# Patient Record
Sex: Male | Born: 1965 | Race: White | Hispanic: No | Smoking: Never smoker
Health system: Southern US, Community
[De-identification: ages and names within clinical notes are randomized; demographics above are authoritative.]

## PROBLEM LIST (undated history)

## (undated) DIAGNOSIS — B1920 Unspecified viral hepatitis C without hepatic coma: Secondary | ICD-10-CM

## (undated) DIAGNOSIS — I10 Essential (primary) hypertension: Secondary | ICD-10-CM

## (undated) DIAGNOSIS — K7460 Unspecified cirrhosis of liver: Secondary | ICD-10-CM

## (undated) DIAGNOSIS — D3A8 Other benign neuroendocrine tumors: Secondary | ICD-10-CM

## (undated) HISTORY — DX: Unspecified viral hepatitis C without hepatic coma: B19.20

## (undated) HISTORY — DX: Unspecified cirrhosis of liver: K74.60

## (undated) HISTORY — PX: OTHER SURGICAL HISTORY: SHX169

## (undated) HISTORY — DX: Essential (primary) hypertension: I10

## (undated) HISTORY — DX: Other benign neuroendocrine tumors: D3A.8

---

## 2013-04-19 ENCOUNTER — Encounter (INDEPENDENT_AMBULATORY_CARE_PROVIDER_SITE_OTHER): Payer: Self-pay

## 2013-04-19 ENCOUNTER — Encounter: Payer: Self-pay | Admitting: *Deleted

## 2013-04-19 ENCOUNTER — Ambulatory Visit (INDEPENDENT_AMBULATORY_CARE_PROVIDER_SITE_OTHER): Payer: 59 | Admitting: Cardiovascular Disease

## 2013-04-19 VITALS — BP 143/100 | HR 97 | Ht 75.0 in | Wt 372.0 lb

## 2013-04-19 DIAGNOSIS — R079 Chest pain, unspecified: Secondary | ICD-10-CM

## 2013-04-19 DIAGNOSIS — Z139 Encounter for screening, unspecified: Secondary | ICD-10-CM

## 2013-04-19 DIAGNOSIS — Z833 Family history of diabetes mellitus: Secondary | ICD-10-CM

## 2013-04-19 DIAGNOSIS — I10 Essential (primary) hypertension: Secondary | ICD-10-CM

## 2013-04-19 MED ORDER — LISINOPRIL 10 MG PO TABS: 10.0000 mg | ORAL_TABLET | Freq: Every day | ORAL | Status: DC

## 2013-04-19 NOTE — Progress Notes (Signed)
Patient ID: Nicolas [name], male   DOB: 09/19/1965, 48 y.o.   MRN: [id number]       CARDIOLOGY CONSULT NOTE  Patient ID: Nicolas [name] MRN: [id number] DOB/AGE: 20-Aug-1965 48 y.o.  Admit date: (Not on file) Primary Physician Narda Bonds, MD  Reason for Consultation: chest pain, HTN  HPI: The patient is a 48 year old male who presents for the evaluation of chest pain. He was evaluated at an urgent care center in [place], [place]. An ECG there showed normal sinus rhythm at a rate of 71 beats per minute with no acute ST segment or T-wave abnormalities. His chest pain was right sided and located specifically in the right inframammary and right axillary region. It occurred after lifting 5 lb dumbbells repetitively. He previously used to weigh over 400 pounds and after changing his diet and exercising, he is now down to 372 pounds. He has never experienced chest pain prior to this. He denies shortness of breath, palpitations, leg swelling and syncope. He occasionally gets lightheaded if his blood pressure is high. He was given a nitroglycerin patch at the urgent care which gave him headaches. He had numerous labs checked with results as follows:  WBC 8.6 Hgb 15.9 Plts 235 Albumin 3.6 K 4.2 BUN 15 Creatinine 0.79 BNP 13 Troponin .01 D dimer 0.31 AST 60 ALT 70  He was given a prescription for lisinopril 10 mg daily but has yet to fill it. He does not have a primary care provider.  Soc: has 3 daughters and 1 son. Denies cigarette smoking but uses snuff. Works as a Building control surveyor.  No Known Allergies  Current Outpatient Prescriptions  Medication Sig Dispense Refill  . aspirin EC 81 MG tablet Take 81 mg by mouth as needed.       No current facility-administered medications for this visit.    Past Medical History  Diagnosis Date  . HTN (hypertension)     No past surgical history on file.  History   Social History  . Marital Status: Divorced    Spouse Name: N/A   Number of Children: N/A  . Years of Education: N/A   Occupational History  . Not on file.   Social History Main Topics  . Smoking status: Never Smoker   . Smokeless tobacco: Current User    Types: Snuff     Comment: started using snuff since 1986  . Alcohol Use: Not on file  . Drug Use: Not on file  . Sexual Activity: Not on file   Other Topics Concern  . Not on file   Social History Narrative  . No narrative on file     No family history on file.   Prior to Admission medications   Medication Sig Start Date End Date Taking? Authorizing Provider  aspirin EC 81 MG tablet Take 81 mg by mouth as needed.   Yes Historical Provider, MD     Review of systems complete and found to be negative unless listed above in HPI     Physical exam Blood pressure 143/100, pulse 97, height 6\' 3"  (1.905 m), weight 372 lb (168.738 kg), SpO2 97.00%. General: NAD, obese Neck: No JVD, no thyromegaly or thyroid nodule.  Lungs: Clear to auscultation bilaterally with normal respiratory effort. CV: Nondisplaced PMI.  Tenderness to palpation (mild) in right axillary region. Heart regular S1/S2, no S3/S4, no murmur.  No peripheral edema.  No carotid bruit.  Normal pedal pulses.  Abdomen: Soft, nontender, no hepatosplenomegaly, no distention.  Skin: Intact without lesions or  rashes.  Neurologic: Alert and oriented x 3.  Psych: Normal affect. Extremities: No clubbing or cyanosis.  HEENT: Normal.   Labs:   No results found for this basename: WBC, HGB, HCT, MCV, PLT   No results found for this basename: NA, K, CL, CO2, BUN, CREATININE, CALCIUM, LABALBU, PROT, BILITOT, ALKPHOS, ALT, AST, GLUCOSE,  in the last 168 hours No results found for this basename: CKTOTAL, CKMB, CKMBINDEX, TROPONINI    No results found for this basename: CHOL   No results found for this basename: HDL   No results found for this basename: LDLCALC   No results found for this basename: TRIG   No results found for this  basename: CHOLHDL   No results found for this basename: LDLDIRECT       EKG: Sinus rhythm, rate 71 bpm, axis within normal limits, intervals within normal limits, no acute ST-T wave changes, rSR' pattern in V1.  Studies: No results found.  ASSESSMENT AND PLAN:  1. Hypertension: I encouraged him to begin taking lisinopril 10 mg daily as prescribed. I will assess his renal function with a basic metabolic panel in one week. I informed him of possible side effects which may include a dry cough. I have asked the patient to check blood pressure readings 2-3 times per week, at different times throughout the day, in order to get a better approximation of mean BP values. These results will be provided to me at the end of that period so that I can determine if antihypertensive medication titration is indicated (has the ability to check BP at work). 2. Chest pain: noncardiac and musculoskeletal in etiology, specifically triggered by repetitive weight lifting. 3. Screening: I will check an HbA1C given his family history (daughter with elevated blood sugar) and a fasting lipid panel.  Dispo: f/u 1 week.  Signed: Kate Sable, M.D., F.A.C.C.  04/19/2013, 1:37 PM

## 2013-04-19 NOTE — Patient Instructions (Signed)
Continue all current medications. Labs for HgbA1c, Lipid panel, BMET - do in 1 week, around 04/26/2013 Office will contact with results via phone or letter.   Your physician has requested that you regularly monitor and record your blood pressure readings at home. Please check at varied times of the day and bring for MD review at next office visit.  Follow up in  4-6 weeks

## 2013-04-28 ENCOUNTER — Telehealth: Payer: Self-pay | Admitting: Cardiovascular Disease

## 2013-04-28 NOTE — Telephone Encounter (Signed)
Left message to return call 

## 2013-05-01 NOTE — Telephone Encounter (Signed)
Patient states that new medication was making him feel lightheaded, dizzy, & dropping heart rate too low.  Stated he stopped the Lisinopril himself on Friday & is feeling better.  Stated he had a lot more readings, but did not have access to those at work.  Numbers below are what he took at home:  1/14 - 138/78  89 1/15 - 120/78  151 1/16 - 132/78  68 1/19 - 119/72  72

## 2013-05-01 NOTE — Telephone Encounter (Signed)
If experiencing side effects from lisinopril, can try amlodipine 5 mg daily. Ask him to continue to monitor BP.

## 2013-05-03 ENCOUNTER — Encounter: Payer: Self-pay | Admitting: *Deleted

## 2013-05-03 MED ORDER — AMLODIPINE BESYLATE 5 MG PO TABS: 5.0000 mg | ORAL_TABLET | Freq: Every day | ORAL | Status: DC

## 2013-05-03 NOTE — Telephone Encounter (Signed)
Notified girlfriend Debbora Lacrosse).  She will relay message to Joann to see if willing to try something different.

## 2013-05-03 NOTE — Telephone Encounter (Signed)
Received return call - patient is okay with trying new medication.  Will send new medication to pharm.

## 2013-05-10 ENCOUNTER — Telehealth: Payer: Self-pay | Admitting: Cardiovascular Disease

## 2013-05-10 NOTE — Telephone Encounter (Signed)
Discussed with girlfriend.  She describes area being just on his left big toe red, swollen.  Explained to her that these are not typical reactions to Amlodipine.  Suggested that he may need to be seen by urgent care as he does not have PMD.  She states his BP has been fine since he stopped the medicine himself yesterday.  Will send to provider for advice.

## 2013-05-10 NOTE — Telephone Encounter (Signed)
Sounds like gout. Would continue amlodipine as he has documented HTN.

## 2013-05-11 NOTE — Telephone Encounter (Addendum)
Notified girlfriend Mickel Baas).  Advised her to have him keep bp log & bring with him to follow up visit on 2/10 with Dr. Bronson Ing if he chooses not to restart the Amlodipine.  She stated he has concerns that he is going to have stents.  Informed her that per MD last OV, no indication that he needed any further testing at this point.  Informed her that she may want to come with him for next office visit as all these things can be discussed in greater detail with MD at that visit.  She verbalized understanding.

## 2013-05-23 ENCOUNTER — Encounter: Payer: Self-pay | Admitting: Cardiovascular Disease

## 2013-05-23 ENCOUNTER — Encounter: Payer: Self-pay | Admitting: *Deleted

## 2013-05-23 ENCOUNTER — Ambulatory Visit (INDEPENDENT_AMBULATORY_CARE_PROVIDER_SITE_OTHER): Payer: 59 | Admitting: Cardiovascular Disease

## 2013-05-23 VITALS — BP 133/92 | HR 82 | Ht 75.0 in | Wt 361.0 lb

## 2013-05-23 DIAGNOSIS — R079 Chest pain, unspecified: Secondary | ICD-10-CM

## 2013-05-23 DIAGNOSIS — Z139 Encounter for screening, unspecified: Secondary | ICD-10-CM

## 2013-05-23 DIAGNOSIS — Z7182 Exercise counseling: Secondary | ICD-10-CM

## 2013-05-23 DIAGNOSIS — Z833 Family history of diabetes mellitus: Secondary | ICD-10-CM

## 2013-05-23 DIAGNOSIS — I10 Essential (primary) hypertension: Secondary | ICD-10-CM

## 2013-05-23 NOTE — Progress Notes (Signed)
Patient ID: Nicolas [name], male   DOB: May 22, 1965, 48 y.o.   MRN: [id number]      SUBJECTIVE: The patient has a history of hypertension and musculoskeletal chest pain. I initially started him on lisinopril but he began to experience lightheadedness and dizziness and stopped this. I then recommended he start amlodipine 5 mg daily. His basic metabolic panel showed BUN 24 creatinine 0.94 on 04/28/2013. His HbA1c was 5.7%. Lipid panel showed triglycerides 95, total cholesterol 177, HDL 53, LDL 105. Since his last visit, he has gone from 372 pounds to 361 pounds. He walks and lifts dumbbells and does a few situps every other day. He has cut out fast foods altogether and is eating baked foods. He also has been eating a lot of [place]. He denies chest pain and shortness of breath. He did not tolerate amlodipine either, as this caused him to be lightheaded and dizzy. He has been monitoring his blood pressures at home and other than one isolated reading of 149/89, all other readings have been normal.     No Known Allergies  No current outpatient prescriptions on file.   No current facility-administered medications for this visit.    Past Medical History  Diagnosis Date  . HTN (hypertension)     No past surgical history on file.  History   Social History  . Marital Status: Divorced    Spouse Name: N/A    Number of Children: N/A  . Years of Education: N/A   Occupational History  . Not on file.   Social History Main Topics  . Smoking status: Never Smoker   . Smokeless tobacco: Current User    Types: Snuff     Comment: started using snuff since 1986  . Alcohol Use: Not on file  . Drug Use: Not on file  . Sexual Activity: Not on file   Other Topics Concern  . Not on file   Social History Narrative  . No narrative on file     Filed Vitals:   05/23/13 1008  BP: 133/92  Pulse: 82  Height: 6\' 3"  (1.905 m)  Weight: 361 lb (163.749 kg)  SpO2: 96%    PHYSICAL  EXAM General: NAD Neck: No JVD, no thyromegaly or thyroid nodule.  Lungs: Clear to auscultation bilaterally with normal respiratory effort. CV: Nondisplaced PMI.  Heart regular S1/S2, no S3/S4, no murmur.  No peripheral edema.  No carotid bruit.  Normal pedal pulses.  Abdomen: Soft, nontender, no hepatosplenomegaly, no distention.  Neurologic: Alert and oriented x 3.  Psych: Normal affect. Extremities: No clubbing or cyanosis.   ECG: reviewed and available in electronic records.      ASSESSMENT AND PLAN: 1. HTN: Given that he has had only one isolated reading of 149/89 with all other readings being normal, I will hold off on any antihypertensive treatments. I gave him extensive counseling with respect to exercise and dietary strategies to achieve a heart healthy lifestyle. I've asked him to call me in 3 months to let me know how he is doing and to inform me of the progress he has made. 2. Musculoskeletal chest pain: no further recurrences. 3. Labs: HbA1C, renal function, and lipids within normal limits. Continue lifestyle risk factor modification.  Dispo: f/u prn   Kate Sable, M.D., F.A.C.C.

## 2013-05-23 NOTE — Patient Instructions (Signed)
Continue all current medications. Follow up as needed  Back to work noted provided today.

## 2019-07-27 ENCOUNTER — Ambulatory Visit: Payer: Managed Care, Other (non HMO) | Admitting: Nurse Practitioner

## 2019-07-27 NOTE — Progress Notes (Signed)
Referring [facility] Primary Care Physician:  Olin Hauser, MD Primary Gastroenterologist:  Dr. Gala Romney  Chief Complaint  Patient presents with  . Abdominal Pain     RUQ, across lower abd  . abdominal swelling    HPI:   Nicolas [name] is a 54 y.o. male presenting today at the request of [facility] ED for abdominal pain.   Reviewed hospital records dated 07/24/2019: Patient presented with swelling that had developed over the last month in his abdomen, scrotum, and bilateral legs.  CT abdomen and pelvis with liver cirrhosis with splenomegaly, large volume of ascites throughout the abdomen.  Possible mild nodular infiltration of the left upper quadrant mesentery and anterior omentum.  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  Mass is indeterminate for metastatic disease or primary mesenteric mass such as stromal tumor.  Though ascites and appearance of the mesentery could be secondary to cirrhosis, consideration is also given towards peritoneal metastatic disease.  Nonobstructing kidney stones.    CBC with no significant abnormalities.  CMP with glucose slightly elevated at 137, albumin low at 2.9, slight elevation of alk phos at 113, slight elevation of AST of 43, slight elevation of total bilirubin at 1.2. BNP within normal limits.  Lipase normal.  He was started on Lasix 40 mg daily.  Advised to follow-up with GI, oncology, and primary medical doctor.  Today:  States he can't do anything. SOB with minimal activity. Swelling in abdomen, scrotum, and legs began to be very noticeable about 1 month ago. Not sure of his baseline weight before this. States he can't eat very much due to the swelling. Will eat one jello and feel full. Drinking ensure. No nausea or vomiting. Mild abdominal pain in RUQ and RLQ since swelling began. No significant change. BMs daily. No blood in the stool or black stools. Was  taking Aleve daily but stopped this after ED visit. Occasional baby aspirin. Taking Lasix 40 mg daily prescribed by ED. Sometimes swelling in LE will improve, other times not.   No tylenol. No chest pain or heart palpitations. Some cough since swelling. Intermittent dizziness, no pre-syncope or syncope.   Cirrhosis: This is a new diagnosis. Patient was unaware of this. Drinks 12 beer in a year. Used to drink heavily in his 77s. No history of drug use. Sister had cirrhosis but she was on drugs. No known exposures to hepatitis. Has tattoos completed at home at 54 y.o.. No incarceration. No autoimmune conditions in his family. No known family history of alpha-1 antitrypsin deficiency, Wilson's disease, celiac disease, inflammatory bowel disease, thyroid disorders.  No jaundice. No confusion.   No prior colonoscopy.   Patient did not receive referral to oncology.    Past Medical History:  Diagnosis Date  . HTN (hypertension)     History reviewed. No pertinent surgical history.  Current Outpatient Medications  Medication Sig Dispense Refill  . furosemide (LASIX) 40 MG tablet Take 40 mg by mouth daily.    Marland Kitchen spironolactone (ALDACTONE) 100 MG tablet Take 1 tablet (100 mg total) by mouth daily. 30 tablet 3   No current facility-administered medications for this visit.    Allergies as of 07/28/2019  . (No Known Allergies)    Family History  Problem Relation Age of Onset  . Cirrhosis Sister        per patient, due to drug use.     Social History   Socioeconomic History  .  Marital status: Divorced    Spouse name: Not on file  . Number of children: Not on file  . Years of education: Not on file  . Highest education level: Not on file  Occupational History  . Not on file  Tobacco Use  . Smoking status: Never Smoker  . Smokeless tobacco: Current User    Types: Snuff  . Tobacco comment: started using snuff since 1986  Substance and Sexual Activity  . Alcohol use: Yes    Comment:  12 beer a year.   . Drug use: Never  . Sexual activity: Not on file  Other Topics Concern  . Not on file  Social History Narrative  . Not on file   Social Determinants of Health   Financial Resource Strain:   . Difficulty of Paying Living Expenses:   Food Insecurity:   . Worried About Charity fundraiser in the Last Year:   . Arboriculturist in the Last Year:   Transportation Needs:   . Film/video editor (Medical):   Marland Kitchen Lack of Transportation (Non-Medical):   Physical Activity:   . Days of Exercise per Week:   . Minutes of Exercise per Session:   Stress:   . Feeling of Stress :   Social Connections:   . Frequency of Communication with Friends and Family:   . Frequency of Social Gatherings with Friends and Family:   . Attends Religious Services:   . Active Member of Clubs or Organizations:   . Attends Archivist Meetings:   Marland Kitchen Marital Status:   Intimate Partner Violence:   . Fear of Current or Ex-Partner:   . Emotionally Abused:   Marland Kitchen Physically Abused:   . Sexually Abused:     Review of Systems: Gen: Denies any fever, chills, cold or flulike symptoms. CV: Denies chest pain or heart palpitations. Resp: See HPI GI: See HPI GU : Denies urinary burning, urinary frequency, urinary hesitancy  Derm: Denies rash Psych: Denies depression, anxiety Heme: See HPI  Physical Exam: BP (!) 156/98   Pulse 94   Temp 97.8 F (36.6 C) (Oral)   Ht _0  (1.956 m)   Wt (!) 471 lb (213.6 kg)   BMI 55.85 kg/m  General:   Alert and oriented. Pleasant and cooperative. Difficulty ambulating due to swelling with associated SOB.  Head:  Normocephalic and atraumatic. Eyes:  Without icterus, sclera clear and conjunctiva pink.  Ears:  Normal auditory acuity. Lungs:  Clear to auscultation bilaterally. No wheezes, rales, or rhonchi. No distress.  Heart:  S1, S2 present without murmurs appreciated.  Abdomen: Large A/P diameter. Abdomen is quite distended and tense. Also with 1+  pitting edema in the lower abdomen. +BS but diminished. Mild tenderness in the RLQ and RUQ.  No guarding or rebound. No masses appreciated.  Rectal:  Deferred  Msk:  Symmetrical without gross deformities. Normal posture. Extremities:  With 3+ bilateral LE pitting edema up to his knees. 1+ pitting edema in his thighs.  Neurologic:  Alert and  oriented x4;  grossly normal neurologically. Skin:  Intact without significant lesions or rashes. Psych: Normal mood and affect.

## 2019-07-28 ENCOUNTER — Other Ambulatory Visit (HOSPITAL_COMMUNITY)
Admission: RE | Admit: 2019-07-28 | Discharge: 2019-07-28 | Disposition: A | Discharge status: Home / Self Care | Payer: Medicaid - Out of State | Source: Ambulatory Visit | Attending: Gastroenterology | Admitting: Gastroenterology

## 2019-07-28 ENCOUNTER — Telehealth: Payer: Self-pay | Admitting: Gastroenterology

## 2019-07-28 ENCOUNTER — Ambulatory Visit (HOSPITAL_COMMUNITY)
Admission: RE | Admit: 2019-07-28 | Discharge: 2019-07-28 | Disposition: A | Discharge status: Home / Self Care | Payer: Medicaid - Out of State | Source: Ambulatory Visit | Attending: Gastroenterology | Admitting: Gastroenterology

## 2019-07-28 ENCOUNTER — Other Ambulatory Visit: Payer: Self-pay

## 2019-07-28 ENCOUNTER — Encounter: Payer: Self-pay | Admitting: Gastroenterology

## 2019-07-28 ENCOUNTER — Encounter (HOSPITAL_COMMUNITY): Payer: Self-pay

## 2019-07-28 ENCOUNTER — Ambulatory Visit (INDEPENDENT_AMBULATORY_CARE_PROVIDER_SITE_OTHER): Payer: Medicaid - Out of State | Admitting: Gastroenterology

## 2019-07-28 VITALS — BP 156/98 | HR 94 | Temp 97.8°F | Ht 77.0 in | Wt >= 6400 oz

## 2019-07-28 DIAGNOSIS — R188 Other ascites: Secondary | ICD-10-CM | POA: Insufficient documentation

## 2019-07-28 DIAGNOSIS — K6389 Other specified diseases of intestine: Secondary | ICD-10-CM

## 2019-07-28 DIAGNOSIS — K7460 Unspecified cirrhosis of liver: Secondary | ICD-10-CM | POA: Diagnosis not present

## 2019-07-28 LAB — BODY FLUID CELL COUNT WITH DIFFERENTIAL
Eos, Fluid: 0 %
Lymphs, Fluid: 66 %
Monocyte-Macrophage-Serous Fluid: 28 % — ABNORMAL LOW (ref 50–90)
Neutrophil Count, Fluid: 6 % (ref 0–25)
Total Nucleated Cell Count, Fluid: 327 cu mm (ref 0–1000)

## 2019-07-28 LAB — IRON AND TIBC
Iron: 99 ug/dL (ref 45–182)
Saturation Ratios: 41 % — ABNORMAL HIGH (ref 17.9–39.5)
TIBC: 242 ug/dL — ABNORMAL LOW (ref 250–450)
UIBC: 143 ug/dL

## 2019-07-28 LAB — HEPATITIS C ANTIBODY: HCV Ab: REACTIVE — AB

## 2019-07-28 LAB — PROTIME-INR
INR: 1.2 (ref 0.8–1.2)
Prothrombin Time: 14.6 seconds (ref 11.4–15.2)

## 2019-07-28 LAB — GRAM STAIN

## 2019-07-28 LAB — HEPATITIS A ANTIBODY, TOTAL: hep A Total Ab: NONREACTIVE

## 2019-07-28 LAB — HEPATITIS B SURFACE ANTIGEN: Hepatitis B Surface Ag: NONREACTIVE

## 2019-07-28 LAB — HEPATITIS B CORE ANTIBODY, TOTAL: Hep B Core Total Ab: NONREACTIVE

## 2019-07-28 LAB — HEPATITIS B SURFACE ANTIBODY,QUALITATIVE: Hep B S Ab: NONREACTIVE

## 2019-07-28 LAB — FERRITIN: Ferritin: 166 ng/mL (ref 24–336)

## 2019-07-28 MED ORDER — SPIRONOLACTONE 100 MG PO TABS: 100.0000 mg | ORAL_TABLET | Freq: Every day | ORAL | 3 refills | Status: DC

## 2019-07-28 NOTE — Procedures (Signed)
PreOperative Dx: Cirrhosis, ascites Postoperative Dx: Cirrhosis, ascites Procedure:   US guided paracentesis Radiologist:  Thornton Papas Anesthesia:  10 ml of1% lidocaine Specimen:  4.2 L of cloudy yellow ascitic fluid EBL:   < 1 ml Complications: None

## 2019-07-28 NOTE — Telephone Encounter (Signed)
Nicolas [name], patient needs BMP on 4/21 or 4/22. Not sure if this was arranged when saw him on 4/16.

## 2019-07-28 NOTE — Progress Notes (Signed)
Paracentesis complete no signs of distress.  

## 2019-07-28 NOTE — Assessment & Plan Note (Addendum)
54 year old male with history of HTN and obesity presenting with significant swelling in his  abdomen, scrotum, and lower extremities which began to be very noticeable about 1 month ago.  Associated shortness of breath and inability to eat much due to pressure on his abdomen.  Was seen at [facility] 07/24/2019.  CT abdomen and pelvis revealed liver cirrhosis with splenomegaly, large volume ascites, possible mild nodular infiltration of the left upper quadrant mesentery and anterior omentum, and 5.3 mesenteric mass.  Labs with CBC without significant abnormalities.  CMP with albumin 2.9 (L), alk phos 113 (H), AST 43 (H),, ALT normal, total bilirubin 1.2 (H).   Cirrhosis is new diagnosis for patient.  Denies jaundice, bruising or bleeding, or confusion. No bright red blood per rectum or melena. Drank alcohol heavily in his 79s, otherwise about 12 beers a year.  No history of drug use.  Home tattoos at age 60.  No personal family history of autoimmune conditions.  Exam with significantly distended tense abdomen with mild TTP in RUQ and RLQ, 1+ pitting edema in the lower abdomen, 3+ bilateral lower extremity pitting edema up to his knees and 1+ pitting edema in his thighs.  Suspect cirrhosis is likely secondary to NAFLD/NASH; however, will complete additional work-up to evaluate for hepatitis, alpha-1 antitrypsin deficiency, Wilson's disease, hemochromatosis, and autoimmune etiologies. Significant ascites likely related to cirrhosis; however, can't rule out malignant ascites with reported mesenteric mass.   Due to significant peripheral edema/anasarca with shortness of breath, recommended patient to proceed to the emergency room for admission as I feel he needs IV diuresis.  Patient explained that he could not be admitted to the hospital at this time but may be able to go to the hospital tomorrow.  Recommended he proceed to the ED tomorrow. In the meantime, we will go ahead and arrange for stat paracentesis  today and start him on spironolactone.  Stat Paracentesis today. Max 4 L. Albumin per protocol. Fluid analysis: Fluid cell count with diff, culture, cytology, gram stain, AFb. Labs: AFP, INR, hepatitis A antibody total, hepatitis B surface antigen, hepatitis B surface antibody, hepatitis B core antibody, hepatitis C antibody, iron panel, ceruloplasmin, alpha-1 antitrypsin phenotype, immunoglobulins, ANA, ASMA, AMA. Continue Lastix 40 mg daily.  Start Spironolactone 100 mg daily. Recommended to start tomorrow as he is having paracentesis today.  Low-sodium diet.  No more than 2 g daily. Add protein shakes daily. Recheck of BMP next week. He will need EGD in the future to evaluate for esophageal varices.  Stat referral to oncology for further evaluation of mesenteric mass. Follow-up in 2 weeks.  Stressed the importance that he needs to proceed to emergency room tomorrow or at minimum, if anything were to worsen over the weekend, he must go to the ED. He voiced his understanding.

## 2019-07-28 NOTE — Assessment & Plan Note (Signed)
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  Also with possible mild nodular infiltration of the left upper quadrant mesentery and anterior omentum, new diagnosis of liver cirrhosis with splenomegaly, and large volume ascites throughout the abdomen.   Patient currently has significant abdominal distention, tenderness palpation in the RLQ and RUQ, 1+ pitting edema in the lower abdomen, 3+ bilateral lower extremity pitting edema up to his knees, 1+ pitting edema in his thighs.  I suspect his swelling is likely secondary to cirrhosis but cannot rule out ascites secondary to malignancy.  Placing a stat referral to oncology. Cirrhosis evaluation/management addressed above. We will follow-up with him in 2 weeks for his cirrhosis.

## 2019-07-28 NOTE — Patient Instructions (Addendum)
Please go over to [facility] to have your labs and paracentesis completed.  A paracentesis is a procedure to draw fluid off of your abdomen.  I feel you need to be in the hospital for IV medications to help with your swelling. I understand you can not go today. I would recommend you go tomorrow if possible.   If you have any worsening of shortness of breath, swelling in your abdomen, or abdominal pain, it is essential that you proceed to the emergency room.  I am sending in spironolactone (Aldactone) 100 mg.  Start this tomorrow and take this every morning with your Lasix 40 mg.   You also need to have your kidney function and electrolytes rechecked next week on Wednesday or Thursday.  We will arrange for that as well.   Be sure you are following a low-sodium diet.  No more than 2 g daily.  This includes all foods and beverages.  Try increasing your protein intake through protein shakes.  Your protein is low at this time which will also contribute to your swelling.  We are also sending a stat referral over to oncology for further evaluation of the mass reported on CT.  We will see back in the office in 2 weeks.  Call with questions or concerns prior.  Aliene Altes, PA-C [facility]       Low-Sodium Eating Plan Sodium, which is an element that makes up salt, helps you maintain a healthy balance of fluids in your body. Too much sodium can increase your blood pressure and cause fluid and waste to be held in your body. Your health care provider or dietitian may recommend following this plan if you have high blood pressure (hypertension), kidney disease, liver disease, or heart failure. Eating less sodium can help lower your blood pressure, reduce swelling, and protect your heart, liver, and kidneys. What are tips for following this plan? General guidelines  Most people on this plan should limit their sodium intake to 1,500-2,000 mg (milligrams) of sodium each  day. Reading food labels   The Nutrition Facts label lists the amount of sodium in one serving of the food. If you eat more than one serving, you must multiply the listed amount of sodium by the number of servings.  Choose foods with less than 140 mg of sodium per serving.  Avoid foods with 300 mg of sodium or more per serving. Shopping  Look for lower-sodium products, often labeled as "low-sodium" or "no salt added."  Always check the sodium content even if foods are labeled as "unsalted" or "no salt added".  Buy fresh foods. ? Avoid canned foods and premade or frozen meals. ? Avoid canned, cured, or processed meats  Buy breads that have less than 80 mg of sodium per slice. Cooking  Eat more home-cooked food and less restaurant, buffet, and fast food.  Avoid adding salt when cooking. Use salt-free seasonings or herbs instead of table salt or sea salt. Check with your health care provider or pharmacist before using salt substitutes.  Cook with plant-based oils, such as canola, sunflower, or olive oil. Meal planning  When eating at a restaurant, ask that your food be prepared with less salt or no salt, if possible.  Avoid foods that contain MSG (monosodium glutamate). MSG is sometimes added to [place] food, bouillon, and some canned foods. What foods are recommended? The items listed may not be a complete list. Talk with your dietitian about what dietary choices are best for you.  Grains Low-sodium cereals, including oats, puffed wheat and rice, and shredded wheat. Low-sodium crackers. Unsalted rice. Unsalted pasta. Low-sodium bread. Whole-grain breads and whole-grain pasta. Vegetables Fresh or frozen vegetables. "No salt added" canned vegetables. "No salt added" tomato sauce and paste. Low-sodium or reduced-sodium tomato and vegetable juice. Fruits Fresh, frozen, or canned fruit. Fruit juice. Meats and other protein foods Fresh or frozen (no salt added) meat, poultry, seafood,  and fish. Low-sodium canned tuna and salmon. Unsalted nuts. Dried peas, beans, and lentils without added salt. Unsalted canned beans. Eggs. Unsalted nut butters. Dairy Milk. Soy milk. Cheese that is naturally low in sodium, such as ricotta cheese, fresh mozzarella, or Swiss cheese Low-sodium or reduced-sodium cheese. Cream cheese. Yogurt. Fats and oils Unsalted butter. Unsalted margarine with no trans fat. Vegetable oils such as canola or olive oils. Seasonings and other foods Fresh and dried herbs and spices. Salt-free seasonings. Low-sodium mustard and ketchup. Sodium-free salad dressing. Sodium-free light mayonnaise. Fresh or refrigerated horseradish. Lemon juice. Vinegar. Homemade, reduced-sodium, or low-sodium soups. Unsalted popcorn and pretzels. Low-salt or salt-free chips. What foods are not recommended? The items listed may not be a complete list. Talk with your dietitian about what dietary choices are best for you. Grains Instant hot cereals. Bread stuffing, pancake, and biscuit mixes. Croutons. Seasoned rice or pasta mixes. Noodle soup cups. Boxed or frozen macaroni and cheese. Regular salted crackers. Self-rising flour. Vegetables Sauerkraut, pickled vegetables, and relishes. Olives. [place] fries. Onion rings. Regular canned vegetables (not low-sodium or reduced-sodium). Regular canned tomato sauce and paste (not low-sodium or reduced-sodium). Regular tomato and vegetable juice (not low-sodium or reduced-sodium). Frozen vegetables in sauces. Meats and other protein foods Meat or fish that is salted, canned, smoked, spiced, or pickled. Bacon, ham, sausage, hotdogs, corned beef, chipped beef, packaged lunch meats, salt pork, jerky, pickled herring, anchovies, regular canned tuna, sardines, salted nuts. Dairy Processed cheese and cheese spreads. Cheese curds. Blue cheese. Feta cheese. String cheese. Regular cottage cheese. Buttermilk. Canned milk. Fats and oils Salted butter. Regular  margarine. Ghee. Bacon fat. Seasonings and other foods Onion salt, garlic salt, seasoned salt, table salt, and sea salt. Canned and packaged gravies. Worcestershire sauce. Tartar sauce. Barbecue sauce. Teriyaki sauce. Soy sauce, including reduced-sodium. Steak sauce. Fish sauce. Oyster sauce. Cocktail sauce. Horseradish that you find on the shelf. Regular ketchup and mustard. Meat flavorings and tenderizers. Bouillon cubes. Hot sauce and Tabasco sauce. Premade or packaged marinades. Premade or packaged taco seasonings. Relishes. Regular salad dressings. Salsa. Potato and tortilla chips. Corn chips and puffs. Salted popcorn and pretzels. Canned or dried soups. Pizza. Frozen entrees and pot pies. Summary  Eating less sodium can help lower your blood pressure, reduce swelling, and protect your heart, liver, and kidneys.  Most people on this plan should limit their sodium intake to 1,500-2,000 mg (milligrams) of sodium each day.  Canned, boxed, and frozen foods are high in sodium. Restaurant foods, fast foods, and pizza are also very high in sodium. You also get sodium by adding salt to food.  Try to cook at home, eat more fresh fruits and vegetables, and eat less fast food, canned, processed, or prepared foods. This information is not intended to replace advice given to you by your health care provider. Make sure you discuss any questions you have with your health care provider. Document Revised: 03/12/2017 Document Reviewed: 03/23/2016 Elsevier Patient Education  2020 [employer].

## 2019-07-29 LAB — IGM: IgM (Immunoglobulin M), Srm: 167 mg/dL (ref 20–172)

## 2019-07-29 LAB — IGG: IgG (Immunoglobin G), Serum: 3158 mg/dL — ABNORMAL HIGH (ref 603–1613)

## 2019-07-29 LAB — CERULOPLASMIN: Ceruloplasmin: 32.1 mg/dL — ABNORMAL HIGH (ref 16.0–31.0)

## 2019-07-29 LAB — ANTI-SMOOTH MUSCLE ANTIBODY, IGG: F-Actin IgG: 11 Units (ref 0–19)

## 2019-07-29 LAB — AFP TUMOR MARKER: AFP, Serum, Tumor Marker: 14.2 ng/mL — ABNORMAL HIGH (ref 0.0–8.3)

## 2019-07-29 LAB — IGA: IgA: 993 mg/dL — ABNORMAL HIGH (ref 90–386)

## 2019-07-29 LAB — ANA: Anti Nuclear Antibody (ANA): NEGATIVE

## 2019-07-30 LAB — MITOCHONDRIAL ANTIBODIES: Mitochondrial M2 Ab, IgG: <20 Units (ref 0.0–20.0)

## 2019-07-31 ENCOUNTER — Other Ambulatory Visit: Payer: Self-pay

## 2019-07-31 ENCOUNTER — Encounter: Payer: Self-pay | Admitting: Internal Medicine

## 2019-07-31 DIAGNOSIS — K7460 Unspecified cirrhosis of liver: Secondary | ICD-10-CM

## 2019-07-31 DIAGNOSIS — Z79899 Other long term (current) drug therapy: Secondary | ICD-10-CM

## 2019-07-31 LAB — ACID FAST SMEAR (AFB, MYCOBACTERIA): Acid Fast Smear: NEGATIVE

## 2019-07-31 LAB — CYTOLOGY - NON PAP

## 2019-08-01 LAB — ALPHA-1 ANTITRYPSIN PHENOTYPE: A-1 Antitrypsin, Ser: 181 mg/dL (ref 101–187)

## 2019-08-01 NOTE — Telephone Encounter (Signed)
Lab orders were faxed to [facility] on 08/01/19. Pt is aware of labs needed.

## 2019-08-02 ENCOUNTER — Inpatient Hospital Stay (HOSPITAL_COMMUNITY): Payer: Medicaid - Out of State

## 2019-08-02 ENCOUNTER — Other Ambulatory Visit: Payer: Self-pay

## 2019-08-02 ENCOUNTER — Inpatient Hospital Stay (HOSPITAL_COMMUNITY): Payer: Medicaid - Out of State | Attending: Hematology | Admitting: Hematology

## 2019-08-02 ENCOUNTER — Encounter (HOSPITAL_COMMUNITY): Payer: Self-pay | Admitting: Hematology

## 2019-08-02 ENCOUNTER — Encounter (HOSPITAL_COMMUNITY): Payer: Self-pay | Admitting: Surgery

## 2019-08-02 VITALS — BP 137/75 | HR 77 | Temp 97.5°F | Resp 28 | Ht 77.0 in | Wt >= 6400 oz

## 2019-08-02 DIAGNOSIS — K7460 Unspecified cirrhosis of liver: Secondary | ICD-10-CM | POA: Insufficient documentation

## 2019-08-02 DIAGNOSIS — R161 Splenomegaly, not elsewhere classified: Secondary | ICD-10-CM

## 2019-08-02 DIAGNOSIS — R1909 Other intra-abdominal and pelvic swelling, mass and lump: Secondary | ICD-10-CM | POA: Insufficient documentation

## 2019-08-02 DIAGNOSIS — R935 Abnormal findings on diagnostic imaging of other abdominal regions, including retroperitoneum: Secondary | ICD-10-CM

## 2019-08-02 DIAGNOSIS — R188 Other ascites: Secondary | ICD-10-CM | POA: Diagnosis not present

## 2019-08-02 DIAGNOSIS — K6389 Other specified diseases of intestine: Secondary | ICD-10-CM

## 2019-08-02 DIAGNOSIS — R748 Abnormal levels of other serum enzymes: Secondary | ICD-10-CM | POA: Diagnosis not present

## 2019-08-02 LAB — CBC WITH DIFFERENTIAL/PLATELET
Abs Immature Granulocytes: 0.02 10*3/uL (ref 0.00–0.07)
Basophils Absolute: 0 10*3/uL (ref 0.0–0.1)
Basophils Relative: 1 %
Eosinophils Absolute: 0.1 10*3/uL (ref 0.0–0.5)
Eosinophils Relative: 2 %
HCT: 45.6 % (ref 39.0–52.0)
Hemoglobin: 14.8 g/dL (ref 13.0–17.0)
Immature Granulocytes: 0 %
Lymphocytes Relative: 27 %
Lymphs Abs: 1.5 10*3/uL (ref 0.7–4.0)
MCH: 34.3 pg — ABNORMAL HIGH (ref 26.0–34.0)
MCHC: 32.5 g/dL (ref 30.0–36.0)
MCV: 105.8 fL — ABNORMAL HIGH (ref 80.0–100.0)
Monocytes Absolute: 0.5 10*3/uL (ref 0.1–1.0)
Monocytes Relative: 9 %
Neutro Abs: 3.4 10*3/uL (ref 1.7–7.7)
Neutrophils Relative %: 61 %
Platelets: 165 10*3/uL (ref 150–400)
RBC: 4.31 MIL/uL (ref 4.22–5.81)
RDW: 13.6 % (ref 11.5–15.5)
WBC: 5.6 10*3/uL (ref 4.0–10.5)
nRBC: 0 % (ref 0.0–0.2)

## 2019-08-02 LAB — LACTATE DEHYDROGENASE: LDH: 188 U/L (ref 98–192)

## 2019-08-02 LAB — CULTURE, BODY FLUID W GRAM STAIN -BOTTLE: Culture: NO GROWTH

## 2019-08-02 NOTE — Progress Notes (Signed)
[facility] NOTE  Patient Care Team: Olin Hauser, MD as PCP - General Rourk, Cristopher Estimable, MD as Consulting Physician (Gastroenterology)  CHIEF COMPLAINTS/PURPOSE OF CONSULTATION:  Mesenteric mass.  HISTORY OF PRESENTING ILLNESS:  Nicolas [name] 54 y.o. male is seen in consultation today at the request of Aliene Altes from GI for further work-up and management of mesenteric mass.  This patient reported weight gain of 200 pounds in the last 5 years.  His functional status also declined to the extent that he had to stop working.  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  He also has exposure to MEK (Methyl ethyl ketone).  He denies any fevers or night sweats.  He noticed swelling in his scrotum and went to the hospital in [place] on 07/24/2019.  CT scan of the abdomen and pelvis on 07/25/2019 showed nodular liver consistent with cirrhosis and splenomegaly measuring 16 cm.  Large volume ascites within the abdomen and pelvis with hazy edematous appearance of the mesentery.  5.3 cm solid appearing mesenteric mass was seen with possible mild nodular infiltration of the left upper quadrant mesentery and anterior omentum.  Multiple small mesenteric root nodes were seen.  He was then referred to GI for further evaluation.  He does not have any history of excessive consumption of alcohol.  Family history significant for sister who died of cirrhosis but was a drug addict.  No clear history of malignancy in the family.  He reportedly stopped working as a Building control surveyor 6 years ago.  He was also a non-smoker.  Denies any history of colonoscopy.  He had ultrasound-guided paracentesis done on 07/28/2019.  He was also started on Lasix and spironolactone was added.  He is only taking Lasix.  He is usually confined to his chair and gets short winded easily if he walks.  MEDICAL HISTORY:  Past Medical History:  Diagnosis Date  . HTN (hypertension)     SURGICAL HISTORY: Past  Surgical History:  Procedure Laterality Date  . debridement  Right    per pt, debridement of rt hand    SOCIAL HISTORY: Social History   Socioeconomic History  . Marital status: Divorced    Spouse name: Not on file  . Number of children: 4  . Years of education: Not on file  . Highest education level: Not on file  Occupational History  . Occupation: unemployed  Tobacco Use  . Smoking status: Never Smoker  . Smokeless tobacco: Current User    Types: Snuff  . Tobacco comment: started using snuff since 1986  Substance and Sexual Activity  . Alcohol use: Yes    Comment: occasional  . Drug use: Never  . Sexual activity: Not Currently  Other Topics Concern  . Not on file  Social History Narrative  . Not on file   Social Determinants of Health   Financial Resource Strain: Low Risk   . Difficulty of Paying Living Expenses: Not very hard  Food Insecurity: No Food Insecurity  . Worried About Charity fundraiser in the Last Year: Never true  . Ran Out of Food in the Last Year: Never true  Transportation Needs: Unmet Transportation Needs  . Lack of Transportation (Medical): Yes  . Lack of Transportation (Non-Medical): Yes  Physical Activity: Inactive  . Days of Exercise per Week: 0 days  . Minutes of Exercise per Session: 0 min  Stress: Stress Concern Present  . Feeling of Stress : To some extent  Social Connections: Moderately Isolated  . Frequency of Communication with Friends and Family: Three times a week  . Frequency of Social Gatherings with Friends and Family: Three times a week  . Attends Religious Services: Never  . Active Member of Clubs or Organizations: No  . Attends Archivist Meetings: Never  . Marital Status: Divorced  Human resources officer Violence: Not At Risk  . Fear of Current or Ex-Partner: No  . Emotionally Abused: No  . Physically Abused: No  . Sexually Abused: No    FAMILY HISTORY: Family History  Problem Relation Age of Onset  .  Cirrhosis Sister        per patient, due to drug use.   . Alzheimer's disease Mother   . Cancer Mother   . Diabetes Mother   . Congestive Heart Failure Father   . Diabetes Father   . Alzheimer's disease Paternal Grandmother   . Osteoporosis Paternal Grandmother   . Diabetes Daughter     ALLERGIES:  has No Known Allergies.  MEDICATIONS:  Current Outpatient Medications  Medication Sig Dispense Refill  . furosemide (LASIX) 40 MG tablet Take 40 mg by mouth daily.    Marland Kitchen spironolactone (ALDACTONE) 100 MG tablet Take 1 tablet (100 mg total) by mouth daily. 30 tablet 3   No current facility-administered medications for this visit.    REVIEW OF SYSTEMS:   Constitutional: Denies fevers, chills or abnormal night sweats Eyes: Denies blurriness of vision, double vision or watery eyes Ears, nose, mouth, throat, and face: Denies mucositis or sore throat Respiratory: Dyspnea on exertion present. Cardiovascular: Denies palpitation, chest discomfort.  Bilateral lower extremity swelling present. Gastrointestinal:  Denies nausea, heartburn or change in bowel habits Skin: Denies abnormal skin rashes Lymphatics: Denies new lymphadenopathy or easy bruising Neurological:Denies numbness, tingling or new weaknesses Behavioral/Psych: Mood is stable, no new changes  All other systems were reviewed with the patient and are negative.  PHYSICAL EXAMINATION: ECOG PERFORMANCE STATUS: 2 - Symptomatic, <50% confined to bed  Vitals:   08/02/19 1247  BP: 137/75  Pulse: 77  Resp: (!) 28  Temp: (!) 97.5 F (36.4 C)  SpO2: 97%   Filed Weights   08/02/19 1247  Weight: (!) 461 lb 6.4 oz (209.3 kg)    GENERAL:alert, no distress and comfortable SKIN: skin color, texture, turgor are normal, no rashes or significant lesions EYES: normal, conjunctiva are pink and non-injected, sclera clear OROPHARYNX:no exudate, no erythema and lips, buccal mucosa, and tongue normal  NECK: supple, thyroid normal size,  non-tender, without nodularity LYMPH:  no palpable lymphadenopathy in the cervical, axillary or inguinal LUNGS: clear to auscultation and percussion with normal breathing effort HEART: regular rate & rhythm and no murmurs.  Anasarca with lower extremity edema present. ABDOMEN: Soft, somewhat tense, obese with erythema.  No clearly palpable masses. Musculoskeletal:no cyanosis of digits and no clubbing  PSYCH: alert & oriented x 3 with fluent speech NEURO: no focal motor/sensory deficits  LABORATORY DATA:  I have reviewed the data as listed No results found for: WBC, HGB, HCT, MCV, PLT   Chemistry   No results found for: NA, K, CL, CO2, BUN, CREATININE, GLU No results found for: CALCIUM, ALKPHOS, AST, ALT, BILITOT     RADIOGRAPHIC STUDIES: I have personally reviewed the radiological images as listed and agreed with the findings in the report. US Paracentesis  Result Date: 07/28/2019 INDICATION: Cirrhosis, ascites EXAM: ULTRASOUND GUIDED DIAGNOSTIC AND THERAPEUTIC PARACENTESIS MEDICATIONS: None COMPLICATIONS: None immediate PROCEDURE: Informed written consent was  obtained from the patient after a discussion of the risks, benefits and alternatives to treatment. A timeout was performed prior to the initiation of the procedure. Initial ultrasound scanning demonstrates a large amount of ascites within the right lower abdominal quadrant. The right lower abdomen was prepped and draped in the usual sterile fashion. 1% lidocaine was used for local anesthesia. Following this, a 5 [place] Yueh catheter was introduced. An ultrasound image was saved for documentation purposes. The paracentesis was performed. The catheter was removed and a dressing was applied. The patient tolerated the procedure well without immediate post procedural complication. FINDINGS: A total of approximately 4.2 L of cloudy yellow ascitic fluid was removed. Samples were sent to the laboratory as requested by the clinical team.  IMPRESSION: Successful ultrasound-guided paracentesis yielding 4.2 liters of peritoneal fluid. Electronically Signed   By: Lavonia Dana M.D.   On: 07/28/2019 11:52    ASSESSMENT & PLAN:  Mesenteric mass 1.  Mesenteric mass: -Patient presented to [facility] on 07/24/2019 with lower extremity swelling and abdominal distention. -Labs showed elevated liver enzymes and total bilirubin. -CT scan of the abdomen and pelvis on 07/25/2019 showed splenomegaly measuring 16 cm, nodular liver consistent with cirrhosis.  Large volume ascites within the abdomen and pelvis.  Hazy edematous appearance of the mesentery.  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  Possible mild nodular infiltration of the left upper quadrant mesentery and anterior omentum.  Multiple small mesenteric root nodes. -He is being evaluated for cirrhosis by GI.  He does not have any history of excessive consumption of alcohol.  He reportedly weighed around 260 pounds about 5 years ago.  Denies any fevers or night sweats. 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  Exposure to MEK present.  Also could have exposure to asbestos. -Ultrasound paracentesis on 07/28/2019 with 4.2 L of cloudy yellow ascitic fluid was removed.  Cytology was negative for malignancy.  AFP was elevated at 14.2. -I have recommended LDH and a CEA level today.  We will also schedule him for PET CT scan. -I will see him back after the PET CT scan and arrange for biopsy if it is positive.  2.  Cirrhosis and ascites: -He is on Lasix 40 mg and spironolactone 100 mg daily.  But he is reportedly taking only Lasix. -I have told him to start taking spironolactone along with Lasix.  Orders Placed This Encounter  Procedures  . NM PET Image Initial (PI) Skull Base To Thigh    Standing Status:   Future    Standing Expiration Date:   08/01/2020    Order Specific Question:   ** REASON FOR EXAM (FREE TEXT)    Answer:   mesenteric mass    Order Specific Question:   If  indicated for the ordered procedure, I authorize the administration of a radiopharmaceutical per Radiology protocol    Answer:   Yes    Order Specific Question:   Preferred imaging location?    Answer:   External    Order Specific Question:   Radiology Contrast Protocol - do NOT remove file path    Answer:   [email or web address]  . Lactate dehydrogenase    Standing Status:   Future    Standing Expiration Date:   08/01/2020  . CBC with Differential    Standing Status:   Future    Standing Expiration Date:   08/01/2020  . Comprehensive metabolic panel    Standing Status:   Future    Standing  Expiration Date:   08/01/2020  . CEA    Standing Status:   Future    Standing Expiration Date:   08/01/2020    All questions were answered. The patient knows to call the clinic with any problems, questions or concerns.     Derek Jack, MD 08/02/2019 1:54 PM

## 2019-08-02 NOTE — Assessment & Plan Note (Addendum)
1.  Mesenteric mass: -Patient presented to [facility] on 07/24/2019 with lower extremity swelling and abdominal distention. -Labs showed elevated liver enzymes and total bilirubin. -CT scan of the abdomen and pelvis on 07/25/2019 showed splenomegaly measuring 16 cm, nodular liver consistent with cirrhosis.  Large volume ascites within the abdomen and pelvis.  Hazy edematous appearance of the mesentery.  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  Possible mild nodular infiltration of the left upper quadrant mesentery and anterior omentum.  Multiple small mesenteric root nodes. -He is being evaluated for cirrhosis by GI.  He does not have any history of excessive consumption of alcohol.  He reportedly weighed around 260 pounds about 5 years ago.  Denies any fevers or night sweats. 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  Exposure to MEK present.  Also could have exposure to asbestos. -Ultrasound paracentesis on 07/28/2019 with 4.2 L of cloudy yellow ascitic fluid was removed.  Cytology was negative for malignancy.  AFP was elevated at 14.2. -I have recommended LDH and a CEA level today.  We will also schedule him for PET CT scan. -I will see him back after the PET CT scan and arrange for biopsy if it is positive.  2.  Cirrhosis and ascites: -He is on Lasix 40 mg and spironolactone 100 mg daily.  But he is reportedly taking only Lasix. -I have told him to start taking spironolactone along with Lasix.

## 2019-08-02 NOTE — Patient Instructions (Addendum)
[place] at [facility] Discharge Instructions  You were seen today by Dr. Delton Coombes. He went over your history, family history and how you've been feeling lately. Start taking both fluid pills. You will have labs drawn today before you leave. He will schedule you for a PET scan, which will be done in [place]. He will see you back after your scan for follow up.   Thank you for choosing [place] at [facility] to provide your oncology and hematology care.  To afford each patient quality time with our provider, please arrive at least 15 minutes before your scheduled appointment time.   If you have a lab appointment with the [facility] please come in thru the  Main Entrance and check in at the main information desk  You need to re-schedule your appointment should you arrive 10 or more minutes late.  We strive to give you quality time with our providers, and arriving late affects you and other patients whose appointments are after yours.  Also, if you no show three or more times for appointments you may be dismissed from the clinic at the providers discretion.     Again, thank you for choosing [facility].  Our hope is that these requests will decrease the amount of time that you wait before being seen by our physicians.       _____________________________________________________________  Should you have questions after your visit to [facility], please contact our office at [phone] between the hours of 8:00 a.m. and 4:30 p.m.  Voicemails left after 4:00 p.m. will not be returned until the following business day.  For prescription refill requests, have your pharmacy contact our office and allow 72 hours.    Cancer Center Support Programs:   > Cancer Support Group  2nd Tuesday of the month 1pm-2pm, Journey Room

## 2019-08-03 LAB — CEA: CEA: 1.2 ng/mL (ref 0.0–4.7)

## 2019-08-06 NOTE — Progress Notes (Signed)
No malignant cells on cytology. No growth on culture x 5 days.

## 2019-08-06 NOTE — Progress Notes (Signed)
Autoimmune workup with elevated IgG and IgA. 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 This does not tell us much other than we need to do some additional testing to rule out Wilson disease (a condition where too much copper builds up in the body). Iron panel with slightly elevated percent saturation. Would recommend we also go ahead and complete additional testing to rule out hemochromatosis to be complete (a condition where too much iron builds up in the body).   He does not have Hep A or B. No immunity to Hep A or B either. Hep C Ab is positive. This means he could have Hep C or may have had Hep C in the past and cleared it on his on. We will need to complete additional testing for this as well.   AFP is elevated at 14.2. This is a tumor marker that we monitor. He has already established care with Dr. Delton Coombes with oncology who is aware of this finding. He is currently scheduled for PET scan on 5/3.   Plan/Recommendations:  1. Hep C RNA Quantitative. Please arrange.  2. Hemochromatosis DNA labs. Please arrange.  3. 24 hour urine copper. Please arrange.  4. He still needs to complete BMP that was to be updated last week. This is to monitor kidney function and electrolytes as we started him on Aldactone at his visit on 4/16.

## 2019-08-09 ENCOUNTER — Encounter (HOSPITAL_COMMUNITY): Payer: Self-pay | Admitting: *Deleted

## 2019-08-09 NOTE — Progress Notes (Signed)
I spoke with patient today and advised him of his upcoming PET scan scheduled at [facility].  It is scheduled for 5/7 at 2pm.  He is to arrive at 1:30.  The day before he should have a low carb diet, no alcohol, no strenuous activity, can have cheese, meat, nuts, eggs.  Morning of exam he can have protein heavy breakfast but must be before 7 am and only sips of water after that point until exam.   Patient verbalized directions and understanding.

## 2019-08-13 NOTE — Progress Notes (Signed)
Referring Provider: Olin Hauser, MD Primary Care Physician:  Olin Hauser, MD Primary GI Physician: Dr. Gala Romney  Chief Complaint  Patient presents with  . Cirrhosis    f/u    HPI:   Nicolas [name] is a 54 y.o. male presenting for cirrhosis follow-up.  Patient was last seen in our office 07/28/2019 at the time of initial consult for the same.  He had been seen in [facility] ED on 07/24/2019 for persistent swelling in his abdomen, scrotum, and legs bilaterally.  CT A/P revealed liver cirrhosis with splenomegaly, large volume ascites, possible nodular infiltration of left upper quadrant mesentery and anterior omentum.  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  He will start on Lasix 40 mg daily.  At the time of his office visit, patient reported cirrhosis was a new diagnosis for him.  He reported significant shortness of breath with minimal activity and persistent swelling that had become noticeable about 1 month ago.  Unknown baseline weight.  Early satiety.  Mild abdominal pain in the RUQ and LUQ since swelling began.  No nausea/vomiting.  No BRBPR, melena, jaundice, or confusion.  Reported heavy drinking in his 39s; since then, 12 beers per year.  No history of drug use.  Tattoos completed at home at age 1.  No incarceration.  No autoimmune conditions.  Sister with cirrhosis with history of drug use.  No other family history of liver disease.  Exam with 3+ bilateral lower extremity pitting edema up to his knees and 1+ pitting edema in his thighs, 1+ pitting edema in the lower abdomen.  I felt patient likely needed inpatient IV diuresis; however, patient stated he would not be able to go to the ED that day but may be able to go over the weekend.  Plans for stat paracentesis with fluid analysis, labs to evaluate other etiologies of cirrhosis, AFP, INR, start spironolactone 100 mg in addition to Lasix, low-sodium diet, protein  shakes daily, recheck BMP in 1 week.  Would need EGD in the future.  Referred to oncology.  Plan to follow-up in 2 weeks.  Para completed 07/28/2019 yielding 4.2 L.  No SBP.  No organisms on Gram stain.  Predominantly mononuclear WBCs.  No malignant cells on cytology.  No growth on culture x5 days.  Labs 07/28/2019: Autoimmune work-up with elevated IgG and IgA.  Ceruloplasmin slightly elevated at 32.1.  Iron panel was slightly elevated percent saturation.  No immunity to hepatitis A or B.  No prior hepatitis B.  Hepatitis C antibody positive.  AFP elevated at 14.2.  *Recommended hep C RNA quantitative, hemochromatosis DNA, 24-hour urine copper, and repeat BMP.  We were unable to get in touch with the patient and labs were not completed.  Letter was mailed.   Office visit with oncology 08/02/2019.  Recommended LDH, CEA, PET scan.  Consider biopsy if PET scan is positive.  PET scan scheduled for 5/7. LDH and CEA were within normal limits.  Today:  Down 30 lbs since last visit (2 weeks ago).  Taking Lasix and spironolactone. Ran out of Lasix yesterday. Still has spironolactone.   Still feels pretty distended in his abdomen but notes this has improved.  States his abdomen was rubbing the steering wheel when driving to his last visit and now he has a 2 to 3 inch gap between his abdomen and steering wheel.  Swelling in the lower legs has improved.  Swelling in  scrotum has resolved. When first having fluid drained off, he felt much better. Not sure if needle made his abdomen sore, but he is having mild discomfort in the lower abdomen.  Also with mild discomfort in the RUQ which has been present for the last 1.5 months. No significant change. No confusion. No yellowing of eyes or skin. Urine is somewhat dark in the mornings.  Gets more clear as the day goes on.  Early satiety is improving and he is eating somewhat better. Can eat small meals TID rather than just Jell-O.   Reports having a chronic tremor for the  last 4-5 years.  Not sure what is causing this.  No nausea or vomiting. No GERD symptoms. No dysaphia. BMs daily. No constipation or diarrhea. No blood in the stool or black stool.   No alcohol currently.   No prior colonoscopy.  No family history of colon cancer.  Prefers to go ahead and have colonoscopy at the time of EGD.  Doesn't want to go to the hospital.   Past Medical History:  Diagnosis Date  . HTN (hypertension)     Past Surgical History:  Procedure Laterality Date  . debridement  Right    per pt, debridement of rt hand    Current Outpatient Medications  Medication Sig Dispense Refill  . spironolactone (ALDACTONE) 100 MG tablet Take 1 tablet (100 mg total) by mouth daily. 30 tablet 3  . furosemide (LASIX) 40 MG tablet Take 1 tablet (40 mg total) by mouth daily. 30 tablet 3   No current facility-administered medications for this visit.    Allergies as of 08/14/2019  . (No Known Allergies)    Family History  Problem Relation Age of Onset  . Cirrhosis Sister        per patient, due to drug use.   . Alzheimer's disease Mother   . Cancer Mother   . Diabetes Mother   . Congestive Heart Failure Father   . Diabetes Father   . Alzheimer's disease Paternal Grandmother   . Osteoporosis Paternal Grandmother   . Diabetes Daughter   . Colon cancer Neg Hx     Social History   Socioeconomic History  . Marital status: Divorced    Spouse name: Not on file  . Number of children: 4  . Years of education: Not on file  . Highest education level: Not on file  Occupational History  . Occupation: unemployed  Tobacco Use  . Smoking status: Never Smoker  . Smokeless tobacco: Current User    Types: Snuff  . Tobacco comment: started using snuff since 1986  Substance and Sexual Activity  . Alcohol use: Not Currently    Comment: occasional  . Drug use: Never  . Sexual activity: Not Currently  Other Topics Concern  . Not on file  Social History Narrative  . Not on  file   Social Determinants of Health   Financial Resource Strain: Low Risk   . Difficulty of Paying Living Expenses: Not very hard  Food Insecurity: No Food Insecurity  . Worried About Charity fundraiser in the Last Year: Never true  . Ran Out of Food in the Last Year: Never true  Transportation Needs: Unmet Transportation Needs  . Lack of Transportation (Medical): Yes  . Lack of Transportation (Non-Medical): Yes  Physical Activity: Inactive  . Days of Exercise per Week: 0 days  . Minutes of Exercise per Session: 0 min  Stress: Stress Concern Present  . Feeling of Stress :  To some extent  Social Connections: Moderately Isolated  . Frequency of Communication with Friends and Family: Three times a week  . Frequency of Social Gatherings with Friends and Family: Three times a week  . Attends Religious Services: Never  . Active Member of Clubs or Organizations: No  . Attends Archivist Meetings: Never  . Marital Status: Divorced    Review of Systems: Gen: Denies fever, chills, lightheadedness, dizziness, presyncope, syncope. CV: Denies chest pain or palpitations Resp: Shortness of breath significantly improved.  Still with some shortness of breath with exertion but none at rest.  Wheezing improved.  Occasional cough when laying down.   GI: See HPI Derm: Denies rash  Psych: Denies depression or anxiety.  He does state his emotions are up and down.  Heme: Denies bruising or bleeding  Physical Exam: BP (!) 152/91   Pulse 88   Temp (!) 97.3 F (36.3 C) (Oral)   Ht 6\' 5"  (1.956 m)   Wt (!) 441 lb 9.6 oz (200.3 kg)   BMI 52.37 kg/m  General:   Alert and oriented.   Pleasant and cooperative. No distress noted.  Appears much more comfortable at today's visit. Head:  Normocephalic and atraumatic. Eyes:  Conjuctiva clear without scleral icterus. Heart:  S1, S2 present without murmurs appreciated. Lungs:  Clear to auscultation bilaterally. No wheezes, rales, or rhonchi. No  distress.  Abdomen:  +BS.  Abdomen is distended.  Somewhat tense but notably softer compared to last visit.  Mild tenderness palpation in the RUQ and RLQ without change since last visit.  1+ pitting edema in the lower abdomen. No rebound or guarding. No appreciable HSM or masses although this would be difficult to detect considering significant distention. Msk:  Symmetrical without gross deformities. Normal posture. Extremities:  With 1+ bilateral pitting edema in the thighs and 3+ bilateral pitting edema below the knees.  No appreciable asterixis. Neurologic:  Alert and  oriented x4 Psych: Normal mood and affect.

## 2019-08-14 ENCOUNTER — Ambulatory Visit (INDEPENDENT_AMBULATORY_CARE_PROVIDER_SITE_OTHER): Payer: Medicaid - Out of State | Admitting: Gastroenterology

## 2019-08-14 ENCOUNTER — Encounter (HOSPITAL_COMMUNITY): Admission: RE | Admit: 2019-08-14 | Payer: Medicaid - Out of State | Source: Ambulatory Visit

## 2019-08-14 ENCOUNTER — Other Ambulatory Visit: Payer: Self-pay | Admitting: *Deleted

## 2019-08-14 ENCOUNTER — Encounter: Payer: Self-pay | Admitting: *Deleted

## 2019-08-14 ENCOUNTER — Telehealth: Payer: Self-pay | Admitting: Gastroenterology

## 2019-08-14 ENCOUNTER — Encounter: Payer: Self-pay | Admitting: Gastroenterology

## 2019-08-14 ENCOUNTER — Other Ambulatory Visit: Payer: Self-pay

## 2019-08-14 VITALS — BP 152/91 | HR 88 | Temp 97.3°F | Ht 77.0 in | Wt >= 6400 oz

## 2019-08-14 DIAGNOSIS — R768 Other specified abnormal immunological findings in serum: Secondary | ICD-10-CM | POA: Diagnosis not present

## 2019-08-14 DIAGNOSIS — Z1211 Encounter for screening for malignant neoplasm of colon: Secondary | ICD-10-CM | POA: Diagnosis not present

## 2019-08-14 DIAGNOSIS — R188 Other ascites: Secondary | ICD-10-CM | POA: Diagnosis not present

## 2019-08-14 DIAGNOSIS — K7460 Unspecified cirrhosis of liver: Secondary | ICD-10-CM | POA: Diagnosis not present

## 2019-08-14 MED ORDER — FUROSEMIDE 40 MG PO TABS: 40.0000 mg | ORAL_TABLET | Freq: Every day | ORAL | 3 refills | Status: DC

## 2019-08-14 NOTE — Assessment & Plan Note (Signed)
Hepatitis C antibody + 07/28/2019.  No hepatitis C RNA.  Risk factors include home tattoos received at age 54.  Denies history of drug use or incarceration.  No annual hepatitis C exposure.  Complete HCVRNA quantitative. Advised to ensure no uncommon to contact with his blood.  If he has a blood still, he is to clean it up with a mixture of bleach and water.  Also advised to avoid sharing personal equipment including toothbrushes and razors with anyone. Further recommendations to follow.

## 2019-08-14 NOTE — Assessment & Plan Note (Addendum)
54 year old male with history of hypertension and obesity as well as newly diagnosed cirrhosis via CT scan completed 07/24/2019.  Also with splenomegaly, large-volume ascites, possible mild nodular infiltration of the left upper quadrant mesentery and anterior omentum, and 5.3 mesenteric mass.  Albumin slightly low at 2.9.  LFTs with alk phos 113 (H), AST 43 (H), ALT normal, total bilirubin 1.2 (H).  At patient's initial visit with [place] 07/28/2019, he reported significant swelling in his abdomen, scrotum, lower extremities x1 month.  Started on Lasix 40 mg and spironolactone 100 mg.  Paracentesis 4/16 yielding 4.2 L.  No SBP.  No malignant cells on cytology.  ANA, ASMA, AMA within normal limits.  IgG and IgA elevated.  Ceruloplasmin elevated at 32.1.  Iron panel with slightly elevated percent saturation at 41%.  Hepatitis C antibody positive.  No hep A/B.  No immunity to hep A/B.  AFP elevated at 14.2.  Patient established with oncology due to mesenteric mass with plans for PET scan 08/18/2019.  Since his last visit, he continues with significant lower extremity and abdominal swelling but feels this has actually improved quite a bit.  Weight is down 30 pounds over the last 2 weeks.  Shortness of breath has improved.  Early satiety improved.  Denies confusion, yellowing of the eyes or skin.  Urine is somewhat dark in the mornings but clear as the day goes on.  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  No regular alcohol use since his 41s.  No drug use.  Home tattoos at age 57. Exam with large distended abdomen but less tense compared to 2 weeks ago. Still with 1+ pitting edema in lower abdomen, 1+ pitting edema in his thighs, and 3+ bilateral lower extremity pitting edema up to his knees.  No asterixis.  Query whether cirrhosis is secondary to chronic hepatitis C vs NASH. Will also need to rule out Wilson's disease and hemochromatosis.  With elevated AFP, concern for malignancy.   Considered liver MRI but patient is already scheduled for PET scan and we will await for these results.  Considering significant peripheral edema/anasarca (although improved), I still feel patient would likely benefit from IV diuretics; however, patient still declines hospitalization.  Will likely need to increase his diuretics but he did not complete repeat labs since adding spironolactone.  Plan: Update CMP. Check ammonia to ensure hand tremor is not related to this. HCV RNA quantitative 24-hour urine copper Hemochromatosis DNA-PCR Continue Lasix 40 mg and spironolactone 100 mg daily.  We will likely increase this following updated kidney function electrolytes. Advised to continue following a strict low-sodium diet.  No more than 2 g daily. Repeat paracentesis 08/21/2019 (patient requested this date).  Hold diuretics day of paracentesis. Continue to avoid alcohol. No more than 2 g Tylenol daily. Obtain hepatitis A and B vaccinations from primary care provider. Arrange EGD with propofol with Dr. Gala Romney for esophageal varices screening. The risks, benefits, and alternatives have been discussed in detail with patient. They have stated understanding and desire to proceed.  Follow-up on PET scan results to determine if any other imaging is needed to evaluate elevated AFP. Follow-up in 2 weeks.

## 2019-08-14 NOTE — Assessment & Plan Note (Signed)
54 year old male with history of HTN, obesity, newly diagnosed cirrhosis April 2021 with splenomegaly and complicated by ascites/significant peripheral edema/anasarca as addressed above.  Also with mesenteric mass also identified in April 2021 currently following with oncology with plans for PET scan 08/18/2019.  No prior colonoscopy.  No significant lower GI symptoms.  No bright red blood per rectum or melena.  No family history of colon cancer.  Patient needs EGD for esophageal varices screening in the setting of cirrhosis.  He desires to have colonoscopy at the same time of EGD.   Other procedures are currently scheduled out for a few months, I feel it is reasonable to go ahead and get patient added to the list for colonoscopy to be performed at the time of EGD.  We will likely have multiple visits between now and then for management of his cirrhosis.   Proceed with colonoscopy with propofol in the near future with Dr. Gala Romney. The risks, benefits, and alternatives have been discussed in detail with patient. They have stated understanding and desire to proceed.  Propofol due to BMI. Follow-up in 2 weeks for cirrhosis care.

## 2019-08-14 NOTE — Patient Instructions (Addendum)
Please have labs completed today or tomorrow at [facility] lab.   We will arrange her to have a paracentesis on Monday, 08/21/2019.  If you feel you need this sooner, please let me know. On the day of your paracentesis, do not take Lasix or spironolactone.  We will arrange for you to have an upper endoscopy and colonoscopy with Dr. Gala Romney.  Continue taking Lasix 40 mg every morning.  I have sent in a refill. Continue taking spironolactone 100 mg every morning with Lasix.  Continue following a low-sodium diet.  No more than 2 g of sodium daily.  This includes all food and liquid intake.  You need to have hepatitis A and B vaccinations.  Please see your primary care about this.  No more than 2 g of Tylenol daily.  Continue to avoid alcohol.  Regarding hepatitis C, ensure no one comes into contact with your blood.  Should you have a blood spill, clean this up with bleach and water mixture.  Avoid sharing personal equipment including toothbrushes or razors with anyone.  We will see back in 2 weeks.   Aliene Altes, PA-C [facility]

## 2019-08-14 NOTE — Telephone Encounter (Signed)
FYI, this patient's follow-up will need to be in person.

## 2019-08-15 ENCOUNTER — Other Ambulatory Visit: Payer: Self-pay | Admitting: Emergency Medicine

## 2019-08-15 ENCOUNTER — Other Ambulatory Visit (HOSPITAL_COMMUNITY): Payer: Self-pay | Admitting: Nurse Practitioner

## 2019-08-15 DIAGNOSIS — K7460 Unspecified cirrhosis of liver: Secondary | ICD-10-CM

## 2019-08-15 DIAGNOSIS — R188 Other ascites: Secondary | ICD-10-CM

## 2019-08-15 NOTE — Telephone Encounter (Signed)
Noted  

## 2019-08-15 NOTE — Progress Notes (Signed)
cmp

## 2019-08-16 ENCOUNTER — Other Ambulatory Visit: Payer: Self-pay | Admitting: Gastroenterology

## 2019-08-16 DIAGNOSIS — E7220 Disorder of urea cycle metabolism, unspecified: Secondary | ICD-10-CM

## 2019-08-16 LAB — COMPREHENSIVE METABOLIC PANEL
ALT: 26 IU/L (ref 0–44)
AST: 36 IU/L (ref 0–40)
Albumin/Globulin Ratio: 0.5 — ABNORMAL LOW (ref 1.2–2.2)
Albumin: 2.8 g/dL — ABNORMAL LOW (ref 3.8–4.9)
Alkaline Phosphatase: 98 IU/L (ref 39–117)
BUN/Creatinine Ratio: 11 (ref 9–20)
BUN: 9 mg/dL (ref 6–24)
Bilirubin Total: 1.1 mg/dL (ref 0.0–1.2)
CO2: 27 mmol/L (ref 20–29)
Calcium: 9 mg/dL (ref 8.7–10.2)
Chloride: 99 mmol/L (ref 96–106)
Creatinine, Ser: 0.84 mg/dL (ref 0.76–1.27)
GFR calc Af Amer: 115 mL/min/{1.73_m2} (ref >59)
GFR calc non Af Amer: 99 mL/min/{1.73_m2} (ref >59)
Globulin, Total: 5.2 g/dL — ABNORMAL HIGH (ref 1.5–4.5)
Glucose: 93 mg/dL (ref 65–99)
Potassium: 4.6 mmol/L (ref 3.5–5.2)
Sodium: 138 mmol/L (ref 134–144)
Total Protein: 8 g/dL (ref 6.0–8.5)

## 2019-08-16 LAB — HCV RNA QUANT
HCV log10: 5.639 log10 IU/mL
Hepatitis C Quantitation: 436000 IU/mL

## 2019-08-16 LAB — AMMONIA: Ammonia: 65 ug/dL (ref 40–200)

## 2019-08-16 MED ORDER — LACTULOSE 20 GM/30ML PO SOLN: 30.0000 mL | Freq: Two times a day (BID) | ORAL | 2 refills | Status: DC

## 2019-08-16 NOTE — Progress Notes (Signed)
LFTs, kidney function, and electrolytes within normal limits. (MELD low at 9 based on prior INR)  I would like to increase his diuretic medications to help with his fluid overload.  Would recommend increasing to spironolactone 200 mg and Lasix 80 mg daily.  This can be dosed in a split dose.  He can take spironolactone 100 mg and Lasix 40 mg first thing in the morning and repeat the dose at 4 PM.  Hopefully this will prevent him from being up all night going to the bathroom. I will send in a Rx once he is aware and agreeable. Please let me know.   I know there was a mixup in his other labs that were ordered at office visit.  Can we please double check on these and ensure they are getting completed.  *Additionally, with increasing his diuretics, it is imperative he have his kidney function and electrolytes rechecked in 1 week. Please arrange BMP.

## 2019-08-16 NOTE — Progress Notes (Signed)
Ammonia is slightly elevated. He denied any mental status change at his last visit. He did report long history of bilateral hand tremor. Not sure if this is related to his elevated ammonia but it is possible.   Would recommend we start Lactulose to decrease his ammonia. May also help with hand tremor. Lactulose can cause loose stools/diarrhea. He will need to take 54mL 2 times daily. Goal of 2-3 soft stools daily. I am sending in a Rx.   Additionally, he does have hepatitis C. Hep C RNA is positive. We need to check the genotype. Please arrange.

## 2019-08-17 ENCOUNTER — Other Ambulatory Visit: Payer: Self-pay | Admitting: *Deleted

## 2019-08-17 ENCOUNTER — Telehealth: Payer: Self-pay | Admitting: *Deleted

## 2019-08-17 ENCOUNTER — Ambulatory Visit (HOSPITAL_COMMUNITY): Payer: Managed Care, Other (non HMO) | Admitting: Hematology

## 2019-08-17 ENCOUNTER — Other Ambulatory Visit: Payer: Self-pay | Admitting: Gastroenterology

## 2019-08-17 DIAGNOSIS — K7460 Unspecified cirrhosis of liver: Secondary | ICD-10-CM

## 2019-08-17 DIAGNOSIS — R188 Other ascites: Secondary | ICD-10-CM

## 2019-08-17 DIAGNOSIS — R768 Other specified abnormal immunological findings in serum: Secondary | ICD-10-CM

## 2019-08-17 MED ORDER — FUROSEMIDE 40 MG PO TABS: ORAL_TABLET | ORAL | 2 refills | Status: DC

## 2019-08-17 MED ORDER — SPIRONOLACTONE 100 MG PO TABS: ORAL_TABLET | ORAL | 2 refills | Status: DC

## 2019-08-17 MED ORDER — NA SULFATE-K SULFATE-MG SULF 17.5-3.13-1.6 GM/177ML PO SOLN: 1.0000 | Freq: Once | ORAL | 0 refills | Status: AC

## 2019-08-17 NOTE — Telephone Encounter (Signed)
Patient called in. He is scheduled for TCS/EGD with propofol with RMR 6/3 at 12:15pm. Patient aware will send in Rx for prep to pharmacy. Will mail prep instructions with pre-op/covid test appt.

## 2019-08-21 ENCOUNTER — Encounter: Payer: Self-pay | Admitting: *Deleted

## 2019-08-21 ENCOUNTER — Other Ambulatory Visit: Payer: Self-pay

## 2019-08-21 ENCOUNTER — Ambulatory Visit (HOSPITAL_COMMUNITY)
Admission: RE | Admit: 2019-08-21 | Discharge: 2019-08-21 | Disposition: A | Discharge status: Home / Self Care | Payer: Medicaid - Out of State | Source: Ambulatory Visit | Attending: Gastroenterology | Admitting: Gastroenterology

## 2019-08-21 ENCOUNTER — Ambulatory Visit (HOSPITAL_COMMUNITY): Admission: RE | Admit: 2019-08-21 | Payer: Medicaid - Out of State | Source: Ambulatory Visit

## 2019-08-21 ENCOUNTER — Encounter (HOSPITAL_COMMUNITY): Payer: Self-pay

## 2019-08-21 DIAGNOSIS — R188 Other ascites: Secondary | ICD-10-CM | POA: Diagnosis not present

## 2019-08-21 LAB — BODY FLUID CELL COUNT WITH DIFFERENTIAL
Eos, Fluid: 0 %
Lymphs, Fluid: 80 %
Monocyte-Macrophage-Serous Fluid: 19 % — ABNORMAL LOW (ref 50–90)
Neutrophil Count, Fluid: 1 % (ref 0–25)
Total Nucleated Cell Count, Fluid: 412 cu mm (ref 0–1000)

## 2019-08-21 LAB — GRAM STAIN: Gram Stain: NONE SEEN

## 2019-08-21 NOTE — Progress Notes (Signed)
Paracentesis complete no signs of distress.  

## 2019-08-21 NOTE — Procedures (Signed)
PreOperative Dx: Cirrhosis, ascites Postoperative Dx: Cirrhosis, ascites Procedure:   US guided paracentesis Radiologist:  Thornton Papas Anesthesia:  10 ml of1% lidocaine Specimen:  4.8 L of cloudy yellow ascitic fluid EBL:   < 1 ml Complications: None

## 2019-08-22 LAB — CYTOLOGY - NON PAP

## 2019-08-23 LAB — COPPER, URINE - RANDOM OR 24 HOUR
Copper / Creatinine Ratio: 22 ug/g creat (ref 0–49)
Copper, 24H Ur: 20 ug/24 hr (ref 3–35)
Copper, Ur: 12 ug/L
Creatinine(Crt),U: 0.54 g/L (ref 0.30–3.00)

## 2019-08-26 LAB — CULTURE, BODY FLUID W GRAM STAIN -BOTTLE: Culture: NO GROWTH

## 2019-08-29 ENCOUNTER — Inpatient Hospital Stay (HOSPITAL_COMMUNITY): Payer: Medicaid - Out of State | Attending: Hematology | Admitting: Hematology

## 2019-08-29 ENCOUNTER — Other Ambulatory Visit: Payer: Self-pay

## 2019-08-29 ENCOUNTER — Encounter (HOSPITAL_COMMUNITY): Payer: Self-pay | Admitting: Hematology

## 2019-08-29 DIAGNOSIS — K6389 Other specified diseases of intestine: Secondary | ICD-10-CM

## 2019-08-29 DIAGNOSIS — D484 Neoplasm of uncertain behavior of peritoneum: Secondary | ICD-10-CM | POA: Insufficient documentation

## 2019-08-29 DIAGNOSIS — R932 Abnormal findings on diagnostic imaging of liver and biliary tract: Secondary | ICD-10-CM | POA: Diagnosis not present

## 2019-08-29 DIAGNOSIS — Z575 Occupational exposure to toxic agents in other industries: Secondary | ICD-10-CM | POA: Diagnosis not present

## 2019-08-29 DIAGNOSIS — R188 Other ascites: Secondary | ICD-10-CM | POA: Diagnosis not present

## 2019-08-29 DIAGNOSIS — R161 Splenomegaly, not elsewhere classified: Secondary | ICD-10-CM | POA: Diagnosis not present

## 2019-08-29 DIAGNOSIS — R1909 Other intra-abdominal and pelvic swelling, mass and lump: Secondary | ICD-10-CM | POA: Diagnosis present

## 2019-08-29 DIAGNOSIS — B1920 Unspecified viral hepatitis C without hepatic coma: Secondary | ICD-10-CM | POA: Diagnosis not present

## 2019-08-29 DIAGNOSIS — Z79899 Other long term (current) drug therapy: Secondary | ICD-10-CM | POA: Insufficient documentation

## 2019-08-29 DIAGNOSIS — K7469 Other cirrhosis of liver: Secondary | ICD-10-CM | POA: Diagnosis not present

## 2019-08-29 NOTE — Progress Notes (Signed)
[address] [place], [place] [address]   CLINIC:  Medical Oncology/Hematology  PCP:  Nicolas Hauser, MD [address] / [place] [address] [phone]   REASON FOR VISIT:  Follow-up for mesenteric mass  BRIEF ONCOLOGIC HISTORY:  Oncology History   No history exists.    CANCER STAGING: Cancer Staging No matching staging information was found for the patient.  INTERVAL HISTORY:  [name], a 54 y.o. 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 Nicolas [name] was last seen on 08/02/2019.   Nicolas [name] attempted to have a PET scan completed, but his insurance company rerouted his PET from [facility] to a hospital in [place], [place]. Nicolas [name] showed up and they told him that Nicolas [name] was too big for their machine.  Nicolas [name] is able to walk to his visit today.  Nicolas [name] feels better as Nicolas [name] is 60 pounds lighter since last visit.  Nicolas [name] is continuing diuretics.  Nicolas [name] had paracentesis done last week.  REVIEW OF SYSTEMS:  Review of Systems  Constitutional: Positive for unexpected weight change (down 61 lbs since last visit). Negative for appetite change, chills, fatigue and fever.  HENT:   Negative for lump/mass, mouth sores, sore throat and trouble swallowing.   Eyes: Negative for eye problems.  Respiratory: Negative for chest tightness, cough, shortness of breath and wheezing.   Cardiovascular: Negative for chest pain and palpitations.  Gastrointestinal: Negative for abdominal pain, constipation, diarrhea, nausea and vomiting.  Genitourinary: Negative for bladder incontinence, dysuria, frequency and hematuria.   Musculoskeletal: Negative for arthralgias, back pain, flank pain and myalgias.  Skin: Negative for rash.  Neurological: Negative for dizziness, headaches, light-headedness and numbness.  Hematological: Does not bruise/bleed easily.  Psychiatric/Behavioral: Negative for depression. The patient is not nervous/anxious.     PAST MEDICAL/SURGICAL HISTORY:   Past Medical History:  Diagnosis Date  . HTN (hypertension)    Past Surgical History:  Procedure Laterality Date  . debridement  Right    per pt, debridement of rt hand    SOCIAL HISTORY:  Social History   Socioeconomic History  . Marital status: Divorced    Spouse name: Not on file  . Number of children: 4  . Years of education: Not on file  . Highest education level: Not on file  Occupational History  . Occupation: unemployed  Tobacco Use  . Smoking status: Never Smoker  . Smokeless tobacco: Current User    Types: Snuff  . Tobacco comment: started using snuff since 1986  Substance and Sexual Activity  . Alcohol use: Not Currently    Comment: occasional  . Drug use: Never  . Sexual activity: Not Currently  Other Topics Concern  . Not on file  Social History Narrative  . Not on file   Social Determinants of Health   Financial Resource Strain: Low Risk   . Difficulty of Paying Living Expenses: Not very hard  Food Insecurity: No Food Insecurity  . Worried About Charity fundraiser in the Last Year: Never true  . Ran Out of Food in the Last Year: Never true  Transportation Needs: Unmet Transportation Needs  . Lack of Transportation (Medical): Yes  . Lack of Transportation (Non-Medical): Yes  Physical Activity: Inactive  . Days of Exercise per Week: 0 days  . Minutes of Exercise per Session: 0 min  Stress: Stress Concern Present  . Feeling of Stress : To some extent  Social Connections: Moderately Isolated  . Frequency of Communication with  Friends and Family: Three times a week  . Frequency of Social Gatherings with Friends and Family: Three times a week  . Attends Religious Services: Never  . Active Member of Clubs or Organizations: No  . Attends Archivist Meetings: Never  . Marital Status: Divorced  Human resources officer Violence: Not At Risk  . Fear of Current or Ex-Partner: No  . Emotionally Abused: No  . Physically Abused: No  . Sexually Abused:  No    FAMILY HISTORY:  Family History  Problem Relation Age of Onset  . Cirrhosis Sister        per patient, due to drug use.   . Alzheimer's disease Mother   . Cancer Mother   . Diabetes Mother   . Congestive Heart Failure Father   . Diabetes Father   . Alzheimer's disease Paternal Grandmother   . Osteoporosis Paternal Grandmother   . Diabetes Daughter   . Colon cancer Neg Hx     CURRENT MEDICATIONS:  Current Outpatient Medications  Medication Sig Dispense Refill  . furosemide (LASIX) 40 MG tablet Take 1 tablet (40 mg total) by mouth 2 times daily. Take 1 tablet first thing in the morning with spironolactone 100 mg and 1 tablet at 4PM with spironolactone 100 mg. 60 tablet 2  . Lactulose 20 GM/30ML SOLN Take 30 mLs (20 g total) by mouth 2 (two) times daily. 1892 mL 2  . spironolactone (ALDACTONE) 100 MG tablet Take 1 tablet (100 mg total) by mouth 2 times daily. Take 1 tablet first thing in the morning with Lasix 40 mg and 1 tablet at 4PM with Lasix 40 mg. 60 tablet 2   No current facility-administered medications for this visit.    ALLERGIES:  No Known Allergies  PHYSICAL EXAM:  Performance status (ECOG): 1 - Symptomatic but completely ambulatory  Vitals:   08/29/19 1119  BP: (!) 139/93  Pulse: 98  Resp: 18  Temp: 97.8 F (36.6 C)  SpO2: 97%   Wt Readings from Last 3 Encounters:  08/29/19 (!) 400 lb 3.2 oz (181.5 kg)  08/14/19 (!) 441 lb 9.6 oz (200.3 kg)  08/02/19 (!) 461 lb 6.4 oz (209.3 kg)   Physical Exam  Constitutional: Nicolas [name] appears well-developed and well-nourished.  HENT:  Head: Normocephalic.  Cardiovascular: Regular rhythm and normal heart sounds.  Pulmonary/Chest: Effort normal and breath sounds normal.  Abdominal: Soft. Nicolas [name] exhibits distension.  Musculoskeletal:        General: Edema present.      LABORATORY DATA:  I have reviewed the labs as listed.  CBC Latest Ref Rng & Units 08/02/2019  WBC 4.0 - 10.5 K/uL 5.6  Hemoglobin 13.0 - 17.0 g/dL  14.8  Hematocrit 39.0 - 52.0 % 45.6  Platelets 150 - 400 K/uL 165   CMP Latest Ref Rng & Units 08/15/2019  Glucose 65 - 99 mg/dL 93  BUN 6 - 24 mg/dL 9  Creatinine 0.76 - 1.27 mg/dL 0.84  Sodium 134 - 144 mmol/L 138  Potassium 3.5 - 5.2 mmol/L 4.6  Chloride 96 - 106 mmol/L 99  CO2 20 - 29 mmol/L 27  Calcium 8.7 - 10.2 mg/dL 9.0  Total Protein 6.0 - 8.5 g/dL 8.0  Total Bilirubin 0.0 - 1.2 mg/dL 1.1  Alkaline Phos 39 - 117 IU/L 98  AST 0 - 40 IU/L 36  ALT 0 - 44 IU/L 26    DIAGNOSTIC IMAGING:  I have reviewed scans.  ASSESSMENT & PLAN:  Mesenteric mass 1.  Mesenteric mass: -  Patient presented to the [facility] on 07[facility]2019 with lower extremity swelling and abdominal distention. -CTAP on 07/25/2019 showed splenomegaly 16 cm, nodular liver consistent with cirrhosis.  Large volume ascites.  Hazy edematous appearance of the mesentery.  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  Possible mild nodular infiltration of the left upper quadrant mesentery and anterior omentum.  Multiple small mesenteric root nodes. 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  Exposure to MEK present.  Possible exposure to asbestos. -We reviewed results of LDH and CEA which were within normal limits. -Nicolas [name] was reportedly scheduled for PET scan in [place] but could not be done because it was a portable machine. -We will reschedule the PET scan in [facility].  2.  Cirrhosis and ascites: -This is thought to be secondary to hepatitis C. -Last paracentesis on 08/21/2019 with 4.8 L removed. -Continue Lasix and spironolactone. -Weight improved by 60 pounds since last visit.    Orders placed this encounter:  No orders of the defined types were placed in this encounter.    Derek Jack, MD, 08/29/19 12:04 PM  [place] [phone]   I, Jacqualyn Posey, am acting as a scribe for Dr. Sanda Linger.  I, Derek Jack MD, have reviewed the above documentation for  accuracy and completeness, and I agree with the above.

## 2019-08-29 NOTE — Patient Instructions (Signed)
[place] at [facility] Discharge Instructions  You were seen today by Dr. Delton Coombes. He went over your recent results. He will see you back in for labs and follow up. We will schedule you for your PET scan the the [facility] in [place], [place].    Thank you for choosing [place] at [facility] to provide your oncology and hematology care.  To afford each patient quality time with our provider, please arrive at least 15 minutes before your scheduled appointment time.   If you have a lab appointment with the [facility] please come in thru the  Main Entrance and check in at the main information desk  You need to re-schedule your appointment should you arrive 10 or more minutes late.  We strive to give you quality time with our providers, and arriving late affects you and other patients whose appointments are after yours.  Also, if you no show three or more times for appointments you may be dismissed from the clinic at the providers discretion.     Again, thank you for choosing [facility].  Our hope is that these requests will decrease the amount of time that you wait before being seen by our physicians.       _____________________________________________________________  Should you have questions after your visit to [facility], please contact our office at [phone] between the hours of 8:00 a.m. and 4:30 p.m.  Voicemails left after 4:00 p.m. will not be returned until the following business day.  For prescription refill requests, have your pharmacy contact our office and allow 72 hours.    Cancer Center Support Programs:   > Cancer Support Group  2nd Tuesday of the month 1pm-2pm, Journey Room

## 2019-08-29 NOTE — Assessment & Plan Note (Signed)
1.  Mesenteric mass: -Patient presented to the [facility] on 07/24/2019 with lower extremity swelling and abdominal distention. -CTAP on 07/25/2019 showed splenomegaly 16 cm, nodular liver consistent with cirrhosis.  Large volume ascites.  Hazy edematous appearance of the mesentery.  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  Possible mild nodular infiltration of the left upper quadrant mesentery and anterior omentum.  Multiple small mesenteric root nodes. 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  Exposure to MEK present.  Possible exposure to asbestos. -We reviewed results of LDH and CEA which were within normal limits. -He was reportedly scheduled for PET scan in [place] but could not be done because it was a portable machine. -We will reschedule the PET scan in [facility].  2.  Cirrhosis and ascites: -This is thought to be secondary to hepatitis C. -Last paracentesis on 08/21/2019 with 4.8 L removed. -Continue Lasix and spironolactone. -Weight improved by 60 pounds since last visit.

## 2019-08-30 NOTE — Progress Notes (Signed)
Referring Provider: Olin Hauser, MD Primary Care Physician:  Olin Hauser, MD Primary GI Physician: Dr. Gala Romney  Chief Complaint  Patient presents with  . Cirrhosis    f/u. scheduled for tcs/egd in June. C/o SOB w/ exertion. lactulose was &144 and did not get. BM's normal  . Hepatitis C    HPI:   Nicolas [name] is a 53 y.o. male presenting today for follow-up of newly diagnosed cirrhosis in April 2021 with chronic Hep C. Also with splenomegaly and large-volume ascites. Concerns for malignancy with elevated AFP and possible mild nodular infiltration of the left upper quadrant mesentery and anterior omentum, and 5.3 mesenteric mass noted on outside CT in April 2021. Following with Dr. Delton Coombes. Was supposed to have PET scan on 08/18/19 but this has not been completed due to being too big for imaging to be completed in [place]. Doesn't look like this has been rescheduled.    Prior workup for cirrhosis has included Hepatitis C antibody positive with RNA 436,000. Lab for genotype not yet completed. Hep A/B negative.  No immunity to hep A/B.  AFP elevated at 14.2. ANA, ASMA, AMA within normal limits.  IgG and IgA elevated.  Ceruloplasmin elevated at 32.1 but follow-up 24 hour urine copper within normal limits.  Iron panel with slightly elevated percent saturation at 41%. Hemochromatosis labs not yet completed. He has also undergone 2  Paras on 4/16 yielding 4.2L and 5/10 yielding 4.8, no SBP.   Last seen in our office on 08/14/2019.  He was down 30 pounds in 2 weeks.  Was taking Lasix 40 mg and spironolactone 100 mg daily.  Still felt his abdomen was pretty distended but noted improvement.  Swelling in lower extremities was improving.  Swelling in scrotum had resolved.  Continued with mild abdominal discomfort in the right upper quadrant and lower abdomen without any significant change.  No confusion or jaundice.  Early satiety was improving.  Admitted to chronic hand tremor x4-5 years.   No prior colonoscopy or EGD.  On exam, abdomen was large and distended but less tense compared to 2 weeks prior.  Still with 1+ pitting edema in the lower abdomen and 1+ pitting edema in his thighs and 3+ bilateral lower extremity pitting edema up to his knees.  No asterixis.  Suspected cirrhosis secondary to hep C and possible NASH.  Considered MRI to evaluate elevated AFP further but patient was already planned for PET scan so we will await these results.  Patient would not go to the hospital for IV diuretics. Plans to update labs, check ammonia, schedule repeat para, and likely increase diuretics pending lab results. Arrange EGD for varices screening. Hep A/B vaccination with PCP. Colonoscopy for colon cancer screening. Follow-up in 2 weeks.   Ammonia within normal limits. LFTs, electrolytes, and kidney function within normal limits.  Albumin low at 2.8.  Meld 9 based on prior INR completed 4/16.  He was advised to increase his diuretics to Lasix 40 mg twice daily and spironolactone 100 mg twice daily and recheck BMP in 1 week. Paracentesis 08/21/2019 yielding 4.8 L with no SBP.  No malignant cells. Hemochromatosis DNA and hep C genotype was not completed. Repeat BMP was not completed.  Today:  Down 42 lbs since last visit.  Down a total of 72 pounds since initial visit on 07/28/2019. Lasix 40 and Aldactone 100 mg BID. Abdominal swelling much improved. Has about 5 inches between his abdomen and the steering whee now. Initially, abdomen was  rubbing the steering wheel. LE swelling much improved. States, "I have ankles now." No yellowing of eyes or skin. No confusion.   Continues to have hand tremor. Has appt tomorrow to establish with PCP and plans to discuss this with them.   Sore in the RLQ from prior paras. Feels like a pulled muscle.  Continues with lower back pain. Makes his right leg numb at times. Also with mild right upper rib pain. No RUQ pain.   Hasn't been able to get ahold of Dr. Delton Coombes.  Plans to call them again today to discuss scheduling PET scan.   Appetite is doing better. Trying to eat healthier as well. Eating 2 meals and a snack a day. Drinking ensure. Has cut soda out completely. Only drinking water and juice. Mild early satiety. Not necessarily watching sodium intake. States he doesn't like salt.  No soups. Not eating anything out of a can. Not seasoning anything.   No nausea or vomiting. No blood in the stool or black stool. BMs daily. No constipation or diarrhea. No dysphagia.   Past Medical History:  Diagnosis Date  . Cirrhosis ([place])   . Hepatitis C    Ab + 07/28/19. RNA 436,000  . HTN (hypertension)     Past Surgical History:  Procedure Laterality Date  . debridement  Right    per pt, debridement of rt hand    Current Outpatient Medications  Medication Sig Dispense Refill  . furosemide (LASIX) 40 MG tablet Take 1 tablet (40 mg total) by mouth 2 times daily. Take 1 tablet first thing in the morning with spironolactone 100 mg and 1 tablet at 4PM with spironolactone 100 mg. 60 tablet 2  . spironolactone (ALDACTONE) 100 MG tablet Take 1 tablet (100 mg total) by mouth 2 times daily. Take 1 tablet first thing in the morning with Lasix 40 mg and 1 tablet at 4PM with Lasix 40 mg. 60 tablet 2   No current facility-administered medications for this visit.    Allergies as of 08/31/2019  . (No Known Allergies)    Family History  Problem Relation Age of Onset  . Cirrhosis Sister        per patient, due to drug use.   . Alzheimer's disease Mother   . Cancer Mother   . Diabetes Mother   . Congestive Heart Failure Father   . Diabetes Father   . Alzheimer's disease Paternal Grandmother   . Osteoporosis Paternal Grandmother   . Diabetes Daughter   . Colon cancer Neg Hx     Social History   Socioeconomic History  . Marital status: Divorced    Spouse name: Not on file  . Number of children: 4  . Years of education: Not on file  . Highest education level:  Not on file  Occupational History  . Occupation: unemployed  Tobacco Use  . Smoking status: Never Smoker  . Smokeless tobacco: Current User    Types: Snuff  . Tobacco comment: started using snuff since 1986  Substance and Sexual Activity  . Alcohol use: Not Currently    Comment: occasional  . Drug use: Never  . Sexual activity: Not Currently  Other Topics Concern  . Not on file  Social History Narrative  . Not on file   Social Determinants of Health   Financial Resource Strain: Low Risk   . Difficulty of Paying Living Expenses: Not very hard  Food Insecurity: No Food Insecurity  . Worried About Charity fundraiser in the  Last Year: Never true  . Ran Out of Food in the Last Year: Never true  Transportation Needs: Unmet Transportation Needs  . Lack of Transportation (Medical): Yes  . Lack of Transportation (Non-Medical): Yes  Physical Activity: Inactive  . Days of Exercise per Week: 0 days  . Minutes of Exercise per Session: 0 min  Stress: Stress Concern Present  . Feeling of Stress : To some extent  Social Connections: Moderately Isolated  . Frequency of Communication with Friends and Family: Three times a week  . Frequency of Social Gatherings with Friends and Family: Three times a week  . Attends Religious Services: Never  . Active Member of Clubs or Organizations: No  . Attends Archivist Meetings: Never  . Marital Status: Divorced    Review of Systems: Gen: Denies fever, chills, lightheadedness, dizziness, presyncope, syncope. CV: Denies chest pain or palpitations Resp: Mild SOB with exertion. No SOB at rest. Occasional cough.  GI: See HPI Derm: Denies rash Psych: Denies depression or anxiety.  Heme: See HPI  Physical Exam: BP 136/85   Pulse 83   Temp (!) 97.3 F (36.3 C) (Oral)   Ht 6\' 5"  (1.956 m)   Wt (!) 399 lb 6.4 oz (181.2 kg)   BMI 47.36 kg/m  General:   Alert and oriented. No distress noted. Pleasant and cooperative.  Head:   Normocephalic and atraumatic. Eyes:  Conjuctiva clear without scleral icterus. Heart:  S1, S2 present without murmurs appreciated. Lungs:  Clear to auscultation bilaterally. No wheezes, rales, or rhonchi. No distress.  Abdomen: Obese abdomen. +BS, soft, non-tender.  Prior distention much improved. Lower abdominal anasarca resolved.  Mild tenderness to palpation in the right lower quadrant.  Minimal tenderness palpation in the right upper quadrant.  Mild tenderness palpation along the right upper rib cage.  No abdominal rebound or guarding. No appreciable masses. Msk:  Symmetrical without gross deformities. Normal posture. Extremities:  With minimal 1+ bilateral lower extremity edema in ankles up to mid shin.  Neurologic:  Alert and  oriented x4 Psych: Normal mood and affect.

## 2019-08-31 ENCOUNTER — Other Ambulatory Visit: Payer: Self-pay | Admitting: *Deleted

## 2019-08-31 ENCOUNTER — Other Ambulatory Visit (HOSPITAL_COMMUNITY): Payer: Self-pay | Admitting: *Deleted

## 2019-08-31 ENCOUNTER — Ambulatory Visit (INDEPENDENT_AMBULATORY_CARE_PROVIDER_SITE_OTHER): Payer: Medicaid - Out of State | Admitting: Gastroenterology

## 2019-08-31 ENCOUNTER — Encounter: Payer: Self-pay | Admitting: Gastroenterology

## 2019-08-31 ENCOUNTER — Other Ambulatory Visit: Payer: Self-pay

## 2019-08-31 VITALS — BP 136/85 | HR 83 | Temp 97.3°F | Ht 77.0 in | Wt 399.4 lb

## 2019-08-31 DIAGNOSIS — K6389 Other specified diseases of intestine: Secondary | ICD-10-CM

## 2019-08-31 DIAGNOSIS — Z1211 Encounter for screening for malignant neoplasm of colon: Secondary | ICD-10-CM

## 2019-08-31 DIAGNOSIS — R188 Other ascites: Secondary | ICD-10-CM | POA: Diagnosis not present

## 2019-08-31 DIAGNOSIS — B1920 Unspecified viral hepatitis C without hepatic coma: Secondary | ICD-10-CM | POA: Insufficient documentation

## 2019-08-31 DIAGNOSIS — R768 Other specified abnormal immunological findings in serum: Secondary | ICD-10-CM

## 2019-08-31 DIAGNOSIS — B182 Chronic viral hepatitis C: Secondary | ICD-10-CM | POA: Diagnosis not present

## 2019-08-31 DIAGNOSIS — K7460 Unspecified cirrhosis of liver: Secondary | ICD-10-CM

## 2019-08-31 NOTE — Assessment & Plan Note (Signed)
54 year old male newly diagnosed with cirrhosis in April 2021 with associated splenomegaly and large volume ascites also found to have hep C antibody positive on 07/28/2019.  Hep C RNA 436,000.  Genotype not yet completed.  Risk factors include home tattoos received at age 73.  Denies history of drug use or incarceration.  No known exposure to hepatitis C.  Plan to check hep C genotype and refer to [facility] Locks at the liver clinic in [place] for consideration of treatment as he has had decompensated cirrhosis. He is concerned about his partner contracting hepatitis C from him.  Explained that risk of sexual transmission is low but if he is concerned, she could get tested and he could use protection until he receives treatment. Counseled on ensuring others not come in contact with his blood and to avoid sharing personal equipment including toothbrushes and razors with anyone.

## 2019-08-31 NOTE — Assessment & Plan Note (Signed)
54 year old male with history of HTN, obesity, and newly diagnosed cirrhosis in April 2021, likely secondary to chronic hep C/possible [place] , with splenomegaly and large volume ascites/anasarca that is now significantly improved/resolved with diuretic therapy.  Notably, on CT in April there was also noted a mesenteric mass for which patient has established with Dr. Delton Coombes.  PET scan was scheduled for 5/7 but this was canceled due to patient's body habitus being too large to have completed in [place].  He is calling Dr. Delton Coombes today to discuss rescheduling.  No prior colonoscopy.  No significant lower GI symptoms.  No BRBPR or melena.  No family history of colon cancer.  He is currently scheduled for EGD for varices screening and TCS for colon cancer screening on 09/14/2019.  Proceed with TCS with propofol with Dr. Gala Romney as already scheduled. We will see him back in 4 weeks for close follow-up on cirrhosis.

## 2019-08-31 NOTE — Assessment & Plan Note (Addendum)
54 year old male with history of HTN and obesity who was newly diagnosed with cirrhosis in April 2021 via CT with associated splenomegaly and large volume ascites.  Meld 9.  Also noted possible mild nodular infiltration of the left upper quadrant mesentery and anterior omentum and 5.3 cm mesenteric mass slightly to right of midline.  Significant serologic work-up to evaluate for etiology of cirrhosis was remarkable for positive hepatitis C with RNA 436,000.  Also with slightly elevated iron percent saturation at 41%.  Hemochromatosis DNA has not been checked.  Notably, AFP was also elevated at 14.2. Patient had significant fluid overload at his initial office visit on 07/28/2019.  He was started on Lasix 40 mg and spironolactone 100 mg.  At follow-up visit on 08/14/2019, he was diuresing well, kidney function and electrolytes were within normal limits, and he continued with significant fluid overload so diuretics were increased to Lasix 40 mg twice daily and spironolactone 100 mg twice daily.  Today he is down a total of 72 pounds since his initial visit.  Peripheral edema has essentially resolved with minimal lower extremity edema on exam today.  Unfortunately, he did not have his BMP repeated last week as planned.  Otherwise, he denies jaundice or mental status changes.  He does have a chronic hand tremor with ammonia within normal limits at last visit.  He does admit to very mild right lower quadrant pain and upper right rib pain which is concerning considering possible malignancy on CT.  He is following with Dr. Delton Coombes for this.  PET scan on 5/7 was canceled due to patient's body habitus being too large to have this completed in [place].  He plans to call Dr. Delton Coombes today to discuss rescheduling PET scan.  Suspect cirrhosis is likely secondary to chronic hep C and possibly also influenced by underlying Karlene Lineman.  Cannot rule out malignancy.  Update BMP, and complete hepatitis C genotype and hemochromatosis  DNA. As peripheral edema/anasarca has essentially resolved, will decrease diuretics back to Lasix 40 mg daily and spironolactone 100 mg daily.  He was advised to monitor for any return of peripheral edema and let me know immediately. Counseled on a low-sodium diet.  No more than 2 g daily.  Handout provided. Advised he discuss receiving hep A and B vaccination at his visit with his PCP tomorrow. He is already scheduled for EGD and colonoscopy with propofol with Dr Gala Romney on 6/3 for varices screening and colon cancer screening. Referring to [facility] Locks at liver clinic in [place] for consideration of Hep C treatment. We will see him back in 4 weeks for close follow-up.

## 2019-08-31 NOTE — Patient Instructions (Addendum)
Please have your labs completed as soon as possible at [facility].    As you have had significant improvement in your swelling, lets decrease Lasix to 40 mg daily and Aldaactone to 100 gm daily.   Please monitor your swelling closely. If you notice any fluid re-accumulation in your lower extremities or abdomen, please call me immediately.   It is also very important you follow a low sodium diet to prevent fluid accumulation. No more than 2g daily. This includes all fluid and food intake. See handout below.   We will refer you to [facility] in [place] to discuss Hep C treatment.   Please call Dr. Delton Coombes today to reschedule PET scan.   Talk with PCP about Hep A and B vaccination. Also discuss your back pain and hand tremor.   We will see you in 4 weeks for follow-up. Call with questions or concerns prior.   Aliene Altes, PA-C [facility]   Low-Sodium Eating Plan Sodium, which is an element that makes up salt, helps you maintain a healthy balance of fluids in your body. Too much sodium can increase your blood pressure and cause fluid and waste to be held in your body. Your health care provider or dietitian may recommend following this plan if you have high blood pressure (hypertension), kidney disease, liver disease, or heart failure. Eating less sodium can help lower your blood pressure, reduce swelling, and protect your heart, liver, and kidneys. What are tips for following this plan? General guidelines  Most people on this plan should limit their sodium intake to 1,500-2,000 mg (milligrams) of sodium each day. Reading food labels   The Nutrition Facts label lists the amount of sodium in one serving of the food. If you eat more than one serving, you must multiply the listed amount of sodium by the number of servings.  Choose foods with less than 140 mg of sodium per serving.  Avoid foods with 300 mg of sodium or more per serving. Shopping  Look for  lower-sodium products, often labeled as "low-sodium" or "no salt added."  Always check the sodium content even if foods are labeled as "unsalted" or "no salt added".  Buy fresh foods. ? Avoid canned foods and premade or frozen meals. ? Avoid canned, cured, or processed meats  Buy breads that have less than 80 mg of sodium per slice. Cooking  Eat more home-cooked food and less restaurant, buffet, and fast food.  Avoid adding salt when cooking. Use salt-free seasonings or herbs instead of table salt or sea salt. Check with your health care provider or pharmacist before using salt substitutes.  Cook with plant-based oils, such as canola, sunflower, or olive oil. Meal planning  When eating at a restaurant, ask that your food be prepared with less salt or no salt, if possible.  Avoid foods that contain MSG (monosodium glutamate). MSG is sometimes added to [place] food, bouillon, and some canned foods. What foods are recommended? The items listed may not be a complete list. Talk with your dietitian about what dietary choices are best for you. Grains Low-sodium cereals, including oats, puffed wheat and rice, and shredded wheat. Low-sodium crackers. Unsalted rice. Unsalted pasta. Low-sodium bread. Whole-grain breads and whole-grain pasta. Vegetables Fresh or frozen vegetables. "No salt added" canned vegetables. "No salt added" tomato sauce and paste. Low-sodium or reduced-sodium tomato and vegetable juice. Fruits Fresh, frozen, or canned fruit. Fruit juice. Meats and other protein foods Fresh or frozen (no salt added) meat, poultry, seafood, and  fish. Low-sodium canned tuna and salmon. Unsalted nuts. Dried peas, beans, and lentils without added salt. Unsalted canned beans. Eggs. Unsalted nut butters. Dairy Milk. Soy milk. Cheese that is naturally low in sodium, such as ricotta cheese, fresh mozzarella, or Swiss cheese Low-sodium or reduced-sodium cheese. Cream cheese. Yogurt. Fats and  oils Unsalted butter. Unsalted margarine with no trans fat. Vegetable oils such as canola or olive oils. Seasonings and other foods Fresh and dried herbs and spices. Salt-free seasonings. Low-sodium mustard and ketchup. Sodium-free salad dressing. Sodium-free light mayonnaise. Fresh or refrigerated horseradish. Lemon juice. Vinegar. Homemade, reduced-sodium, or low-sodium soups. Unsalted popcorn and pretzels. Low-salt or salt-free chips. What foods are not recommended? The items listed may not be a complete list. Talk with your dietitian about what dietary choices are best for you. Grains Instant hot cereals. Bread stuffing, pancake, and biscuit mixes. Croutons. Seasoned rice or pasta mixes. Noodle soup cups. Boxed or frozen macaroni and cheese. Regular salted crackers. Self-rising flour. Vegetables Sauerkraut, pickled vegetables, and relishes. Olives. [place] fries. Onion rings. Regular canned vegetables (not low-sodium or reduced-sodium). Regular canned tomato sauce and paste (not low-sodium or reduced-sodium). Regular tomato and vegetable juice (not low-sodium or reduced-sodium). Frozen vegetables in sauces. Meats and other protein foods Meat or fish that is salted, canned, smoked, spiced, or pickled. Bacon, ham, sausage, hotdogs, corned beef, chipped beef, packaged lunch meats, salt pork, jerky, pickled herring, anchovies, regular canned tuna, sardines, salted nuts. Dairy Processed cheese and cheese spreads. Cheese curds. Blue cheese. Feta cheese. String cheese. Regular cottage cheese. Buttermilk. Canned milk. Fats and oils Salted butter. Regular margarine. Ghee. Bacon fat. Seasonings and other foods Onion salt, garlic salt, seasoned salt, table salt, and sea salt. Canned and packaged gravies. Worcestershire sauce. Tartar sauce. Barbecue sauce. Teriyaki sauce. Soy sauce, including reduced-sodium. Steak sauce. Fish sauce. Oyster sauce. Cocktail sauce. Horseradish that you find on the shelf.  Regular ketchup and mustard. Meat flavorings and tenderizers. Bouillon cubes. Hot sauce and Tabasco sauce. Premade or packaged marinades. Premade or packaged taco seasonings. Relishes. Regular salad dressings. Salsa. Potato and tortilla chips. Corn chips and puffs. Salted popcorn and pretzels. Canned or dried soups. Pizza. Frozen entrees and pot pies. Summary  Eating less sodium can help lower your blood pressure, reduce swelling, and protect your heart, liver, and kidneys.  Most people on this plan should limit their sodium intake to 1,500-2,000 mg (milligrams) of sodium each day.  Canned, boxed, and frozen foods are high in sodium. Restaurant foods, fast foods, and pizza are also very high in sodium. You also get sodium by adding salt to food.  Try to cook at home, eat more fresh fruits and vegetables, and eat less fast food, canned, processed, or prepared foods. This information is not intended to replace advice given to you by your health care provider. Make sure you discuss any questions you have with your health care provider. Document Revised: 03/12/2017 Document Reviewed: 03/23/2016 Elsevier Patient Education  2020 [employer].

## 2019-09-04 ENCOUNTER — Telehealth: Payer: Self-pay | Admitting: Internal Medicine

## 2019-09-04 NOTE — Telephone Encounter (Signed)
Patient was a referral from [facility],  Patient called and said her insurance was [employer],  That is one we do not participate with and she is on the schedule for a procedure

## 2019-09-04 NOTE — Telephone Encounter (Signed)
Routing to [facility] clinical pool to see where we are with the PA for her procedure

## 2019-09-04 NOTE — Telephone Encounter (Signed)
Reviewed

## 2019-09-04 NOTE — Telephone Encounter (Signed)
Noted. We have been following him for cirrhosis and he does need an EGD for varices screening in the near future. It is unfortunate that we are out of network. Called patient. States he is going to need to find a gastroenterologist within his network that he can afford. Advised he reach out to his insurance company to see what gastroenterologists is in network. State he will try to do this by Wednesday and will call back to let us know. Advised that we would be able to send records/referral to whomever is within network. We will leave his follow-up appointment scheduled in 4 weeks for now.    Izora Ribas

## 2019-09-04 NOTE — Telephone Encounter (Signed)
Called patient. He states he wants to cancel procedures. He can't afford the out of pocket cost for these and prefers to go where it is in network. Called endo and procedures cancelled. FYI TO [facility]

## 2019-09-11 LAB — ACID FAST CULTURE WITH REFLEXED SENSITIVITIES (MYCOBACTERIA): Acid Fast Culture: NEGATIVE

## 2019-09-12 ENCOUNTER — Other Ambulatory Visit (HOSPITAL_COMMUNITY): Payer: Medicaid - Out of State

## 2019-09-12 ENCOUNTER — Encounter (HOSPITAL_COMMUNITY): Admission: RE | Admit: 2019-09-12 | Payer: Medicaid - Out of State | Source: Ambulatory Visit

## 2019-09-13 ENCOUNTER — Other Ambulatory Visit: Payer: Self-pay

## 2019-09-13 ENCOUNTER — Encounter (HOSPITAL_COMMUNITY): Payer: Self-pay | Admitting: Hematology

## 2019-09-13 ENCOUNTER — Inpatient Hospital Stay (HOSPITAL_COMMUNITY): Payer: Medicaid - Out of State | Attending: Hematology | Admitting: Hematology

## 2019-09-13 VITALS — HR 80 | Temp 98.2°F | Resp 20 | Wt 387.5 lb

## 2019-09-13 DIAGNOSIS — K7460 Unspecified cirrhosis of liver: Secondary | ICD-10-CM | POA: Diagnosis not present

## 2019-09-13 DIAGNOSIS — R1909 Other intra-abdominal and pelvic swelling, mass and lump: Secondary | ICD-10-CM | POA: Diagnosis not present

## 2019-09-13 DIAGNOSIS — K6389 Other specified diseases of intestine: Secondary | ICD-10-CM

## 2019-09-13 DIAGNOSIS — B1920 Unspecified viral hepatitis C without hepatic coma: Secondary | ICD-10-CM | POA: Diagnosis not present

## 2019-09-13 NOTE — Patient Instructions (Signed)
[place] at [facility] Discharge Instructions  You were seen today by Dr. Delton Coombes. He went over your recent results. Dr. Delton Coombes has counseled you about getting a biopsy of your mass. Dr. Delton Coombes will see you back after the biopsy for follow up.   Thank you for choosing [place] at [facility] to provide your oncology and hematology care.  To afford each patient quality time with our provider, please arrive at least 15 minutes before your scheduled appointment time.   If you have a lab appointment with the [facility] please come in thru the  Main Entrance and check in at the main information desk  You need to re-schedule your appointment should you arrive 10 or more minutes late.  We strive to give you quality time with our providers, and arriving late affects you and other patients whose appointments are after yours.  Also, if you no show three or more times for appointments you may be dismissed from the clinic at the providers discretion.     Again, thank you for choosing [facility].  Our hope is that these requests will decrease the amount of time that you wait before being seen by our physicians.       _____________________________________________________________  Should you have questions after your visit to [facility], please contact our office at [phone] between the hours of 8:00 a.m. and 4:30 p.m.  Voicemails left after 4:00 p.m. will not be returned until the following business day.  For prescription refill requests, have your pharmacy contact our office and allow 72 hours.    Cancer Center Support Programs:   > Cancer Support Group  2nd Tuesday of the month 1pm-2pm, Journey Room

## 2019-09-13 NOTE — Progress Notes (Signed)
[place], [place] [address]   CLINIC:  Medical Oncology/Hematology  PCP:  Olin Hauser, MD [address] 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    CANCER STAGING: Cancer Staging No matching staging information was found for the patient.  INTERVAL HISTORY:  [name], a 54 y.o. 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 Nicolas [name] was last seen on 08/29/2019.  He has been taking diuretics and losing weight.  He lost another 12 pounds since last visit.  He reports some shortness of breath with exertion.  He reports that he has an appointment with a neurologist to investigate the back pain.    REVIEW OF SYSTEMS:  Review of Systems  Constitutional: Positive for appetite change (Mildly decreased) and fatigue (Moderate).  Respiratory: Positive for shortness of breath (W/ exertion).   Musculoskeletal:       Lower back pain 8/10  Neurological:       Tremors  All other systems reviewed and are negative.   PAST MEDICAL/SURGICAL HISTORY:  Past Medical History:  Diagnosis Date  . Cirrhosis ([place])   . Hepatitis C    Ab + 07/28/19. RNA 436,000  . HTN (hypertension)    Past Surgical History:  Procedure Laterality Date  . debridement  Right    per pt, debridement of rt hand    SOCIAL HISTORY:  Social History   Socioeconomic History  . Marital status: Divorced    Spouse name: Not on file  . Number of children: 4  . Years of education: Not on file  . Highest education level: Not on file  Occupational History  . Occupation: unemployed  Tobacco Use  . Smoking status: Never Smoker  . Smokeless tobacco: Current User    Types: Snuff  . Tobacco comment: started using snuff since 1986  Substance and Sexual Activity  . Alcohol use: Not  Currently    Comment: occasional  . Drug use: Never  . Sexual activity: Not Currently  Other Topics Concern  . Not on file  Social History Narrative  . Not on file   Social Determinants of Health   Financial Resource Strain: Low Risk   . Difficulty of Paying Living Expenses: Not very hard  Food Insecurity: No Food Insecurity  . Worried About Charity fundraiser in the Last Year: Never true  . Ran Out of Food in the Last Year: Never true  Transportation Needs: Unmet Transportation Needs  . Lack of Transportation (Medical): Yes  . Lack of Transportation (Non-Medical): Yes  Physical Activity: Inactive  . Days of Exercise per Week: 0 days  . Minutes of Exercise per Session: 0 min  Stress: Stress Concern Present  . Feeling of Stress : To some extent  Social Connections: Moderately Isolated  . Frequency of Communication with Friends and Family: Three times a week  . Frequency of Social Gatherings with Friends and Family: Three times a week  . Attends Religious Services: Never  . Active Member of Clubs or Organizations: No  . Attends Archivist Meetings: Never  . Marital Status: Divorced  Human resources officer Violence: Not At Risk  . Fear of Current or Ex-Partner: No  . Emotionally Abused: No  . Physically Abused: No  . Sexually Abused: No    FAMILY HISTORY:  Family History  Problem Relation Age of Onset  . Cirrhosis Sister        per patient, due to drug use.   . Alzheimer's disease Mother   . Cancer Mother   . Diabetes Mother   . Congestive Heart Failure Father   . Diabetes Father   . Alzheimer's disease Paternal Grandmother   . Osteoporosis Paternal Grandmother   . Diabetes Daughter   . Colon cancer Neg Hx     CURRENT MEDICATIONS:  Current Outpatient Medications  Medication Sig Dispense Refill  . Ensure (ENSURE) Take 237 mLs by mouth every evening.    . furosemide (LASIX) 40 MG tablet Take 1 tablet (40 mg total) by mouth 2 times daily. Take 1 tablet first  thing in the morning with spironolactone 100 mg and 1 tablet at 4PM with spironolactone 100 mg. (Patient taking differently: Take 40 mg by mouth daily. ) 60 tablet 2  . spironolactone (ALDACTONE) 100 MG tablet Take 1 tablet (100 mg total) by mouth 2 times daily. Take 1 tablet first thing in the morning with Lasix 40 mg and 1 tablet at 4PM with Lasix 40 mg. (Patient taking differently: Take 100 mg by mouth daily. ) 60 tablet 2   No current facility-administered medications for this visit.    ALLERGIES:  No Known Allergies  PHYSICAL EXAM:  Performance status (ECOG): 1 - Symptomatic but completely ambulatory  There were no vitals filed for this visit. Wt Readings from Last 3 Encounters:  08/31/19 (!) 399 lb 6.4 oz (181.2 kg)  08/29/19 (!) 400 lb 3.2 oz (181.5 kg)  08/14/19 (!) 441 lb 9.6 oz (200.3 kg)   Physical Exam Vitals reviewed.  Constitutional:      Appearance: Normal appearance.  Neurological:     General: No focal deficit present.     Mental Status: He is alert and oriented to person, place, and time.  Psychiatric:        Mood and Affect: Mood normal.        Behavior: Behavior normal.      LABORATORY DATA:  I have reviewed the labs as listed.  CBC Latest Ref Rng & Units 08/02/2019  WBC 4.0 - 10.5 K/uL 5.6  Hemoglobin 13.0 - 17.0 g/dL 14.8  Hematocrit 39.0 - 52.0 % 45.6  Platelets 150 - 400 K/uL 165   CMP Latest Ref Rng & Units 08/15/2019  Glucose 65 - 99 mg/dL 93  BUN 6 - 24 mg/dL 9  Creatinine 0.76 - 1.27 mg/dL 0.84  Sodium 134 - 144 mmol/L 138  Potassium 3.5 - 5.2 mmol/L 4.6  Chloride 96 - 106 mmol/L 99  CO2 20 - 29 mmol/L 27  Calcium 8.7 - 10.2 mg/dL 9.0  Total Protein 6.0 - 8.5 g/dL 8.0  Total Bilirubin 0.0 - 1.2 mg/dL 1.1  Alkaline Phos 39 - 117 IU/L 98  AST 0 - 40 IU/L 36  ALT 0 - 44 IU/L 26    DIAGNOSTIC IMAGING:  I have independently reviewed the scans and discussed with the patient. US Paracentesis  Result Date: 08/21/2019 INDICATION: Cirrhosis,  ascites EXAM: ULTRASOUND GUIDED DIAGNOSTIC AND THERAPEUTIC PARACENTESIS MEDICATIONS: None COMPLICATIONS: None immediate PROCEDURE: Informed written consent was obtained from the patient after a discussion of the risks, benefits and alternatives to treatment. A timeout was performed prior to the initiation of the procedure. Initial ultrasound scanning demonstrates a large amount of ascites within the right lower abdominal quadrant. The right lower abdomen was prepped and draped in the usual  sterile fashion. 1% lidocaine was used for local anesthesia. Following this, a 5 [place] Yueh catheter was introduced. An ultrasound image was saved for documentation purposes. The paracentesis was performed. The catheter was removed and a dressing was applied. The patient tolerated the procedure well without immediate post procedural complication. Patient received post-procedure intravenous albumin; see nursing notes for details. FINDINGS: A total of approximately 4.8 L of cloudy yellow ascitic fluid was removed. Samples were sent to the laboratory as requested by the clinical team. IMPRESSION: Successful ultrasound-guided paracentesis yielding 4.8 liters of peritoneal fluid. Electronically Signed   By: Lavonia Dana M.D.   On: 08/21/2019 11:09    PET-CT performed 09/05/2019 at [facility]: Impression: -Exam very limited due to artifact from patient's body habitus. 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 This may be percutaneously sampled if clinically indicated. No definite additional metabolically active lesions identified.  ASSESSMENT:  1.  PET positive right anterior Mesenteric mass: -CTAP on 07/25/2019 showed splenomegaly 16 cm, nodular liver consistent with cirrhosis.  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  Possible mild nodular infiltration in the left upper quadrant mesentery and anterior omentum.  Multiple small mesenteric root nodes.  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  Exposure to MEK and possible exposure to asbestos. -LDH and CEA were normal. -PET scan on 09/05/2019 showed 4.2 cm right anterior mesenteric mass with punctate calcifications, SUV 7.2.  Moderate ascites present.  No definite retroperitoneal adenopathy.  Liver is cirrhotic with spleen enlarged.  Diffusely elevated hepatic activity which is nonspecific.  2.  Cirrhosis and ascites: -Thought to be secondary to hepatitis C. -Last paracentesis on 08/21/2019 with 4.8 L removed.  PLAN:  1.  PET positive mesenteric mass: -I have discussed the findings on the PET scan with the patient. -I have recommended CT-guided biopsy of the mesenteric mass. -I will make a referral to radiology at [facility]. -We will see him back after the biopsy to discuss results.  2.  Cirrhosis and ascites: -Thought to be secondary to hep C. -Continue Lasix and spironolactone.  Weight continues to improve.   Orders placed this encounter:  No orders of the defined types were placed in this encounter.    Derek Jack, MD [facility] [phone]   I, Jacqualyn Posey, am acting as a scribe for Dr. Sanda Linger.  I, Derek Jack MD, have reviewed the above documentation for accuracy and completeness, and I agree with the above.

## 2019-09-14 ENCOUNTER — Ambulatory Visit (HOSPITAL_COMMUNITY): Admit: 2019-09-14 | Payer: Medicaid - Out of State | Admitting: Internal Medicine

## 2019-09-14 ENCOUNTER — Encounter (HOSPITAL_COMMUNITY): Payer: Self-pay

## 2019-09-14 ENCOUNTER — Encounter (HOSPITAL_COMMUNITY): Payer: Self-pay | Admitting: Lab

## 2019-09-14 SURGERY — COLONOSCOPY WITH PROPOFOL
Anesthesia: Monitor Anesthesia Care

## 2019-09-14 NOTE — Progress Notes (Unsigned)
Referral sent to [facility] on 6/3  for Biopsy.  Dr will review records and then schedule biopsy.

## 2019-09-19 ENCOUNTER — Encounter (HOSPITAL_COMMUNITY): Payer: Self-pay

## 2019-10-06 ENCOUNTER — Encounter: Payer: Self-pay | Admitting: Hematology

## 2019-10-09 ENCOUNTER — Ambulatory Visit: Payer: Medicaid - Out of State | Admitting: Gastroenterology

## 2019-10-09 ENCOUNTER — Encounter: Payer: Self-pay | Admitting: Hematology

## 2019-10-18 ENCOUNTER — Inpatient Hospital Stay (HOSPITAL_COMMUNITY): Payer: Medicaid - Out of State | Attending: Nurse Practitioner | Admitting: Hematology

## 2019-10-18 DIAGNOSIS — C7A019 Malignant carcinoid tumor of the small intestine, unspecified portion: Secondary | ICD-10-CM | POA: Insufficient documentation

## 2019-10-18 DIAGNOSIS — R188 Other ascites: Secondary | ICD-10-CM | POA: Insufficient documentation

## 2019-10-18 DIAGNOSIS — K7460 Unspecified cirrhosis of liver: Secondary | ICD-10-CM | POA: Insufficient documentation

## 2019-10-18 DIAGNOSIS — C7A00 Malignant carcinoid tumor of unspecified site: Secondary | ICD-10-CM | POA: Diagnosis not present

## 2019-10-18 NOTE — Patient Instructions (Addendum)
[place] at [facility] Discharge Instructions  You were seen today by Dr. Delton Coombes. He went over your recent results, as well as the diagnoses and treatment plans. You will be scheduled to receive your first injection next week. Dr. Delton Coombes will see you back 4 weeks after your first injection for labs and follow up.   Thank you for choosing [place] at [facility] to provide your oncology and hematology care.  To afford each patient quality time with our provider, please arrive at least 15 minutes before your scheduled appointment time.   If you have a lab appointment with the [facility] please come in thru the Main Entrance and check in at the main information desk  You need to re-schedule your appointment should you arrive 10 or more minutes late.  We strive to give you quality time with our providers, and arriving late affects you and other patients whose appointments are after yours.  Also, if you no show three or more times for appointments you may be dismissed from the clinic at the providers discretion.     Again, thank you for choosing [facility].  Our hope is that these requests will decrease the amount of time that you wait before being seen by our physicians.       _____________________________________________________________  Should you have questions after your visit to [facility], please contact our office at [phone] between the hours of 8:00 a.m. and 4:30 p.m.  Voicemails left after 4:00 p.m. will not be returned until the following business day.  For prescription refill requests, have your pharmacy contact our office and allow 72 hours.    Cancer Center Support Programs:   > Cancer Support Group  2nd Tuesday of the month 1pm-2pm, Journey Room

## 2019-10-18 NOTE — Progress Notes (Signed)
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  PRIOR THERAPY: None  CURRENT THERAPY: Under work-up  BRIEF ONCOLOGIC HISTORY:  Oncology History   No history exists.    CANCER STAGING: Cancer Staging No matching staging information was found for the patient.  INTERVAL HISTORY:  [name], a 54 y.o. 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 Kasean was last seen on 09/13/2019.  Today he is accompanied by his wife. He reports that he had the biopsy done on 10/09/2019 at [facility] and reports that it went well. He is still taking the Lasix and spironolactone every morning. His appetite has improved slightly. He denies having any diarrhea, wheezing or flushing. He reports that the abdominal pain is radiating around to the back, which is 6/10 in severity.   REVIEW OF SYSTEMS:  Review of Systems  Constitutional: Positive for appetite change (moderately decreased) and fatigue (depleted).  Respiratory: Negative for wheezing.   Gastrointestinal: Positive for abdominal pain (6/10). Negative for diarrhea.  Musculoskeletal: Positive for back pain (6/10).  Neurological: Positive for dizziness and numbness (R leg falls asleep).  All other systems reviewed and are negative.   PAST MEDICAL/SURGICAL HISTORY:  Past Medical History:  Diagnosis Date  . Cirrhosis ([place])   . Hepatitis C    Ab + 07/28/19. RNA 436,000  . HTN (hypertension)    Past Surgical History:  Procedure Laterality Date  . debridement  Right    per pt, debridement of rt hand    SOCIAL HISTORY:  Social History   Socioeconomic History  . Marital status: Divorced    Spouse name: Not on file  . Number of children: 4  . Years of education: Not on  file  . Highest education level: Not on file  Occupational History  . Occupation: unemployed  Tobacco Use  . Smoking status: Never Smoker  . Smokeless tobacco: Current User    Types: Snuff  . Tobacco comment: started using snuff since 1986  Vaping Use  . Vaping Use: Never used  Substance and Sexual Activity  . Alcohol use: Not Currently    Comment: occasional  . Drug use: Never  . Sexual activity: Not Currently  Other Topics Concern  . Not on file  Social History Narrative  . Not on file   Social Determinants of Health   Financial Resource Strain: Low Risk   . Difficulty of Paying Living Expenses: Not very hard  Food Insecurity: No Food Insecurity  . Worried About Charity fundraiser in the Last Year: Never true  . Ran Out of Food in the Last Year: Never true  Transportation Needs: Unmet Transportation Needs  . Lack of Transportation (Medical): Yes  . Lack of Transportation (Non-Medical): Yes  Physical Activity: Inactive  . Days of Exercise per Week: 0 days  . Minutes of Exercise per Session: 0 min  Stress: Stress Concern Present  . Feeling of Stress : To some extent  Social Connections: Socially Isolated  . Frequency of Communication with Friends and Family: Three times a week  . Frequency of Social Gatherings with Friends and Family: Three times a week  . Attends Religious Services: Never  . Active Member of Clubs or Organizations: No  . Attends Archivist Meetings: Never  .  Marital Status: Divorced  Human resources officer Violence: Not At Risk  . Fear of Current or Ex-Partner: No  . Emotionally Abused: No  . Physically Abused: No  . Sexually Abused: No    FAMILY HISTORY:  Family History  Problem Relation Age of Onset  . Cirrhosis Sister        per patient, due to drug use.   . Alzheimer's disease Mother   . Cancer Mother   . Diabetes Mother   . Congestive Heart Failure Father   . Diabetes Father   . Alzheimer's disease Paternal Grandmother   .  Osteoporosis Paternal Grandmother   . Diabetes Daughter   . Colon cancer Neg Hx     CURRENT MEDICATIONS:  Current Outpatient Medications  Medication Sig Dispense Refill  . Ensure (ENSURE) Take 237 mLs by mouth every evening.    . furosemide (LASIX) 40 MG tablet Take 1 tablet (40 mg total) by mouth 2 times daily. Take 1 tablet first thing in the morning with spironolactone 100 mg and 1 tablet at 4PM with spironolactone 100 mg. (Patient taking differently: Take 40 mg by mouth daily. ) 60 tablet 2  . propranolol (INDERAL) 40 MG tablet Take 40 mg by mouth 2 (two) times daily.    Marland Kitchen spironolactone (ALDACTONE) 100 MG tablet Take 1 tablet (100 mg total) by mouth 2 times daily. Take 1 tablet first thing in the morning with Lasix 40 mg and 1 tablet at 4PM with Lasix 40 mg. (Patient taking differently: Take 100 mg by mouth daily. ) 60 tablet 2   No current facility-administered medications for this visit.    ALLERGIES:  No Known Allergies  PHYSICAL EXAM:  Performance status (ECOG): 1 - Symptomatic but completely ambulatory  Vitals:   10/18/19 1434  BP: 113/69  Pulse: (!) 53  Resp: 18  Temp: (!) 97.3 F (36.3 C)  SpO2: 98%   Wt Readings from Last 3 Encounters:  10/18/19 (!) 172.9 kg (381 lb 3.2 oz)  09/13/19 (!) 175.8 kg (387 lb 8 oz)  08/31/19 (!) 181.2 kg (399 lb 6.4 oz)   Physical Exam Vitals reviewed.  Constitutional:      Appearance: Normal appearance. He is obese.  Cardiovascular:     Rate and Rhythm: Normal rate and regular rhythm.     Pulses: Normal pulses.     Heart sounds: Normal heart sounds.  Pulmonary:     Effort: Pulmonary effort is normal.     Breath sounds: Normal breath sounds.  Abdominal:     Palpations: Abdomen is soft. There is no mass.     Tenderness: There is abdominal tenderness (over biopsy site).  Neurological:     General: No focal deficit present.     Mental Status: He is alert and oriented to person, place, and time.  Psychiatric:        Mood and  Affect: Mood normal.        Behavior: Behavior normal.      LABORATORY DATA:  I have reviewed the labs as listed.  CBC Latest Ref Rng & Units 08/02/2019  WBC 4.0 - 10.5 K/uL 5.6  Hemoglobin 13.0 - 17.0 g/dL 14.8  Hematocrit 39 - 52 % 45.6  Platelets 150 - 400 K/uL 165   CMP Latest Ref Rng & Units 08/15/2019  Glucose 65 - 99 mg/dL 93  BUN 6 - 24 mg/dL 9  Creatinine 0.76 - 1.27 mg/dL 0.84  Sodium 134 - 144 mmol/L 138  Potassium 3.5 - 5.2 mmol/L  4.6  Chloride 96 - 106 mmol/L 99  CO2 20 - 29 mmol/L 27  Calcium 8.7 - 10.2 mg/dL 9.0  Total Protein 6.0 - 8.5 g/dL 8.0  Total Bilirubin 0.0 - 1.2 mg/dL 1.1  Alkaline Phos 39 - 117 IU/L 98  AST 0 - 40 IU/L 36  ALT 0 - 44 IU/L 26    DIAGNOSTIC IMAGING:  I have independently reviewed the scans and discussed with the patient.   ASSESSMENT:  1.   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  Possible mild nodular infiltration in the left upper quadrant mesentery and anterior omentum.  Multiple small mesenteric root nodes. -PET scan on 09/05/2019 showed 4.2 cm right anterior mesenteric mass with punctate calcifications, SUV 7.2.  Moderate ascites present.  No definite retroperitoneal adenopathy.  Liver is cirrhotic with spleen enlarged.  Diffusely elevated hepatic activity which is nonspecific. -CT-guided biopsy of the mesenteric mass on 10/09/2019 at [facility] consistent with well-differentiated neuroendocrine tumor (carcinoid tumor).  Tumor cells are strongly positive for chromogranin and synaptophysin.  Negative for CK7, CK20, TTF-1, PSA, NKX3.1, S100, HMB-45, and CD56.  2.  Cirrhosis and ascites: -Thought to be secondary to hepatitis C. -Last paracentesis on 08/21/2019 with 4.8 L removed.   PLAN:  1.  Well differentiated neuroendocrine tumor of the mesenteric mass: -I  have discussed findings on the biopsy with the patient in detail. -Because of the location and his cirrhosis, he is not a candidate for resection. -Recent PET scan did not show any evidence of primary. -He will require EGD/colonoscopy to evaluate for primary. -We talked about normal management of unresectable well-differentiated neuroendocrine tumor. -He has abdominal pain radiating from the front to the back on the right side.  He does not have any signs or symptoms of carcinoid syndrome. -We will also consider doing gallium-68 dotatate PET scan to evaluate for primary. -I have recommended treatment with octreotide/lanreotide. -We will also obtain serum chromogranin and 24-hour urine for 5-HIAA for monitoring of his disease.  2.  Cirrhosis and ascites: -Thought to be secondary to hepatitis C. -He will also need EGD for variceal screening.  He is in the process of finding a gastroenterologist in [place] who can treat his hepatitis C and also do endoscopies. -Continue Lasix and spironolactone.  Weight is continuing to improve.   Orders placed this encounter:  No orders of the defined types were placed in this encounter.    Derek Jack, MD [place] [phone]   I, Milinda Antis, am acting as a scribe for Dr. Sanda Linger.  I, Derek Jack MD, have reviewed the above documentation for accuracy and completeness, and I agree with the above.

## 2019-10-27 ENCOUNTER — Ambulatory Visit (HOSPITAL_COMMUNITY): Payer: Medicaid - Out of State

## 2019-11-03 ENCOUNTER — Other Ambulatory Visit (HOSPITAL_COMMUNITY): Payer: Self-pay

## 2019-11-03 ENCOUNTER — Other Ambulatory Visit (HOSPITAL_COMMUNITY): Payer: Self-pay | Admitting: Nurse Practitioner

## 2019-11-03 DIAGNOSIS — C7A00 Malignant carcinoid tumor of unspecified site: Secondary | ICD-10-CM

## 2019-11-03 NOTE — Progress Notes (Signed)
Gallium 68 PET scan orders placed per Dr. Delton Coombes. Order faxed to [facility].

## 2019-11-06 ENCOUNTER — Encounter (HOSPITAL_COMMUNITY): Payer: Self-pay

## 2019-11-08 ENCOUNTER — Encounter: Payer: Self-pay | Admitting: Hematology

## 2019-11-08 ENCOUNTER — Other Ambulatory Visit (HOSPITAL_COMMUNITY): Payer: Self-pay | Admitting: Hematology

## 2019-11-08 ENCOUNTER — Other Ambulatory Visit (HOSPITAL_COMMUNITY): Payer: Self-pay | Admitting: *Deleted

## 2019-11-08 DIAGNOSIS — C7A00 Malignant carcinoid tumor of unspecified site: Secondary | ICD-10-CM

## 2019-11-08 DIAGNOSIS — K6389 Other specified diseases of intestine: Secondary | ICD-10-CM

## 2019-11-09 ENCOUNTER — Inpatient Hospital Stay (HOSPITAL_COMMUNITY): Payer: Medicaid - Out of State

## 2019-11-09 ENCOUNTER — Ambulatory Visit (HOSPITAL_COMMUNITY): Payer: Medicaid - Out of State

## 2019-11-09 ENCOUNTER — Ambulatory Visit (HOSPITAL_COMMUNITY): Payer: Medicaid - Out of State | Admitting: Hematology

## 2019-11-10 ENCOUNTER — Other Ambulatory Visit (HOSPITAL_COMMUNITY): Payer: Self-pay

## 2019-11-10 DIAGNOSIS — K6389 Other specified diseases of intestine: Secondary | ICD-10-CM

## 2019-11-10 DIAGNOSIS — C7A00 Malignant carcinoid tumor of unspecified site: Secondary | ICD-10-CM

## 2019-11-13 ENCOUNTER — Inpatient Hospital Stay (HOSPITAL_BASED_OUTPATIENT_CLINIC_OR_DEPARTMENT_OTHER): Payer: Medicaid - Out of State | Admitting: Hematology

## 2019-11-13 ENCOUNTER — Other Ambulatory Visit: Payer: Self-pay

## 2019-11-13 ENCOUNTER — Inpatient Hospital Stay (HOSPITAL_COMMUNITY): Payer: Medicaid - Out of State | Attending: Hematology

## 2019-11-13 ENCOUNTER — Inpatient Hospital Stay (HOSPITAL_COMMUNITY): Payer: Medicaid - Out of State

## 2019-11-13 VITALS — BP 113/77 | HR 60 | Temp 96.9°F | Resp 20 | Wt 386.4 lb

## 2019-11-13 DIAGNOSIS — R188 Other ascites: Secondary | ICD-10-CM | POA: Diagnosis not present

## 2019-11-13 DIAGNOSIS — K6389 Other specified diseases of intestine: Secondary | ICD-10-CM

## 2019-11-13 DIAGNOSIS — K7460 Unspecified cirrhosis of liver: Secondary | ICD-10-CM

## 2019-11-13 DIAGNOSIS — M545 Low back pain: Secondary | ICD-10-CM | POA: Diagnosis not present

## 2019-11-13 DIAGNOSIS — C7A00 Malignant carcinoid tumor of unspecified site: Secondary | ICD-10-CM | POA: Diagnosis not present

## 2019-11-13 DIAGNOSIS — C7A019 Malignant carcinoid tumor of the small intestine, unspecified portion: Secondary | ICD-10-CM | POA: Diagnosis not present

## 2019-11-13 DIAGNOSIS — R5383 Other fatigue: Secondary | ICD-10-CM | POA: Diagnosis not present

## 2019-11-13 DIAGNOSIS — I10 Essential (primary) hypertension: Secondary | ICD-10-CM | POA: Diagnosis not present

## 2019-11-13 DIAGNOSIS — B1920 Unspecified viral hepatitis C without hepatic coma: Secondary | ICD-10-CM | POA: Diagnosis not present

## 2019-11-13 LAB — CBC WITH DIFFERENTIAL/PLATELET
Abs Immature Granulocytes: 0.01 10*3/uL (ref 0.00–0.07)
Basophils Absolute: 0 10*3/uL (ref 0.0–0.1)
Basophils Relative: 1 %
Eosinophils Absolute: 0.2 10*3/uL (ref 0.0–0.5)
Eosinophils Relative: 3 %
HCT: 44.3 % (ref 39.0–52.0)
Hemoglobin: 14.5 g/dL (ref 13.0–17.0)
Immature Granulocytes: 0 %
Lymphocytes Relative: 36 %
Lymphs Abs: 2.3 10*3/uL (ref 0.7–4.0)
MCH: 33.4 pg (ref 26.0–34.0)
MCHC: 32.7 g/dL (ref 30.0–36.0)
MCV: 102.1 fL — ABNORMAL HIGH (ref 80.0–100.0)
Monocytes Absolute: 0.6 10*3/uL (ref 0.1–1.0)
Monocytes Relative: 10 %
Neutro Abs: 3.2 10*3/uL (ref 1.7–7.7)
Neutrophils Relative %: 50 %
Platelets: 132 10*3/uL — ABNORMAL LOW (ref 150–400)
RBC: 4.34 MIL/uL (ref 4.22–5.81)
RDW: 14 % (ref 11.5–15.5)
WBC: 6.4 10*3/uL (ref 4.0–10.5)
nRBC: 0 % (ref 0.0–0.2)

## 2019-11-13 LAB — COMPREHENSIVE METABOLIC PANEL
ALT: 53 U/L — ABNORMAL HIGH (ref 0–44)
AST: 66 U/L — ABNORMAL HIGH (ref 15–41)
Albumin: 2.7 g/dL — ABNORMAL LOW (ref 3.5–5.0)
Alkaline Phosphatase: 79 U/L (ref 38–126)
Anion gap: 7 (ref 5–15)
BUN: 13 mg/dL (ref 6–20)
CO2: 26 mmol/L (ref 22–32)
Calcium: 8.7 mg/dL — ABNORMAL LOW (ref 8.9–10.3)
Chloride: 104 mmol/L (ref 98–111)
Creatinine, Ser: 0.86 mg/dL (ref 0.61–1.24)
GFR calc Af Amer: >60 mL/min (ref >60)
GFR calc non Af Amer: >60 mL/min (ref >60)
Glucose, Bld: 104 mg/dL — ABNORMAL HIGH (ref 70–99)
Potassium: 4.4 mmol/L (ref 3.5–5.1)
Sodium: 137 mmol/L (ref 135–145)
Total Bilirubin: 1.4 mg/dL — ABNORMAL HIGH (ref 0.3–1.2)
Total Protein: 7.4 g/dL (ref 6.5–8.1)

## 2019-11-13 LAB — LACTATE DEHYDROGENASE: LDH: 147 U/L (ref 98–192)

## 2019-11-13 MED ORDER — LANREOTIDE ACETATE 120 MG/0.5ML ~~LOC~~ SOLN
120.0000 mg | Freq: Once | SUBCUTANEOUS | Status: AC
  Administered 2019-11-13: 120 mg via SUBCUTANEOUS

## 2019-11-13 MED ORDER — FUROSEMIDE 40 MG PO TABS: 40.0000 mg | ORAL_TABLET | Freq: Every day | ORAL | 3 refills | Status: DC

## 2019-11-13 NOTE — Progress Notes (Signed)
Nicolas [name], [place] [address]   CLINIC:  Medical Oncology/Hematology  PCP:  Nicolas [name], [place] / [place] [address] [phone]   REASON FOR VISIT:  Follow-up for well-differentiated neuroendocrine tumor of the mesenteric mass  PRIOR THERAPY: None  NGS Results: Not done  CURRENT THERAPY: Under work-up  BRIEF ONCOLOGIC HISTORY:  Oncology History   No history exists.    CANCER STAGING: Cancer Staging No matching staging information was found for the patient.  INTERVAL HISTORY:  [name], a 54 y.o. 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 Nicolas [name] was last seen on 10/18/2019.  Today he is accompanied by his daughter. He reports that he has gained 5 pounds since his last visit, even though he is trying to eat cleanly; his appetite is good. He has been supplementing his meals with high protein Ensure. He is taking only the spironolactone and not Lasix. He has an appointment for his hepatitis C in 12/2019 in [place].    REVIEW OF SYSTEMS:  Review of Systems  Constitutional: Positive for fatigue (severe). Negative for appetite change.  Musculoskeletal: Positive for back pain (9/10 R lower back pain) and flank pain (R flank pain).  Neurological: Positive for numbness (R leg).  All other systems reviewed and are negative.   PAST MEDICAL/SURGICAL HISTORY:  Past Medical History:  Diagnosis Date  . Cirrhosis ([place])   . Hepatitis C    Ab + 07/28/19. RNA 436,000  . HTN (hypertension)    Past Surgical History:  Procedure Laterality Date  . debridement  Right    per pt, debridement of rt hand    SOCIAL HISTORY:  Social History   Socioeconomic History  . Marital status: Divorced    Spouse name: Not on file  . Number of children: 4  . Years of education: Not on file  . Highest education level: Not on file  Occupational History  .  Occupation: unemployed  Tobacco Use  . Smoking status: Never Smoker  . Smokeless tobacco: Current User    Types: Snuff  . Tobacco comment: started using snuff since 1986  Vaping Use  . Vaping Use: Never used  Substance and Sexual Activity  . Alcohol use: Not Currently    Comment: occasional  . Drug use: Never  . Sexual activity: Not Currently  Other Topics Concern  . Not on file  Social History Narrative  . Not on file   Social Determinants of Health   Financial Resource Strain: Low Risk   . Difficulty of Paying Living Expenses: Not very hard  Food Insecurity: No Food Insecurity  . Worried About Charity fundraiser in the Last Year: Never true  . Ran Out of Food in the Last Year: Never true  Transportation Needs: Unmet Transportation Needs  . Lack of Transportation (Medical): Yes  . Lack of Transportation (Non-Medical): Yes  Physical Activity: Inactive  . Days of Exercise per Week: 0 days  . Minutes of Exercise per Session: 0 min  Stress: Stress Concern Present  . Feeling of Stress : To some extent  Social Connections: Socially Isolated  . Frequency of Communication with Friends and Family: Three times a week  . Frequency of Social Gatherings with Friends and Family: Three times a week  . Attends Religious Services: Never  . Active Member of Clubs or Organizations: No  . Attends Archivist Meetings: Never  . Marital  Status: Divorced  Human resources officer Violence: Not At Risk  . Fear of Current or Ex-Partner: No  . Emotionally Abused: No  . Physically Abused: No  . Sexually Abused: No    FAMILY HISTORY:  Family History  Problem Relation Age of Onset  . Cirrhosis Sister        per patient, due to drug use.   . Alzheimer's disease Mother   . Cancer Mother   . Diabetes Mother   . Congestive Heart Failure Father   . Diabetes Father   . Alzheimer's disease Paternal Grandmother   . Osteoporosis Paternal Grandmother   . Diabetes Daughter   . Colon cancer Neg  Hx     CURRENT MEDICATIONS:  Current Outpatient Medications  Medication Sig Dispense Refill  . Ensure (ENSURE) Take 237 mLs by mouth every evening.    . furosemide (LASIX) 40 MG tablet Take 1 tablet (40 mg total) by mouth 2 times daily. Take 1 tablet first thing in the morning with spironolactone 100 mg and 1 tablet at 4PM with spironolactone 100 mg. (Patient taking differently: Take 40 mg by mouth daily. ) 60 tablet 2  . propranolol (INDERAL) 40 MG tablet Take 40 mg by mouth 2 (two) times daily.    Marland Kitchen spironolactone (ALDACTONE) 100 MG tablet Take 1 tablet (100 mg total) by mouth 2 times daily. Take 1 tablet first thing in the morning with Lasix 40 mg and 1 tablet at 4PM with Lasix 40 mg. (Patient taking differently: Take 100 mg by mouth daily. ) 60 tablet 2   No current facility-administered medications for this visit.   Facility-Administered Medications Ordered in Other Visits  Medication Dose Route Frequency Provider Last Rate Last Admin  . lanreotide acetate (SOMATULINE DEPOT) injection 120 mg  120 mg Subcutaneous Once Nicolas Jack, MD        ALLERGIES:  No Known Allergies  PHYSICAL EXAM:  Performance status (ECOG): 1 - Symptomatic but completely ambulatory  Vitals:   11/13/19 0952  BP: 113/77  Pulse: 60  Resp: 20  Temp: (!) 96.9 F (36.1 C)  SpO2: 95%   Wt Readings from Last 3 Encounters:  11/13/19 (!) 386 lb 6.4 oz (175.3 kg)  10/18/19 (!) 381 lb 3.2 oz (172.9 kg)  09/13/19 (!) 387 lb 8 oz (175.8 kg)   Physical Exam Vitals reviewed.  Constitutional:      Appearance: Normal appearance. He is obese.  Cardiovascular:     Rate and Rhythm: Normal rate and regular rhythm.     Pulses: Normal pulses.     Heart sounds: Normal heart sounds.  Pulmonary:     Effort: Pulmonary effort is normal.     Breath sounds: Normal breath sounds.  Musculoskeletal:     Right lower leg: Edema (1+) present.     Left lower leg: Edema (1+) present.  Neurological:     General: No  focal deficit present.     Mental Status: He is alert and oriented to person, place, and time.  Psychiatric:        Mood and Affect: Mood normal.        Behavior: Behavior normal.      LABORATORY DATA:  I have reviewed the labs as listed.  CBC Latest Ref Rng & Units 11/13/2019 08/02/2019  WBC 4.0 - 10.5 K/uL 6.4 5.6  Hemoglobin 13.0 - 17.0 g/dL 14.5 14.8  Hematocrit 39 - 52 % 44.3 45.6  Platelets 150 - 400 K/uL 132(L) 165   CMP Latest Ref  Rng & Units 11/13/2019 08/15/2019  Glucose 70 - 99 mg/dL 104(H) 93  BUN 6 - 20 mg/dL 13 9  Creatinine 0.61 - 1.24 mg/dL 0.86 0.84  Sodium 135 - 145 mmol/L 137 138  Potassium 3.5 - 5.1 mmol/L 4.4 4.6  Chloride 98 - 111 mmol/L 104 99  CO2 22 - 32 mmol/L 26 27  Calcium 8.9 - 10.3 mg/dL 8.7(L) 9.0  Total Protein 6.5 - 8.1 g/dL 7.4 8.0  Total Bilirubin 0.3 - 1.2 mg/dL 1.4(H) 1.1  Alkaline Phos 38 - 126 U/L 79 98  AST 15 - 41 U/L 66(H) 36  ALT 0 - 44 U/L 53(H) 26   Lab Results  Component Value Date   LDH 147 11/13/2019   LDH 188 08/02/2019   Lab Results  Component Value Date   CEA1 1.2 08/02/2019    DIAGNOSTIC IMAGING:  I have independently reviewed the scans and discussed with the patient. No results found.   ASSESSMENT:  1.  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 Possible mild nodular infiltration in the left upper quadrant mesentery and anterior omentum. Multiple small mesenteric root nodes. -PET scan on 09/05/2019 showed 4.2 cm right anterior mesenteric mass with punctate calcifications, SUV 7.2. Moderate ascites present. No definite retroperitoneal adenopathy. Liver is cirrhotic with spleen enlarged. Diffusely elevated hepatic activity which is nonspecific. -CT-guided biopsy of the mesenteric mass on 10/09/2019 at [facility] consistent with well-differentiated  neuroendocrine tumor (carcinoid tumor).  Tumor cells are strongly positive for chromogranin and synaptophysin.  Negative for CK7, CK20, TTF-1, PSA, NKX3.1, S100, HMB-45, and CD56. -Gallium dotatate PET CT scan on 11/08/2019 at [facility] shows large solid mesenteric midline abdomen mass with increased activity measuring 6.5 cm, SUV of 70.  In the uncinate process there is an area of increased activity visualized since seen on series 603 image 91 measuring 13.7 SUV.  Increased activity throughout the liver and spleen limiting evaluation for metastatic disease.  Hepatic activity is heterogeneous.  2. Cirrhosis and ascites: -Thought to be secondary to hepatitis C. -Last paracentesis on 08/21/2019 with 4.8 L removed.   PLAN:  1.  Well differentiated neuroendocrine tumor of the mesenteric mass: -I have reviewed results of the PET CT scan in detail. -His neuroendocrine tumor is likely arising in the pancreas. -I have recommended checking glucagon, vasoactive intestinal peptide, pancreatic polypeptide, gastrin levels. -We will also start him on lanreotide monthly injections.  We discussed the side effects in detail. -I will see him back in 1 month for follow-up.  2. Cirrhosis and ascites: -He will need EGD for variceal screening.  He has an appointment to see a gastroenterologist in [facility] for EGD and hepatitis C treatment.  3.  Fluid retention: -He is taking spironolactone 100 mg daily.  He has gained 5 pounds since last visit. -I have sent a prescription for Lasix 40 mg to be taken in the mornings.   Orders placed this encounter:  Orders Placed This Encounter  Procedures  . Glucagon  . Vasoactive intestinal peptide (VIP)  . Pancreatic Polypeptide  . Gastrin     Nicolas Jack, MD [place] [phone]   I, Milinda Antis, am acting as a scribe for Dr. Sanda Linger.  I, Nicolas Jack MD, have reviewed the above  documentation for accuracy and completeness, and I agree with the above.

## 2019-11-13 NOTE — Progress Notes (Signed)
11/14/19  PA cleared to proceed by Lendell Caprice.  Nicolas [name], PharmD

## 2019-11-13 NOTE — Patient Instructions (Addendum)
[place] at [facility] Discharge Instructions  You were seen today by Dr. Delton Coombes. He went over your recent results and scans. You received your octreotide injection today. Take the furosemide and spironolactone once every day as needed for swelling. Eat plenty of high-protein foods, like eggs and meats, to increase your albumin and protein levels; supplement your meals with high-protein Ensure shakes. Dr. Delton Coombes will see you back in 1 month for labs and follow up.   Thank you for choosing [place] at [facility] to provide your oncology and hematology care.  To afford each patient quality time with our provider, please arrive at least 15 minutes before your scheduled appointment time.   If you have a lab appointment with the [place] please come in thru the Main Entrance and check in at the main information desk  You need to re-schedule your appointment should you arrive 10 or more minutes late.  We strive to give you quality time with our providers, and arriving late affects you and other patients whose appointments are after yours.  Also, if you no show three or more times for appointments you may be dismissed from the clinic at the providers discretion.     Again, thank you for choosing [facility].  Our hope is that these requests will decrease the amount of time that you wait before being seen by our physicians.       _____________________________________________________________  Should you have questions after your visit to [facility], please contact our office at [phone] between the hours of 8:00 a.m. and 4:30 p.m.  Voicemails left after 4:00 p.m. will not be returned until the following business day.  For prescription refill requests, have your pharmacy contact our office and allow 72 hours.    Cancer Center Support Programs:   > Cancer Support Group  2nd Tuesday of the month 1pm-2pm, Journey  Room

## 2019-11-13 NOTE — Progress Notes (Signed)
Patient tolerated Lanreotide injection with no complaints voiced.  Site clean and dry with no bruising or swelling noted at site.  Band aid applied.  VSS with discharge and left ambulatory with no s/s of distress noted. 

## 2019-11-14 LAB — GASTRIN: Gastrin: 14 pg/mL (ref 0–115)

## 2019-11-14 LAB — CEA: CEA: 1.6 ng/mL (ref 0.0–4.7)

## 2019-11-22 LAB — PANCREATIC POLYPEPTIDE: Pancreatic Polypeptide: 207.1 pg/mL (ref 0.0–418.0)

## 2019-11-24 ENCOUNTER — Ambulatory Visit (HOSPITAL_COMMUNITY): Payer: Medicaid - Out of State | Admitting: Hematology

## 2019-11-24 ENCOUNTER — Other Ambulatory Visit (HOSPITAL_COMMUNITY): Payer: Medicaid - Out of State

## 2019-11-24 ENCOUNTER — Ambulatory Visit (HOSPITAL_COMMUNITY): Payer: Medicaid - Out of State

## 2019-12-07 ENCOUNTER — Other Ambulatory Visit (HOSPITAL_COMMUNITY): Payer: Medicaid - Out of State

## 2019-12-07 ENCOUNTER — Ambulatory Visit (HOSPITAL_COMMUNITY): Payer: Medicaid - Out of State

## 2019-12-07 ENCOUNTER — Ambulatory Visit (HOSPITAL_COMMUNITY): Payer: Medicaid - Out of State | Admitting: Hematology

## 2019-12-08 ENCOUNTER — Other Ambulatory Visit (HOSPITAL_COMMUNITY): Payer: Self-pay

## 2019-12-08 DIAGNOSIS — K7460 Unspecified cirrhosis of liver: Secondary | ICD-10-CM

## 2019-12-08 DIAGNOSIS — C7A00 Malignant carcinoid tumor of unspecified site: Secondary | ICD-10-CM

## 2019-12-08 DIAGNOSIS — R188 Other ascites: Secondary | ICD-10-CM

## 2019-12-08 DIAGNOSIS — K6389 Other specified diseases of intestine: Secondary | ICD-10-CM

## 2019-12-11 ENCOUNTER — Inpatient Hospital Stay (HOSPITAL_COMMUNITY): Payer: Medicaid - Out of State

## 2019-12-11 ENCOUNTER — Other Ambulatory Visit (HOSPITAL_COMMUNITY): Payer: Self-pay | Admitting: *Deleted

## 2019-12-11 ENCOUNTER — Other Ambulatory Visit: Payer: Self-pay

## 2019-12-11 ENCOUNTER — Encounter (HOSPITAL_COMMUNITY): Payer: Self-pay | Admitting: Hematology

## 2019-12-11 ENCOUNTER — Inpatient Hospital Stay (HOSPITAL_BASED_OUTPATIENT_CLINIC_OR_DEPARTMENT_OTHER): Payer: Medicaid - Out of State | Admitting: Hematology

## 2019-12-11 VITALS — BP 120/65 | HR 53 | Temp 98.0°F | Resp 18 | Wt 380.7 lb

## 2019-12-11 DIAGNOSIS — R188 Other ascites: Secondary | ICD-10-CM

## 2019-12-11 DIAGNOSIS — K7460 Unspecified cirrhosis of liver: Secondary | ICD-10-CM

## 2019-12-11 DIAGNOSIS — C7A00 Malignant carcinoid tumor of unspecified site: Secondary | ICD-10-CM

## 2019-12-11 DIAGNOSIS — K6389 Other specified diseases of intestine: Secondary | ICD-10-CM

## 2019-12-11 DIAGNOSIS — C7A019 Malignant carcinoid tumor of the small intestine, unspecified portion: Secondary | ICD-10-CM | POA: Diagnosis not present

## 2019-12-11 LAB — CBC WITH DIFFERENTIAL/PLATELET
Abs Immature Granulocytes: 0.04 10*3/uL (ref 0.00–0.07)
Basophils Absolute: 0 10*3/uL (ref 0.0–0.1)
Basophils Relative: 0 %
Eosinophils Absolute: 0.3 10*3/uL (ref 0.0–0.5)
Eosinophils Relative: 4 %
HCT: 43 % (ref 39.0–52.0)
Hemoglobin: 14.2 g/dL (ref 13.0–17.0)
Immature Granulocytes: 1 %
Lymphocytes Relative: 32 %
Lymphs Abs: 2.4 10*3/uL (ref 0.7–4.0)
MCH: 34.5 pg — ABNORMAL HIGH (ref 26.0–34.0)
MCHC: 33 g/dL (ref 30.0–36.0)
MCV: 104.6 fL — ABNORMAL HIGH (ref 80.0–100.0)
Monocytes Absolute: 0.8 10*3/uL (ref 0.1–1.0)
Monocytes Relative: 10 %
Neutro Abs: 4 10*3/uL (ref 1.7–7.7)
Neutrophils Relative %: 53 %
Platelets: 157 10*3/uL (ref 150–400)
RBC: 4.11 MIL/uL — ABNORMAL LOW (ref 4.22–5.81)
RDW: 13.6 % (ref 11.5–15.5)
WBC: 7.6 10*3/uL (ref 4.0–10.5)
nRBC: 0 % (ref 0.0–0.2)

## 2019-12-11 LAB — COMPREHENSIVE METABOLIC PANEL
ALT: 42 U/L (ref 0–44)
AST: 49 U/L — ABNORMAL HIGH (ref 15–41)
Albumin: 2.6 g/dL — ABNORMAL LOW (ref 3.5–5.0)
Alkaline Phosphatase: 95 U/L (ref 38–126)
Anion gap: 7 (ref 5–15)
BUN: 14 mg/dL (ref 6–20)
CO2: 27 mmol/L (ref 22–32)
Calcium: 8.3 mg/dL — ABNORMAL LOW (ref 8.9–10.3)
Chloride: 103 mmol/L (ref 98–111)
Creatinine, Ser: 0.97 mg/dL (ref 0.61–1.24)
GFR calc Af Amer: >60 mL/min (ref >60)
GFR calc non Af Amer: >60 mL/min (ref >60)
Glucose, Bld: 96 mg/dL (ref 70–99)
Potassium: 3.9 mmol/L (ref 3.5–5.1)
Sodium: 137 mmol/L (ref 135–145)
Total Bilirubin: 1.5 mg/dL — ABNORMAL HIGH (ref 0.3–1.2)
Total Protein: 7.7 g/dL (ref 6.5–8.1)

## 2019-12-11 MED ORDER — DIPHENOXYLATE-ATROPINE 2.5-0.025 MG PO TABS: ORAL_TABLET | ORAL | 3 refills | Status: DC

## 2019-12-11 MED ORDER — LANREOTIDE ACETATE 120 MG/0.5ML ~~LOC~~ SOLN
120.0000 mg | Freq: Once | SUBCUTANEOUS | Status: AC
  Administered 2019-12-11: 120 mg via SUBCUTANEOUS

## 2019-12-11 NOTE — Progress Notes (Signed)
Nicolas [name] presents today for injection per the provider's orders.  Lanreotide 120 mg administration without incident; injection site WNL; see MAR for injection details.  Patient tolerated procedure well and without incident.  No questions or complaints noted at this time. 

## 2019-12-11 NOTE — Patient Instructions (Signed)
[place] at [facility] Discharge Instructions  You were seen today by Dr. Delton Coombes. He went over your recent results. You received your injection today. You will be prescribed Lomotil for your diarrhea; at the first episode of diarrhea, take 2 tablets and then take 1 tablet after every watery bowel movement. Dr. Delton Coombes will see you back in 4 weeks for labs and follow up.   Thank you for choosing [place] at [facility] to provide your oncology and hematology care.  To afford each patient quality time with our provider, please arrive at least 15 minutes before your scheduled appointment time.   If you have a lab appointment with the [place] please come in thru the Main Entrance and check in at the main information desk  You need to re-schedule your appointment should you arrive 10 or more minutes late.  We strive to give you quality time with our providers, and arriving late affects you and other patients whose appointments are after yours.  Also, if you no show three or more times for appointments you may be dismissed from the clinic at the providers discretion.     Again, thank you for choosing [facility].  Our hope is that these requests will decrease the amount of time that you wait before being seen by our physicians.       _____________________________________________________________  Should you have questions after your visit to [facility], please contact our office at [phone] between the hours of 8:00 a.m. and 4:30 p.m.  Voicemails left after 4:00 p.m. will not be returned until the following business day.  For prescription refill requests, have your pharmacy contact our office and allow 72 hours.    Cancer Center Support Programs:   > Cancer Support Group  2nd Tuesday of the month 1pm-2pm, Journey Room

## 2019-12-11 NOTE — Progress Notes (Signed)
Patient was assessed by Dr. Delton Coombes and labs have been reviewed.  Patient reports diarrhea since getting his last injection.  He has an appointment with gastroenterologist on Wednesday this week.  Dr. Delton Coombes has sent Lomotil in to his pharmacy.  Okay to proceed with lanreotide injection today. Primary RN and pharmacy aware.

## 2019-12-11 NOTE — Progress Notes (Signed)
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    CANCER STAGING: Cancer Staging No matching staging information was found for the patient.  INTERVAL HISTORY:  [name], a 54 y.o. male, returns for routine follow-up of his well-differentiated tumor of the mesenteric mass. Nicolas [name] was last seen on 11/13/2019.  Today he reports that he had diarrhea about 10 episodes daily starting the day after his lanreotide injection on 8/2 lasting for 3 days; he did not take anything for the diarrhea. He also complains of having lower back pain on his right side which started after his lanreotide injection which is preventing him from lying down to sleep. His appetite has come down and he is forcing himself to eat more; he eats more protein and drinks 1 can of high-protein Ensure daily. He continues taking Lasix and spironolactone daily and takes tramadol for his back pain. He also complains of being more fatigued since getting the injection, which is worse than before the injection. He denies having wheezing or dyspnea.  He will see the gastroenterologist in [place] on 9/1.   REVIEW OF SYSTEMS:  Review of Systems  Constitutional: Positive for appetite change (moderately decreased), chills (after injection) and fatigue (severe).  Respiratory: Negative for shortness of breath and wheezing.   Gastrointestinal: Positive for diarrhea (with injection).  Musculoskeletal: Positive for back pain (6/10 lower back pain).  All other systems reviewed and are negative.   PAST MEDICAL/SURGICAL HISTORY:  Past  Medical History:  Diagnosis Date  . Cirrhosis ([place])   . Hepatitis C    Ab + 07/28/19. RNA 436,000  . HTN (hypertension)    Past Surgical History:  Procedure Laterality Date  . debridement  Right    per pt, debridement of rt hand    SOCIAL HISTORY:  Social History   Socioeconomic History  . Marital status: Divorced    Spouse name: Not on file  . Number of children: 4  . Years of education: Not on file  . Highest education level: Not on file  Occupational History  . Occupation: unemployed  Tobacco Use  . Smoking status: Never Smoker  . Smokeless tobacco: Current User    Types: Snuff  . Tobacco comment: started using snuff since 1986  Vaping Use  . Vaping Use: Never used  Substance and Sexual Activity  . Alcohol use: Not Currently    Comment: occasional  . Drug use: Never  . Sexual activity: Not Currently  Other Topics Concern  . Not on file  Social History Narrative  . Not on file   Social Determinants of Health   Financial Resource Strain: Low Risk   . Difficulty of Paying Living Expenses: Not very hard  Food Insecurity: No Food Insecurity  . Worried About Charity fundraiser in the Last Year: Never true  . Ran Out of Food in the Last Year: Never true  Transportation Needs: Unmet Transportation Needs  . Lack of Transportation (Medical): Yes  . Lack of Transportation (Non-Medical): Yes  Physical Activity: Inactive  . Days of Exercise per Week: 0 days  . Minutes of Exercise per  Session: 0 min  Stress: Stress Concern Present  . Feeling of Stress : To some extent  Social Connections: Socially Isolated  . Frequency of Communication with Friends and Family: Three times a week  . Frequency of Social Gatherings with Friends and Family: Three times a week  . Attends Religious Services: Never  . Active Member of Clubs or Organizations: No  . Attends Archivist Meetings: Never  . Marital Status: Divorced  Human resources officer Violence: Not At Risk  . Fear of  Current or Ex-Partner: No  . Emotionally Abused: No  . Physically Abused: No  . Sexually Abused: No    FAMILY HISTORY:  Family History  Problem Relation Age of Onset  . Cirrhosis Sister        per patient, due to drug use.   . Alzheimer's disease Mother   . Cancer Mother   . Diabetes Mother   . Congestive Heart Failure Father   . Diabetes Father   . Alzheimer's disease Paternal Grandmother   . Osteoporosis Paternal Grandmother   . Diabetes Daughter   . Colon cancer Neg Hx     CURRENT MEDICATIONS:  Current Outpatient Medications  Medication Sig Dispense Refill  . Ensure (ENSURE) Take 237 mLs by mouth every evening.    . furosemide (LASIX) 40 MG tablet Take 1 tablet (40 mg total) by mouth daily. 30 tablet 3  . traMADol (ULTRAM) 50 MG tablet 1 tab(s)    . propranolol (INDERAL) 40 MG tablet Take 40 mg by mouth 2 (two) times daily. (Patient not taking: Reported on 12/11/2019)    . spironolactone (ALDACTONE) 100 MG tablet Take 1 tablet (100 mg total) by mouth 2 times daily. Take 1 tablet first thing in the morning with Lasix 40 mg and 1 tablet at 4PM with Lasix 40 mg. (Patient not taking: Reported on 12/11/2019) 60 tablet 2   No current facility-administered medications for this visit.    ALLERGIES:  No Known Allergies  PHYSICAL EXAM:  Performance status (ECOG): 1 - Symptomatic but completely ambulatory  Vitals:   12/11/19 1425  BP: 120/65  Pulse: (!) 53  Resp: 18  Temp: 98 F (36.7 C)  SpO2: 98%   Wt Readings from Last 3 Encounters:  12/11/19 (!) 380 lb 11.8 oz (172.7 kg)  11/13/19 (!) 386 lb 6.4 oz (175.3 kg)  10/18/19 (!) 381 lb 3.2 oz (172.9 kg)   Physical Exam Vitals reviewed.  Constitutional:      Appearance: Normal appearance.  Cardiovascular:     Rate and Rhythm: Normal rate and regular rhythm.     Pulses: Normal pulses.     Heart sounds: Normal heart sounds.  Pulmonary:     Effort: Pulmonary effort is normal.     Breath sounds: Normal breath sounds.   Musculoskeletal:     Lumbar back: Bony tenderness (R sacroiliac joint TTP) present.  Neurological:     General: No focal deficit present.     Mental Status: He is alert and oriented to person, place, and time.  Psychiatric:        Mood and Affect: Mood normal.        Behavior: Behavior normal.      LABORATORY DATA:  I have reviewed the labs as listed.  CBC Latest Ref Rng & Units 12/11/2019 11/13/2019 08/02/2019  WBC 4.0 - 10.5 K/uL 7.6 6.4 5.6  Hemoglobin 13.0 - 17.0 g/dL 14.2 14.5 14.8  Hematocrit 39 - 52 % 43.0 44.3 45.6  Platelets 150 -  400 K/uL 157 132(L) 165   CMP Latest Ref Rng & Units 12/11/2019 11/13/2019 08/15/2019  Glucose 70 - 99 mg/dL 96 104(H) 93  BUN 6 - 20 mg/dL 14 13 9   Creatinine 0.61 - 1.24 mg/dL 0.97 0.86 0.84  Sodium 135 - 145 mmol/L 137 137 138  Potassium 3.5 - 5.1 mmol/L 3.9 4.4 4.6  Chloride 98 - 111 mmol/L 103 104 99  CO2 22 - 32 mmol/L 27 26 27   Calcium 8.9 - 10.3 mg/dL 8.3(L) 8.7(L) 9.0  Total Protein 6.5 - 8.1 g/dL 7.7 7.4 8.0  Total Bilirubin 0.3 - 1.2 mg/dL 1.5(H) 1.4(H) 1.1  Alkaline Phos 38 - 126 U/L 95 79 98  AST 15 - 41 U/L 49(H) 66(H) 36  ALT 0 - 44 U/L 42 53(H) 26   Lab Results  Component Value Date   CEA1 1.6 11/13/2019   CEA1 1.2 08/02/2019   Lab Results  Component Value Date   LDH 147 11/13/2019   LDH 188 08/02/2019    DIAGNOSTIC IMAGING:  I have independently reviewed the scans and discussed with the patient. No results found.   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 Possible mild nodular infiltration in the left upper quadrant mesentery and anterior omentum. Multiple small mesenteric root nodes. -PET scan on 09/05/2019 showed 4.2 cm right anterior mesenteric mass with punctate calcifications, SUV 7.2. Moderate ascites present. No definite  retroperitoneal adenopathy. Liver is cirrhotic with spleen enlarged. Diffusely elevated hepatic activity which is nonspecific. -CT-guided biopsy of the mesenteric mass on 10/09/2019 at [facility] consistent with well-differentiated neuroendocrine tumor (carcinoid tumor). Tumor cells are strongly positive for chromogranin and synaptophysin. Negative for CK7, CK20, TTF-1, PSA, NKX3.1, S100, HMB-45, and CD56. -Gallium dotatate PET CT scan on 11/08/2019 at [facility] shows large solid mesenteric midline abdomen mass with increased activity measuring 6.5 cm, SUV of 70.  In the uncinate process there is an area of increased activity visualized since seen on series 603 image 91 measuring 13.7 SUV.  Increased activity throughout the liver and spleen limiting evaluation for metastatic disease.  Hepatic activity is heterogeneous. -Lanreotide started on 11/13/2019.  2. Cirrhosis and ascites: -Thought to be secondary to hepatitis C. -Last paracentesis on 08/21/2019 with 4.8 L removed.   PLAN:  1.Well differentiated neuroendocrine tumor of the mesenteric mass: -Lanreotide was started at last visit 4 weeks ago. -He has experienced diarrhea for 4 days described as explosive, 5-10 episodes per day. -I have sent a prescription for Lomotil.  Take 2 tablets at the first onset of diarrhea followed by 1 tablet after each watery bowel movement. -He will proceed with lanreotide today.  We will reevaluate him in 4 weeks.  2. Cirrhosis and ascites: -He will need EGD for variceal screening.  He will also need to see hepatology for hep C treatment. -He has an appointment with GI on 12/13/2019.  3.  Fluid retention: -Continue Lasix 40 mg daily.  He lost about 6 pounds.   Orders placed this encounter:  No orders of the defined types were placed in this encounter.    Derek Jack, MD [place] [phone]   I, Milinda Antis, am acting as a scribe for Dr.  Sanda Linger.  I, Derek Jack MD, have reviewed the above documentation for accuracy and completeness, and I agree with the above.

## 2019-12-11 NOTE — Patient Instructions (Signed)
[facility] at [facility]  Discharge Instructions:   _______________________________________________________________  Thank you for choosing [facility] at [facility] to provide your oncology and hematology care.  To afford each patient quality time with our providers, please arrive at least 15 minutes before your scheduled appointment.  You need to re-schedule your appointment if you arrive 10 or more minutes late.  We strive to give you quality time with our providers, and arriving late affects you and other patients whose appointments are after yours.  Also, if you no show three or more times for appointments you may be dismissed from the clinic.  Again, thank you for choosing [facility] at [facility]. Our hope is that these requests will allow you access to exceptional care and in a timely manner. _______________________________________________________________  If you have questions after your visit, please contact our office at [phone] between the hours of 8:30 a.m. and 5:00 p.m. Voicemails left after 4:30 p.m. will not be returned until the following business day. _______________________________________________________________  For prescription refill requests, have your pharmacy contact our office. _______________________________________________________________  Recommendations made by the consultant and any test results will be sent to your referring physician. _______________________________________________________________ 

## 2020-01-08 ENCOUNTER — Inpatient Hospital Stay (HOSPITAL_BASED_OUTPATIENT_CLINIC_OR_DEPARTMENT_OTHER): Payer: Medicaid - Out of State | Admitting: Hematology

## 2020-01-08 ENCOUNTER — Other Ambulatory Visit: Payer: Self-pay

## 2020-01-08 ENCOUNTER — Inpatient Hospital Stay (HOSPITAL_COMMUNITY): Payer: Medicaid - Out of State | Attending: Hematology

## 2020-01-08 ENCOUNTER — Encounter (HOSPITAL_COMMUNITY): Payer: Self-pay | Admitting: Hematology

## 2020-01-08 ENCOUNTER — Inpatient Hospital Stay (HOSPITAL_COMMUNITY): Payer: Medicaid - Out of State | Attending: Nurse Practitioner

## 2020-01-08 VITALS — BP 122/73 | HR 65 | Temp 97.0°F | Resp 20 | Wt 389.8 lb

## 2020-01-08 DIAGNOSIS — C7A00 Malignant carcinoid tumor of unspecified site: Secondary | ICD-10-CM

## 2020-01-08 DIAGNOSIS — K7460 Unspecified cirrhosis of liver: Secondary | ICD-10-CM | POA: Diagnosis not present

## 2020-01-08 DIAGNOSIS — C7A098 Malignant carcinoid tumors of other sites: Secondary | ICD-10-CM | POA: Diagnosis not present

## 2020-01-08 DIAGNOSIS — C7A019 Malignant carcinoid tumor of the small intestine, unspecified portion: Secondary | ICD-10-CM | POA: Diagnosis present

## 2020-01-08 DIAGNOSIS — R188 Other ascites: Secondary | ICD-10-CM | POA: Diagnosis not present

## 2020-01-08 DIAGNOSIS — Z79899 Other long term (current) drug therapy: Secondary | ICD-10-CM | POA: Diagnosis not present

## 2020-01-08 DIAGNOSIS — R140 Abdominal distension (gaseous): Secondary | ICD-10-CM | POA: Diagnosis not present

## 2020-01-08 LAB — CBC WITH DIFFERENTIAL/PLATELET
Abs Immature Granulocytes: 0.02 10*3/uL (ref 0.00–0.07)
Basophils Absolute: 0.1 10*3/uL (ref 0.0–0.1)
Basophils Relative: 1 %
Eosinophils Absolute: 0.2 10*3/uL (ref 0.0–0.5)
Eosinophils Relative: 2 %
HCT: 44.7 % (ref 39.0–52.0)
Hemoglobin: 14.5 g/dL (ref 13.0–17.0)
Immature Granulocytes: 0 %
Lymphocytes Relative: 35 %
Lymphs Abs: 2.7 10*3/uL (ref 0.7–4.0)
MCH: 34.2 pg — ABNORMAL HIGH (ref 26.0–34.0)
MCHC: 32.4 g/dL (ref 30.0–36.0)
MCV: 105.4 fL — ABNORMAL HIGH (ref 80.0–100.0)
Monocytes Absolute: 0.8 10*3/uL (ref 0.1–1.0)
Monocytes Relative: 11 %
Neutro Abs: 3.9 10*3/uL (ref 1.7–7.7)
Neutrophils Relative %: 51 %
Platelets: 179 10*3/uL (ref 150–400)
RBC: 4.24 MIL/uL (ref 4.22–5.81)
RDW: 13 % (ref 11.5–15.5)
WBC: 7.6 10*3/uL (ref 4.0–10.5)
nRBC: 0 % (ref 0.0–0.2)

## 2020-01-08 LAB — COMPREHENSIVE METABOLIC PANEL
ALT: 30 U/L (ref 0–44)
AST: 42 U/L — ABNORMAL HIGH (ref 15–41)
Albumin: 2.6 g/dL — ABNORMAL LOW (ref 3.5–5.0)
Alkaline Phosphatase: 84 U/L (ref 38–126)
Anion gap: 8 (ref 5–15)
BUN: 12 mg/dL (ref 6–20)
CO2: 28 mmol/L (ref 22–32)
Calcium: 8.5 mg/dL — ABNORMAL LOW (ref 8.9–10.3)
Chloride: 101 mmol/L (ref 98–111)
Creatinine, Ser: 0.95 mg/dL (ref 0.61–1.24)
GFR calc Af Amer: >60 mL/min (ref >60)
GFR calc non Af Amer: >60 mL/min (ref >60)
Glucose, Bld: 115 mg/dL — ABNORMAL HIGH (ref 70–99)
Potassium: 3.7 mmol/L (ref 3.5–5.1)
Sodium: 137 mmol/L (ref 135–145)
Total Bilirubin: 1.5 mg/dL — ABNORMAL HIGH (ref 0.3–1.2)
Total Protein: 8 g/dL (ref 6.5–8.1)

## 2020-01-08 MED ORDER — LANREOTIDE ACETATE 120 MG/0.5ML ~~LOC~~ SOLN
120.0000 mg | Freq: Once | SUBCUTANEOUS | Status: AC
  Administered 2020-01-08: 120 mg via SUBCUTANEOUS

## 2020-01-08 NOTE — Progress Notes (Signed)
Per MD okay to give lanreotide.   Nicolas [name] presents today for injection per the provider's orders.  Lanreotide administration without incident; injection site WNL; see MAR for injection details.  Patient tolerated procedure well and without incident.  No questions or complaints noted at this time. VS stable.  Stable for discharge home ambulatory.

## 2020-01-08 NOTE — Patient Instructions (Signed)
[place] at [facility] Discharge Instructions  You were seen today by Dr. Delton Coombes. He went over your recent results. You received your injection today; continue receiving your monthly injections. Take the Lasix twice daily to decrease the swelling in your abdomen. You may proceed with getting your COVID vaccine. Continue eating protein-dense meals and drink high protein Ensure to increase your protein levels. Dr. Delton Coombes will see you back in 2 months for labs and follow up.   Thank you for choosing [place] at [facility] to provide your oncology and hematology care.  To afford each patient quality time with our provider, please arrive at least 15 minutes before your scheduled appointment time.   If you have a lab appointment with the [facility] please come in thru the Main Entrance and check in at the main information desk  You need to re-schedule your appointment should you arrive 10 or more minutes late.  We strive to give you quality time with our providers, and arriving late affects you and other patients whose appointments are after yours.  Also, if you no show three or more times for appointments you may be dismissed from the clinic at the providers discretion.     Again, thank you for choosing [facility].  Our hope is that these requests will decrease the amount of time that you wait before being seen by our physicians.       _____________________________________________________________  Should you have questions after your visit to [facility], please contact our office at [phone] between the hours of 8:00 a.m. and 4:30 p.m.  Voicemails left after 4:00 p.m. will not be returned until the following business day.  For prescription refill requests, have your pharmacy contact our office and allow 72 hours.    Cancer Center Support Programs:   > Cancer Support Group  2nd Tuesday of the month 1pm-2pm,  Journey Room

## 2020-01-08 NOTE — Progress Notes (Signed)
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    CANCER STAGING: Cancer Staging No matching staging information was found for the patient.  INTERVAL HISTORY:  [name], a 54 y.o. 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 Nicolas [name] was last seen on 12/11/2019.  Today he reports that he feels like his abdomen is bloated around to his back, even though he is eating cleanly and drinking 1 can of High Protein Ensure daily. He had a paracentesis on 6/28. He is taking Lasix daily. He tolerated the lanreotide well and did not have any diarrhea.  He is going to have a CT scan done on 10/2 at [facility] in [place] before starting treatment for his HCV. He is inquiring about getting his COVID vaccine.   REVIEW OF SYSTEMS:  Review of Systems  Constitutional: Positive for appetite change (moderately decreased) and fatigue (severe).  Gastrointestinal: Positive for abdominal distention.  Musculoskeletal: Positive for back pain (10/10 lower back pain w/ kidney stones).  All other systems reviewed and are negative.   PAST MEDICAL/SURGICAL HISTORY:  Past Medical History:  Diagnosis Date  . Cirrhosis ([place])   . Hepatitis C    Ab + 07/28/19. RNA 436,000  . HTN (hypertension)    Past Surgical History:  Procedure Laterality Date  . debridement  Right    per pt, debridement of rt hand    SOCIAL HISTORY:  Social History   Socioeconomic History  . Marital status: Divorced    Spouse name: Not on file  . Number of children: 4   . Years of education: Not on file  . Highest education level: Not on file  Occupational History  . Occupation: unemployed  Tobacco Use  . Smoking status: Never Smoker  . Smokeless tobacco: Current User    Types: Snuff  . Tobacco comment: started using snuff since 1986  Vaping Use  . Vaping Use: Never used  Substance and Sexual Activity  . Alcohol use: Not Currently    Comment: occasional  . Drug use: Never  . Sexual activity: Not Currently  Other Topics Concern  . Not on file  Social History Narrative  . Not on file   Social Determinants of Health   Financial Resource Strain: Low Risk   . Difficulty of Paying Living Expenses: Not very hard  Food Insecurity: No Food Insecurity  . Worried About Charity fundraiser in the Last Year: Never true  . Ran Out of Food in the Last Year: Never true  Transportation Needs: Unmet Transportation Needs  . Lack of Transportation (Medical): Yes  . Lack of Transportation (Non-Medical): Yes  Physical Activity: Inactive  . Days of Exercise per Week: 0 days  . Minutes of Exercise per Session: 0 min  Stress: Stress Concern Present  . Feeling of Stress : To some extent  Social Connections: Socially Isolated  . Frequency of Communication with Friends and Family: Three times a week  . Frequency of Social Gatherings with Friends and Family: Three times a week  . Attends Religious Services: Never  . Active Member of  Clubs or Organizations: No  . Attends Archivist Meetings: Never  . Marital Status: Divorced  Human resources officer Violence: Not At Risk  . Fear of Current or Ex-Partner: No  . Emotionally Abused: No  . Physically Abused: No  . Sexually Abused: No    FAMILY HISTORY:  Family History  Problem Relation Age of Onset  . Cirrhosis Sister        per patient, due to drug use.   . Alzheimer's disease Mother   . Cancer Mother   . Diabetes Mother   . Congestive Heart Failure Father   . Diabetes Father   . Alzheimer's disease  Paternal Grandmother   . Osteoporosis Paternal Grandmother   . Diabetes Daughter   . Colon cancer Neg Hx     CURRENT MEDICATIONS:  Current Outpatient Medications  Medication Sig Dispense Refill  . diphenoxylate-atropine (LOMOTIL) 2.5-0.025 MG tablet Take 2 tablets after first watery stool, then 1 tablet after each watery stool.  No more than 8 tablets daily. 60 tablet 3  . Ensure (ENSURE) Take 237 mLs by mouth every evening.    . furosemide (LASIX) 40 MG tablet Take 1 tablet (40 mg total) by mouth daily. 30 tablet 3  . propranolol (INDERAL) 40 MG tablet Take 40 mg by mouth 2 (two) times daily.     Marland Kitchen spironolactone (ALDACTONE) 100 MG tablet Take 1 tablet (100 mg total) by mouth 2 times daily. Take 1 tablet first thing in the morning with Lasix 40 mg and 1 tablet at 4PM with Lasix 40 mg. 60 tablet 2   No current facility-administered medications for this visit.    ALLERGIES:  No Known Allergies  PHYSICAL EXAM:  Performance status (ECOG): 1 - Symptomatic but completely ambulatory  Vitals:   01/08/20 1319  BP: 122/73  Pulse: 65  Resp: 20  Temp: (!) 97 F (36.1 C)  SpO2: 98%   Wt Readings from Last 3 Encounters:  01/08/20 (!) 389 lb 12.4 oz (176.8 kg)  12/11/19 (!) 380 lb 11.8 oz (172.7 kg)  11/13/19 (!) 386 lb 6.4 oz (175.3 kg)   Physical Exam Vitals reviewed.  Constitutional:      Appearance: Normal appearance. He is obese.  Cardiovascular:     Rate and Rhythm: Normal rate and regular rhythm.     Pulses: Normal pulses.     Heart sounds: Normal heart sounds.  Pulmonary:     Effort: Pulmonary effort is normal.     Breath sounds: Normal breath sounds.  Abdominal:     General: There is distension.     Palpations: There is no mass.     Tenderness: There is abdominal tenderness (R flank TTP).  Neurological:     General: No focal deficit present.     Mental Status: He is alert and oriented to person, place, and time.  Psychiatric:        Mood and Affect: Mood normal.         Behavior: Behavior normal.      LABORATORY DATA:  I have reviewed the labs as listed.  CBC Latest Ref Rng & Units 01/08/2020 12/11/2019 11/13/2019  WBC 4.0 - 10.5 K/uL 7.6 7.6 6.4  Hemoglobin 13.0 - 17.0 g/dL 14.5 14.2 14.5  Hematocrit 39 - 52 % 44.7 43.0 44.3  Platelets 150 - 400 K/uL 179 157 132(L)   CMP Latest Ref Rng & Units 01/08/2020 12/11/2019 11/13/2019  Glucose 70 - 99 mg/dL 115(H) 96 104(H)  BUN 6 - 20 mg/dL  12 14 13   Creatinine 0.61 - 1.24 mg/dL 0.95 0.97 0.86  Sodium 135 - 145 mmol/L 137 137 137  Potassium 3.5 - 5.1 mmol/L 3.7 3.9 4.4  Chloride 98 - 111 mmol/L 101 103 104  CO2 22 - 32 mmol/L 28 27 26   Calcium 8.9 - 10.3 mg/dL 8.5(L) 8.3(L) 8.7(L)  Total Protein 6.5 - 8.1 g/dL 8.0 7.7 7.4  Total Bilirubin 0.3 - 1.2 mg/dL 1.5(H) 1.5(H) 1.4(H)  Alkaline Phos 38 - 126 U/L 84 95 79  AST 15 - 41 U/L 42(H) 49(H) 66(H)  ALT 0 - 44 U/L 30 42 53(H)    DIAGNOSTIC IMAGING:  I have independently reviewed the scans and discussed with the patient. No results found.   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 Possible mild nodular infiltration in the left upper quadrant mesentery and anterior omentum. Multiple small mesenteric root nodes. -PET scan on 09/05/2019 showed 4.2 cm right anterior mesenteric mass with punctate calcifications, SUV 7.2. Moderate ascites present. No definite retroperitoneal adenopathy. Liver is cirrhotic with spleen enlarged. Diffusely elevated hepatic activity which is nonspecific. -CT-guided biopsy of the mesenteric mass on 10/09/2019 at [facility] consistent with well-differentiated neuroendocrine tumor (carcinoid tumor). Tumor cells are strongly positive for chromogranin and synaptophysin. Negative for CK7, CK20, TTF-1, PSA, NKX3.1, S100, HMB-45, and  CD56. -Gallium dotatate PET CT scan on 11/08/2019 at [facility] shows large solid mesenteric midline abdomen mass with increased activity measuring 6.5 cm, SUV of 70. In the uncinate process there is an area of increased activity visualized since seen on series 603 image 91 measuring 13.7 SUV. Increased activity throughout the liver and spleen limiting evaluation for metastatic disease. Hepatic activity is heterogeneous. -Lanreotide started on 11/13/2019.  2. Cirrhosis and ascites: -Thought to be secondary to hepatitis C. -Last paracentesis on 08/21/2019 with 4.8 L removed.   PLAN:  1.Well differentiated neuroendocrine tumor of the mesenteric mass: -He did have diarrhea for 4 days after first shot of lanreotide. -He did not have any problems after the second injection of lanreotide. -Reviewed labs from today which showed mildly elevated AST and a bilirubin of 1.5.  CBC was normal. -He will proceed with lanreotide today.  Plan to see him back in 2 months for follow-up. -Plan to repeat scans after 6 months.  2. Cirrhosis and ascites: -He will also need EGD for variceal screening. -He is reportedly scheduled for a scan this Saturday of the liver. -He was evaluated by Dr. Elsworth Soho of GI on 12/13/2019 in [place].  Plan to start Epclusa.  3. Fluid retention: -He is taking Lasix 40 mg daily. -His weight went up by 9 pounds in the last 4 weeks.  He also has abdominal distention. -We will increase Lasix to 40 mg twice daily. -If there is continued worsening of abdominal distention, will consider paracentesis.  Orders placed this encounter:  No orders of the defined types were placed in this encounter.    Derek Jack, MD [place] [phone]   I, Milinda Antis, am acting as a scribe for Dr. Sanda Linger.  I, Derek Jack MD, have reviewed the above documentation for accuracy and completeness, and I agree with the above.

## 2020-01-08 NOTE — Patient Instructions (Signed)
[facility] at [facility]  Discharge Instructions:   _______________________________________________________________  Thank you for choosing [facility] at [facility] to provide your oncology and hematology care.  To afford each patient quality time with our providers, please arrive at least 15 minutes before your scheduled appointment.  You need to re-schedule your appointment if you arrive 10 or more minutes late.  We strive to give you quality time with our providers, and arriving late affects you and other patients whose appointments are after yours.  Also, if you no show three or more times for appointments you may be dismissed from the clinic.  Again, thank you for choosing [facility]. Our hope is that these requests will allow you access to exceptional care and in a timely manner. _______________________________________________________________  If you have questions after your visit, please contact our office at [phone] between the hours of 8:30 a.m. and 5:00 p.m. Voicemails left after 4:30 p.m. will not be returned until the following business day. _______________________________________________________________  For prescription refill requests, have your pharmacy contact our office. _______________________________________________________________  Recommendations made by the consultant and any test results will be sent to your referring physician. _______________________________________________________________ 

## 2020-01-23 ENCOUNTER — Other Ambulatory Visit (HOSPITAL_COMMUNITY): Payer: Self-pay | Admitting: Hematology

## 2020-01-23 ENCOUNTER — Telehealth (HOSPITAL_COMMUNITY): Payer: Self-pay | Admitting: Surgery

## 2020-01-23 DIAGNOSIS — K7460 Unspecified cirrhosis of liver: Secondary | ICD-10-CM

## 2020-01-23 DIAGNOSIS — R188 Other ascites: Secondary | ICD-10-CM

## 2020-01-23 MED ORDER — TORSEMIDE 20 MG PO TABS: 20.0000 mg | ORAL_TABLET | Freq: Every day | ORAL | 3 refills | Status: DC

## 2020-01-23 MED ORDER — TORSEMIDE 20 MG PO TABS
40.0000 mg | ORAL_TABLET | Freq: Every day | ORAL | 3 refills | Status: DC
Start: 2020-01-23 — End: 2020-11-14

## 2020-01-23 MED ORDER — SPIRONOLACTONE 100 MG PO TABS: ORAL_TABLET | ORAL | 2 refills | Status: DC

## 2020-01-23 NOTE — Telephone Encounter (Signed)
Pt had called earlier today stating that he was having abdominal bloating/distention that was causing him pain.  He also stated that since his last visit he has been doubling his dose of lasix to try to remove the fluid.  Dr. Delton Coombes notified of the pt's condition, and he ordered the pt to stop taking Lasix and start taking Demadex 40 mg daily.  Also, per Dr. Delton Coombes, the pt is to continue taking Spironolactone 100 mg BID.  If the pt is not seeing improvement with these measures, then the pt is to call us back in a week to have a paracentesis.  I called the pt back and he verbalized understanding of these instructions.

## 2020-02-05 ENCOUNTER — Inpatient Hospital Stay (HOSPITAL_COMMUNITY): Payer: Medicaid - Out of State | Attending: Nurse Practitioner

## 2020-02-05 ENCOUNTER — Other Ambulatory Visit: Payer: Self-pay

## 2020-02-05 ENCOUNTER — Encounter (HOSPITAL_COMMUNITY): Payer: Self-pay

## 2020-02-05 VITALS — BP 118/91 | HR 97 | Temp 97.1°F | Resp 18

## 2020-02-05 DIAGNOSIS — C7A00 Malignant carcinoid tumor of unspecified site: Secondary | ICD-10-CM

## 2020-02-05 DIAGNOSIS — C7A019 Malignant carcinoid tumor of the small intestine, unspecified portion: Secondary | ICD-10-CM | POA: Insufficient documentation

## 2020-02-05 MED ORDER — OCTREOTIDE ACETATE 20 MG IM KIT
PACK | INTRAMUSCULAR | Status: AC
  Filled 2020-02-05: qty 1

## 2020-02-05 MED ORDER — LANREOTIDE ACETATE 120 MG/0.5ML ~~LOC~~ SOLN
120.0000 mg | Freq: Once | SUBCUTANEOUS | Status: AC
  Administered 2020-02-05: 120 mg via SUBCUTANEOUS

## 2020-02-05 NOTE — Patient Instructions (Signed)
[facility] at [facility] Discharge Instructions  Received Lanreotide injection today. Follow-up as scheduled   Thank you for choosing [facility] at [facility] to provide your oncology and hematology care.  To afford each patient quality time with our provider, please arrive at least 15 minutes before your scheduled appointment time.   If you have a lab appointment with the Cancer Center please come in thru the Main Entrance and check in at the main information desk.  You need to re-schedule your appointment should you arrive 10 or more minutes late.  We strive to give you quality time with our providers, and arriving late affects you and other patients whose appointments are after yours.  Also, if you no show three or more times for appointments you may be dismissed from the clinic at the providers discretion.     Again, thank you for choosing [facility].  Our hope is that these requests will decrease the amount of time that you wait before being seen by our physicians.       _____________________________________________________________  Should you have questions after your visit to  Cancer Center, please contact our office at [phone] and follow the prompts.  Our office hours are 8:00 a.m. and 4:30 p.m. Monday - Friday.  Please note that voicemails left after 4:00 p.m. may not be returned until the following business day.  We are closed weekends and major holidays.  You do have access to a nurse 24-7, just call the main number to the clinic [phone] and do not press any options, hold on the line and a nurse will answer the phone.    For prescription refill requests, have your pharmacy contact our office and allow 72 hours.    Due to Covid, you will need to wear a mask upon entering the hospital. If you do not have a mask, a mask will be given to you at the Main Entrance upon arrival. For doctor visits, patients may  have 1 support person age 18 or older with them. For treatment visits, patients can not have anyone with them due to social distancing guidelines and our immunocompromised population.     

## 2020-02-05 NOTE — Progress Notes (Signed)
Nicolas [name] tolerated Lanreotide injection well without complaints or incident. VSS Pt discharged self ambulatory in satisfactory condition accompanied by family member

## 2020-03-04 ENCOUNTER — Inpatient Hospital Stay (HOSPITAL_COMMUNITY): Payer: Medicaid - Out of State | Attending: Hematology

## 2020-03-04 ENCOUNTER — Inpatient Hospital Stay (HOSPITAL_COMMUNITY): Payer: Medicaid - Out of State

## 2020-03-04 ENCOUNTER — Other Ambulatory Visit: Payer: Self-pay

## 2020-03-04 ENCOUNTER — Encounter (HOSPITAL_COMMUNITY): Payer: Self-pay

## 2020-03-04 ENCOUNTER — Inpatient Hospital Stay (HOSPITAL_COMMUNITY): Payer: Medicaid - Out of State | Attending: Hematology | Admitting: Hematology

## 2020-03-04 VITALS — BP 122/81 | HR 98 | Temp 97.0°F | Resp 20 | Wt 371.3 lb

## 2020-03-04 DIAGNOSIS — R188 Other ascites: Secondary | ICD-10-CM | POA: Insufficient documentation

## 2020-03-04 DIAGNOSIS — K7460 Unspecified cirrhosis of liver: Secondary | ICD-10-CM | POA: Diagnosis not present

## 2020-03-04 DIAGNOSIS — C7A098 Malignant carcinoid tumors of other sites: Secondary | ICD-10-CM | POA: Insufficient documentation

## 2020-03-04 DIAGNOSIS — C7A00 Malignant carcinoid tumor of unspecified site: Secondary | ICD-10-CM | POA: Diagnosis present

## 2020-03-04 DIAGNOSIS — Z79899 Other long term (current) drug therapy: Secondary | ICD-10-CM | POA: Insufficient documentation

## 2020-03-04 LAB — COMPREHENSIVE METABOLIC PANEL
ALT: 25 U/L (ref 0–44)
AST: 39 U/L (ref 15–41)
Albumin: 2.6 g/dL — ABNORMAL LOW (ref 3.5–5.0)
Alkaline Phosphatase: 92 U/L (ref 38–126)
Anion gap: 8 (ref 5–15)
BUN: 18 mg/dL (ref 6–20)
CO2: 29 mmol/L (ref 22–32)
Calcium: 8.8 mg/dL — ABNORMAL LOW (ref 8.9–10.3)
Chloride: 97 mmol/L — ABNORMAL LOW (ref 98–111)
Creatinine, Ser: 1.29 mg/dL — ABNORMAL HIGH (ref 0.61–1.24)
GFR, Estimated: >60 mL/min (ref >60)
Glucose, Bld: 119 mg/dL — ABNORMAL HIGH (ref 70–99)
Potassium: 4 mmol/L (ref 3.5–5.1)
Sodium: 134 mmol/L — ABNORMAL LOW (ref 135–145)
Total Bilirubin: 1.8 mg/dL — ABNORMAL HIGH (ref 0.3–1.2)
Total Protein: 8.3 g/dL — ABNORMAL HIGH (ref 6.5–8.1)

## 2020-03-04 LAB — CBC WITH DIFFERENTIAL/PLATELET
Abs Immature Granulocytes: 0.03 10*3/uL (ref 0.00–0.07)
Basophils Absolute: 0 10*3/uL (ref 0.0–0.1)
Basophils Relative: 1 %
Eosinophils Absolute: 0.1 10*3/uL (ref 0.0–0.5)
Eosinophils Relative: 2 %
HCT: 44.3 % (ref 39.0–52.0)
Hemoglobin: 14.5 g/dL (ref 13.0–17.0)
Immature Granulocytes: 0 %
Lymphocytes Relative: 33 %
Lymphs Abs: 2.3 10*3/uL (ref 0.7–4.0)
MCH: 33.3 pg (ref 26.0–34.0)
MCHC: 32.7 g/dL (ref 30.0–36.0)
MCV: 101.6 fL — ABNORMAL HIGH (ref 80.0–100.0)
Monocytes Absolute: 0.8 10*3/uL (ref 0.1–1.0)
Monocytes Relative: 11 %
Neutro Abs: 3.7 10*3/uL (ref 1.7–7.7)
Neutrophils Relative %: 53 %
Platelets: 175 10*3/uL (ref 150–400)
RBC: 4.36 MIL/uL (ref 4.22–5.81)
RDW: 13.4 % (ref 11.5–15.5)
WBC: 7 10*3/uL (ref 4.0–10.5)
nRBC: 0 % (ref 0.0–0.2)

## 2020-03-04 MED ORDER — LANREOTIDE ACETATE 120 MG/0.5ML ~~LOC~~ SOLN
SUBCUTANEOUS | Status: AC
  Filled 2020-03-04: qty 120

## 2020-03-04 MED ORDER — LANREOTIDE ACETATE 120 MG/0.5ML ~~LOC~~ SOLN
120.0000 mg | Freq: Once | SUBCUTANEOUS | Status: AC
  Administered 2020-03-04: 120 mg via SUBCUTANEOUS

## 2020-03-04 NOTE — Patient Instructions (Signed)
[place] at [facility] Discharge Instructions  You were seen today by Dr. Delton Coombes. He went over your recent results. You received your lanreotide injection today; continue receiving it every month. Make sure to take the sofosbuvir and antacid at least 4 hours apart. Continue eating protein-dense meals or drink High Protein Boost/Ensure to improve your blood albumin levels and improve your swelling. You will be scheduled for a CT scan of your abdomen before your next visit. Dr. Delton Coombes will see you back in 3 months for labs and follow up.   Thank you for choosing [place] at [facility] to provide your oncology and hematology care.  To afford each patient quality time with our provider, please arrive at least 15 minutes before your scheduled appointment time.   If you have a lab appointment with the [facility] please come in thru the Main Entrance and check in at the main information desk  You need to re-schedule your appointment should you arrive 10 or more minutes late.  We strive to give you quality time with our providers, and arriving late affects you and other patients whose appointments are after yours.  Also, if you no show three or more times for appointments you may be dismissed from the clinic at the providers discretion.     Again, thank you for choosing [facility].  Our hope is that these requests will decrease the amount of time that you wait before being seen by our physicians.       _____________________________________________________________  Should you have questions after your visit to [facility], please contact our office at [phone] between the hours of 8:00 a.m. and 4:30 p.m.  Voicemails left after 4:00 p.m. will not be returned until the following business day.  For prescription refill requests, have your pharmacy contact our office and allow 72 hours.    Cancer Center Support  Programs:   > Cancer Support Group  2nd Tuesday of the month 1pm-2pm, Journey Room

## 2020-03-04 NOTE — Progress Notes (Signed)
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    CANCER STAGING: Cancer Staging No matching staging information was found for the patient.  INTERVAL HISTORY:  [name], a 54 y.o. 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 Clois was last seen on 01/08/2020.   Today he reports feeling well. He started taking sofosbuvir on 11/19 and reports having a burning sensation in his epigastric area which he had prior and is exacerbated; eating helps relieve the burning. He will have to take sofosbuvir for 6 months to finish the course for HCV. He also reports having increased anxiety when he's in a crowd. He continues having a water sensation in his lower abdomen and is taking Demadex daily. He is tolerating the lanreotide well, though he reports having fatigue for 4 days after each shot which improves subsequently. His appetite is good and he reports early satiety but he denies having diarrhea since his very first lanreotide injection.   REVIEW OF SYSTEMS:  Review of Systems  Constitutional: Positive for appetite change (75%) and fatigue (depleted).  Respiratory: Positive for shortness of breath (w/ exertion).   Gastrointestinal: Positive for abdominal distention (sensation of fluid in lower abdomen) and abdominal pain (3/10 lower abdominal burning pain). Negative for diarrhea.  Psychiatric/Behavioral: Positive for sleep disturbance. The patient is nervous/anxious.   All other systems reviewed and are negative.   PAST  MEDICAL/SURGICAL HISTORY:  Past Medical History:  Diagnosis Date  . Cirrhosis ([place])   . Hepatitis C    Ab + 07/28/19. RNA 436,000  . HTN (hypertension)    Past Surgical History:  Procedure Laterality Date  . debridement  Right    per pt, debridement of rt hand    SOCIAL HISTORY:  Social History   Socioeconomic History  . Marital status: Divorced    Spouse name: Not on file  . Number of children: 4  . Years of education: Not on file  . Highest education level: Not on file  Occupational History  . Occupation: unemployed  Tobacco Use  . Smoking status: Never Smoker  . Smokeless tobacco: Current User    Types: Snuff  . Tobacco comment: started using snuff since 1986  Vaping Use  . Vaping Use: Never used  Substance and Sexual Activity  . Alcohol use: Not Currently    Comment: occasional  . Drug use: Never  . Sexual activity: Not Currently  Other Topics Concern  . Not on file  Social History Narrative  . Not on file   Social Determinants of Health   Financial Resource Strain: Low Risk   . Difficulty of Paying Living Expenses: Not very hard  Food Insecurity: No Food Insecurity  . Worried About Charity fundraiser in the Last Year: Never true  . Ran Out of Food in the Last Year: Never true  Transportation Needs: Unmet Transportation Needs  . Lack of Transportation (Medical): Yes  . Lack of Transportation (Non-Medical): Yes  Physical Activity: Inactive  . Days of Exercise per Week: 0 days  . Minutes of Exercise per Session: 0 min  Stress: Stress Concern Present  . Feeling of Stress : To some extent  Social Connections: Socially Isolated  . Frequency of Communication with Friends and Family: Three times a week  . Frequency of Social Gatherings with Friends and Family: Three times a week  . Attends Religious Services: Never  . Active Member of Clubs or Organizations: No  . Attends Archivist Meetings: Never  . Marital Status: Divorced  Human resources officer  Violence: Not At Risk  . Fear of Current or Ex-Partner: No  . Emotionally Abused: No  . Physically Abused: No  . Sexually Abused: No    FAMILY HISTORY:  Family History  Problem Relation Age of Onset  . Cirrhosis Sister        per patient, due to drug use.   . Alzheimer's disease Mother   . Cancer Mother   . Diabetes Mother   . Congestive Heart Failure Father   . Diabetes Father   . Alzheimer's disease Paternal Grandmother   . Osteoporosis Paternal Grandmother   . Diabetes Daughter   . Colon cancer Neg Hx     CURRENT MEDICATIONS:  Current Outpatient Medications  Medication Sig Dispense Refill  . Ensure (ENSURE) Take 237 mLs by mouth every evening.    . Hepatitis A-Hep B Recomb Vac (TWINRIX) 720-20 ELU-MCG/ML injection Inject into the muscle.    . Sofosbuvir-Velpatasvir 400-100 MG TABS Take 1 tablet by mouth daily.    Marland Kitchen spironolactone (ALDACTONE) 100 MG tablet Take 1 tablet (100 mg total) by mouth 2 times daily. Take 1 tablet first thing in the morning with demadex 40mg  and 1 tablet at 4PM. 60 tablet 2  . torsemide (DEMADEX) 20 MG tablet Take 2 tablets (40 mg total) by mouth daily. 60 tablet 3  . diphenoxylate-atropine (LOMOTIL) 2.5-0.025 MG tablet Take 2 tablets after first watery stool, then 1 tablet after each watery stool.  No more than 8 tablets daily. (Patient not taking: Reported on 03/04/2020) 60 tablet 3   No current facility-administered medications for this visit.    ALLERGIES:  No Known Allergies  PHYSICAL EXAM:  Performance status (ECOG): 1 - Symptomatic but completely ambulatory  Vitals:   03/04/20 1126  BP: 122/81  Pulse: 98  Resp: 20  Temp: (!) 97 F (36.1 C)  SpO2: 97%   Wt Readings from Last 3 Encounters:  03/04/20 (!) 371 lb 4.1 oz (168.4 kg)  01/08/20 (!) 389 lb 12.4 oz (176.8 kg)  12/11/19 (!) 380 lb 11.8 oz (172.7 kg)   Physical Exam Vitals reviewed.  Constitutional:      Appearance: Normal appearance. He is obese.  Cardiovascular:      Rate and Rhythm: Normal rate and regular rhythm.     Pulses: Normal pulses.     Heart sounds: Normal heart sounds.  Pulmonary:     Effort: Pulmonary effort is normal.     Breath sounds: Normal breath sounds.  Abdominal:     General: There is distension.     Palpations: Abdomen is soft. There is no mass.     Tenderness: There is no abdominal tenderness.     Hernia: No hernia is present.  Musculoskeletal:     Right lower leg: Edema (2+) present.     Left lower leg: Edema (2+) present.  Neurological:     General: No focal deficit present.     Mental Status: He is alert and oriented to person, place, and time.  Psychiatric:        Mood  and Affect: Mood normal.        Behavior: Behavior normal.      LABORATORY DATA:  I have reviewed the labs as listed.  CBC Latest Ref Rng & Units 03/04/2020 01/08/2020 12/11/2019  WBC 4.0 - 10.5 K/uL 7.0 7.6 7.6  Hemoglobin 13.0 - 17.0 g/dL 14.5 14.5 14.2  Hematocrit 39 - 52 % 44.3 44.7 43.0  Platelets 150 - 400 K/uL 175 179 157   CMP Latest Ref Rng & Units 03/04/2020 01/08/2020 12/11/2019  Glucose 70 - 99 mg/dL 119(H) 115(H) 96  BUN 6 - 20 mg/dL 18 12 14   Creatinine 0.61 - 1.24 mg/dL 1.29(H) 0.95 0.97  Sodium 135 - 145 mmol/L 134(L) 137 137  Potassium 3.5 - 5.1 mmol/L 4.0 3.7 3.9  Chloride 98 - 111 mmol/L 97(L) 101 103  CO2 22 - 32 mmol/L 29 28 27   Calcium 8.9 - 10.3 mg/dL 8.8(L) 8.5(L) 8.3(L)  Total Protein 6.5 - 8.1 g/dL 8.3(H) 8.0 7.7  Total Bilirubin 0.3 - 1.2 mg/dL 1.8(H) 1.5(H) 1.5(H)  Alkaline Phos 38 - 126 U/L 92 84 95  AST 15 - 41 U/L 39 42(H) 49(H)  ALT 0 - 44 U/L 25 30 42    DIAGNOSTIC IMAGING:  I have independently reviewed the scans and discussed with the patient. No results found.   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 Possible mild  nodular infiltration in the left upper quadrant mesentery and anterior omentum. Multiple small mesenteric root nodes. -PET scan on 09/05/2019 showed 4.2 cm right anterior mesenteric mass with punctate calcifications, SUV 7.2. Moderate ascites present. No definite retroperitoneal adenopathy. Liver is cirrhotic with spleen enlarged. Diffusely elevated hepatic activity which is nonspecific. -CT-guided biopsy of the mesenteric mass on 10/09/2019 at [facility] consistent with well-differentiated neuroendocrine tumor (carcinoid tumor). Tumor cells are strongly positive for chromogranin and synaptophysin. Negative for CK7, CK20, TTF-1, PSA, NKX3.1, S100, HMB-45, and CD56. -Gallium dotatate PET CT scan on 11/08/2019 at [facility] shows large solid mesenteric midline abdomen mass with increased activity measuring 6.5 cm, SUV of 70. In the uncinate process there is an area of increased activity visualized since seen on series 603 image 91 measuring 13.7 SUV. Increased activity throughout the liver and spleen limiting evaluation for metastatic disease. Hepatic activity is heterogeneous. -Lanreotide started on 11/13/2019.  2. Cirrhosis and ascites: -Thought to be secondary to hepatitis C. -Last paracentesis on 08/21/2019 with 4.8 L removed.   PLAN:  1.Well differentiated neuroendocrine tumor of the mesenteric mass: -He is tolerating lanreotide very well. -Denies any symptoms of carcinoid syndrome. -Reviewed labs from today which showed normal LFTs with elevated total bilirubin 1.8 and albumin low at 2.6.  CBC was normal. -Continue monthly lanreotide. -RTC 3 months.  I plan to repeat chromogranin and CT of the abdomen and pelvis.  2. Cirrhosis and ascites: -He has started sofosbuvir under the direction of Dr. Elsworth Soho on 03/01/2020.  3. Fluid retention: -Lost 18 pounds in the last 1 month. -Cut back torsemide to 20 mg daily. -Continue spironolactone 100 mg twice daily.    Orders placed this encounter:  Orders Placed This Encounter  Procedures  . CT Abdomen Pelvis W Contrast  . Chromogranin A     Derek Jack, MD [place] [phone]   I, Milinda Antis, am acting as a scribe for Dr. Sanda Linger.  I, Derek Jack MD, have reviewed the above documentation for accuracy and completeness, and I agree with the above.

## 2020-03-04 NOTE — Progress Notes (Signed)
Nicolas [name] presents today for injection per the provider's orders.  Lanreotide 120 mg administration without incident; injection site WNL; see MAR for injection details.  Patient tolerated procedure well and without incident.  No questions or complaints noted at this time.

## 2020-03-04 NOTE — Progress Notes (Signed)
CT AP scheduled for 05/14/2020 at 0800 at [facility]. Orders faxed to Kathrene Alu. VM left for patient with appt date and time.

## 2020-04-01 ENCOUNTER — Inpatient Hospital Stay (HOSPITAL_COMMUNITY): Payer: Medicaid - Out of State | Attending: Nurse Practitioner

## 2020-04-01 ENCOUNTER — Other Ambulatory Visit: Payer: Self-pay

## 2020-04-01 VITALS — BP 138/93 | HR 98 | Temp 96.8°F | Resp 18

## 2020-04-01 DIAGNOSIS — C7A019 Malignant carcinoid tumor of the small intestine, unspecified portion: Secondary | ICD-10-CM | POA: Insufficient documentation

## 2020-04-01 DIAGNOSIS — C7A00 Malignant carcinoid tumor of unspecified site: Secondary | ICD-10-CM

## 2020-04-01 MED ORDER — LANREOTIDE ACETATE 120 MG/0.5ML ~~LOC~~ SOLN
120.0000 mg | Freq: Once | SUBCUTANEOUS | Status: AC
  Administered 2020-04-01: 120 mg via SUBCUTANEOUS

## 2020-04-01 NOTE — Progress Notes (Signed)
Nicolas [name] presents today for injection per the provider's orders.  Lanreotide 120 mg administration without incident; injection site WNL; see MAR for injection details.  Patient tolerated procedure well and without incident.  No questions or complaints noted at this time. Discharged in stable condition ambulatory.

## 2020-04-01 NOTE — Patient Instructions (Signed)
[facility] at [facility]  Discharge Instructions:   _______________________________________________________________  Thank you for choosing [facility] at [facility] to provide your oncology and hematology care.  To afford each patient quality time with our providers, please arrive at least 15 minutes before your scheduled appointment.  You need to re-schedule your appointment if you arrive 10 or more minutes late.  We strive to give you quality time with our providers, and arriving late affects you and other patients whose appointments are after yours.  Also, if you no show three or more times for appointments you may be dismissed from the clinic.  Again, thank you for choosing [facility] at [facility]. Our hope is that these requests will allow you access to exceptional care and in a timely manner. _______________________________________________________________  If you have questions after your visit, please contact our office at [phone] between the hours of 8:30 a.m. and 5:00 p.m. Voicemails left after 4:30 p.m. will not be returned until the following business day. _______________________________________________________________  For prescription refill requests, have your pharmacy contact our office. _______________________________________________________________  Recommendations made by the consultant and any test results will be sent to your referring physician. _______________________________________________________________ 

## 2020-04-20 ENCOUNTER — Other Ambulatory Visit (HOSPITAL_COMMUNITY): Payer: Self-pay | Admitting: Hematology

## 2020-04-20 DIAGNOSIS — K7460 Unspecified cirrhosis of liver: Secondary | ICD-10-CM

## 2020-04-20 DIAGNOSIS — R188 Other ascites: Secondary | ICD-10-CM

## 2020-04-26 ENCOUNTER — Other Ambulatory Visit: Payer: Self-pay

## 2020-04-26 ENCOUNTER — Inpatient Hospital Stay (HOSPITAL_COMMUNITY): Payer: Medicaid - Out of State | Attending: Hematology

## 2020-04-26 VITALS — BP 158/85 | HR 88 | Temp 96.8°F | Resp 20 | Wt 362.8 lb

## 2020-04-26 DIAGNOSIS — Z79899 Other long term (current) drug therapy: Secondary | ICD-10-CM | POA: Diagnosis present

## 2020-04-26 DIAGNOSIS — C7A00 Malignant carcinoid tumor of unspecified site: Secondary | ICD-10-CM

## 2020-04-26 MED ORDER — LANREOTIDE ACETATE 120 MG/0.5ML ~~LOC~~ SOLN
120.0000 mg | Freq: Once | SUBCUTANEOUS | Status: AC
  Administered 2020-04-26: 120 mg via SUBCUTANEOUS
  Filled 2020-04-26: qty 120

## 2020-04-26 NOTE — Progress Notes (Signed)
Nicolas [name] presents today for injection per the provider's orders. Lanreotide 120mg  administration without incident; injection site WNL; see MAR for injection details.  Patient tolerated procedure well and without incident.  No questions or complaints noted at this time. Discharged stable and  ambulatory.

## 2020-04-29 ENCOUNTER — Ambulatory Visit (HOSPITAL_COMMUNITY): Payer: Medicaid - Out of State

## 2020-05-20 ENCOUNTER — Other Ambulatory Visit: Payer: Self-pay

## 2020-05-20 ENCOUNTER — Ambulatory Visit (HOSPITAL_COMMUNITY)
Admission: RE | Admit: 2020-05-20 | Discharge: 2020-05-20 | Disposition: A | Discharge status: Home / Self Care | Payer: Medicaid - Out of State | Source: Ambulatory Visit | Attending: Hematology | Admitting: Hematology

## 2020-05-20 ENCOUNTER — Other Ambulatory Visit (HOSPITAL_COMMUNITY): Payer: Self-pay | Admitting: Hematology

## 2020-05-20 ENCOUNTER — Inpatient Hospital Stay (HOSPITAL_COMMUNITY): Payer: Medicaid - Out of State | Attending: Hematology

## 2020-05-20 DIAGNOSIS — K7460 Unspecified cirrhosis of liver: Secondary | ICD-10-CM

## 2020-05-20 DIAGNOSIS — C7A098 Malignant carcinoid tumors of other sites: Secondary | ICD-10-CM | POA: Diagnosis present

## 2020-05-20 DIAGNOSIS — R188 Other ascites: Secondary | ICD-10-CM | POA: Insufficient documentation

## 2020-05-20 DIAGNOSIS — C7A00 Malignant carcinoid tumor of unspecified site: Secondary | ICD-10-CM | POA: Insufficient documentation

## 2020-05-20 LAB — COMPREHENSIVE METABOLIC PANEL
ALT: 26 U/L (ref 0–44)
AST: 38 U/L (ref 15–41)
Albumin: 2.9 g/dL — ABNORMAL LOW (ref 3.5–5.0)
Alkaline Phosphatase: 110 U/L (ref 38–126)
Anion gap: 7 (ref 5–15)
BUN: 15 mg/dL (ref 6–20)
CO2: 25 mmol/L (ref 22–32)
Calcium: 8.4 mg/dL — ABNORMAL LOW (ref 8.9–10.3)
Chloride: 100 mmol/L (ref 98–111)
Creatinine, Ser: 1.07 mg/dL (ref 0.61–1.24)
GFR, Estimated: >60 mL/min (ref >60)
Glucose, Bld: 112 mg/dL — ABNORMAL HIGH (ref 70–99)
Potassium: 3.8 mmol/L (ref 3.5–5.1)
Sodium: 132 mmol/L — ABNORMAL LOW (ref 135–145)
Total Bilirubin: 2 mg/dL — ABNORMAL HIGH (ref 0.3–1.2)
Total Protein: 8.5 g/dL — ABNORMAL HIGH (ref 6.5–8.1)

## 2020-05-20 LAB — CBC WITH DIFFERENTIAL/PLATELET
Abs Immature Granulocytes: 0.03 10*3/uL (ref 0.00–0.07)
Basophils Absolute: 0.1 10*3/uL (ref 0.0–0.1)
Basophils Relative: 1 %
Eosinophils Absolute: 0.3 10*3/uL (ref 0.0–0.5)
Eosinophils Relative: 5 %
HCT: 41.9 % (ref 39.0–52.0)
Hemoglobin: 14 g/dL (ref 13.0–17.0)
Immature Granulocytes: 0 %
Lymphocytes Relative: 24 %
Lymphs Abs: 1.7 10*3/uL (ref 0.7–4.0)
MCH: 34.5 pg — ABNORMAL HIGH (ref 26.0–34.0)
MCHC: 33.4 g/dL (ref 30.0–36.0)
MCV: 103.2 fL — ABNORMAL HIGH (ref 80.0–100.0)
Monocytes Absolute: 0.6 10*3/uL (ref 0.1–1.0)
Monocytes Relative: 9 %
Neutro Abs: 4.5 10*3/uL (ref 1.7–7.7)
Neutrophils Relative %: 61 %
Platelets: 181 10*3/uL (ref 150–400)
RBC: 4.06 MIL/uL — ABNORMAL LOW (ref 4.22–5.81)
RDW: 13.6 % (ref 11.5–15.5)
WBC: 7.2 10*3/uL (ref 4.0–10.5)
nRBC: 0 % (ref 0.0–0.2)

## 2020-05-20 MED ORDER — IOHEXOL 300 MG/ML  SOLN
150.0000 mL | Freq: Once | INTRAMUSCULAR | Status: AC | PRN
  Administered 2020-05-20: 125 mL via INTRAVENOUS

## 2020-05-22 LAB — CHROMOGRANIN A: Chromogranin A (ng/mL): 184.9 ng/mL — ABNORMAL HIGH (ref 0.0–101.8)

## 2020-05-27 ENCOUNTER — Inpatient Hospital Stay (HOSPITAL_BASED_OUTPATIENT_CLINIC_OR_DEPARTMENT_OTHER): Payer: Medicaid - Out of State | Admitting: Hematology

## 2020-05-27 ENCOUNTER — Encounter (HOSPITAL_COMMUNITY): Payer: Self-pay | Admitting: Hematology

## 2020-05-27 ENCOUNTER — Other Ambulatory Visit: Payer: Self-pay

## 2020-05-27 ENCOUNTER — Inpatient Hospital Stay (HOSPITAL_COMMUNITY): Payer: Medicaid - Out of State

## 2020-05-27 VITALS — BP 131/85 | HR 84 | Temp 96.9°F | Resp 20 | Wt 360.7 lb

## 2020-05-27 DIAGNOSIS — C7A098 Malignant carcinoid tumors of other sites: Secondary | ICD-10-CM | POA: Diagnosis not present

## 2020-05-27 DIAGNOSIS — C7A00 Malignant carcinoid tumor of unspecified site: Secondary | ICD-10-CM

## 2020-05-27 MED ORDER — CLOTRIMAZOLE 1 % EX CREA: 1.0000 "application " | TOPICAL_CREAM | Freq: Two times a day (BID) | CUTANEOUS | 3 refills | Status: AC

## 2020-05-27 MED ORDER — LANREOTIDE ACETATE 120 MG/0.5ML ~~LOC~~ SOLN
120.0000 mg | Freq: Once | SUBCUTANEOUS | Status: AC
  Administered 2020-05-27: 120 mg via SUBCUTANEOUS

## 2020-05-27 NOTE — Progress Notes (Signed)
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    CANCER STAGING: Cancer Staging No matching staging information was found for the patient.  INTERVAL HISTORY:  [name], a 55 y.o. 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 Nicolas [name] was last seen on 03/04/2020.   Today he is accompanied by his wife and he reports feeling fair. He complains of having alternating explosive diarrhea and hard stool with occasional blood with the stool; he is not currently taking anything for diarrhea or constipation. The explosive diarrhea started in January and occurs twice daily for 4 days of the week. His energy levels are depleted. He also has a rash on his right thigh and itching on his back, side and right thigh. He complains of having lower back pain radiating around to the lower abdomen. He has also been having labile mood with spontaneous crying and has become more irritable. He continues taking sofosbuvir and is starting his 4th month. He is taking Lasix and Demadex for swelling as needed.  He is requesting disability paperwork.   REVIEW OF SYSTEMS:  Review of Systems  Constitutional: Positive for appetite change (75%) and fatigue (depleted).  Respiratory: Positive for shortness of breath (w/ exertion).   Gastrointestinal: Positive for constipation (alternating w/ diarrhea w/ occasional blood) and diarrhea (explosive).  Musculoskeletal: Positive for back pain (8/10 lower back & across abdomen).  Skin: Positive for itching  (back, R leg and sides) and rash (on R upper thigh).  Neurological: Positive for dizziness.       Tremors  Psychiatric/Behavioral: Positive for sleep disturbance. The patient is nervous/anxious.        Labile mood  All other systems reviewed and are negative.   PAST MEDICAL/SURGICAL HISTORY:  Past Medical History:  Diagnosis Date  . Cirrhosis ([place])   . Hepatitis C    Ab + 07/28/19. RNA 436,000  . HTN (hypertension)    Past Surgical History:  Procedure Laterality Date  . debridement  Right    per pt, debridement of rt hand    SOCIAL HISTORY:  Social History   Socioeconomic History  . Marital status: Divorced    Spouse name: Not on file  . Number of children: 4  . Years of education: Not on file  . Highest education level: Not on file  Occupational History  . Occupation: unemployed  Tobacco Use  . Smoking status: Never Smoker  . Smokeless tobacco: Current User    Types: Snuff  . Tobacco comment: started using snuff since 1986  Vaping Use  . Vaping Use: Never used  Substance and Sexual Activity  . Alcohol use: Not Currently    Comment: occasional  . Drug use: Never  . Sexual activity: Not Currently  Other Topics Concern  . Not on file  Social History Narrative  . Not on file   Social Determinants of Health   Financial Resource Strain: Low Risk   . Difficulty of Paying Living Expenses: Not very hard  Food Insecurity: No Food Insecurity  . Worried About Charity fundraiser in the Last Year:  Never true  . Ran Out of Food in the Last Year: Never true  Transportation Needs: Unmet Transportation Needs  . Lack of Transportation (Medical): Yes  . Lack of Transportation (Non-Medical): Yes  Physical Activity: Inactive  . Days of Exercise per Week: 0 days  . Minutes of Exercise per Session: 0 min  Stress: Stress Concern Present  . Feeling of Stress : To some extent  Social Connections: Socially Isolated  . Frequency of Communication with Friends and Family: Three  times a week  . Frequency of Social Gatherings with Friends and Family: Three times a week  . Attends Religious Services: Never  . Active Member of Clubs or Organizations: No  . Attends Archivist Meetings: Never  . Marital Status: Divorced  Human resources officer Violence: Not At Risk  . Fear of Current or Ex-Partner: No  . Emotionally Abused: No  . Physically Abused: No  . Sexually Abused: No    FAMILY HISTORY:  Family History  Problem Relation Age of Onset  . Cirrhosis Sister        per patient, due to drug use.   . Alzheimer's disease Mother   . Cancer Mother   . Diabetes Mother   . Congestive Heart Failure Father   . Diabetes Father   . Alzheimer's disease Paternal Grandmother   . Osteoporosis Paternal Grandmother   . Diabetes Daughter   . Colon cancer Neg Hx     CURRENT MEDICATIONS:  Current Outpatient Medications  Medication Sig Dispense Refill  . clotrimazole (LOTRIMIN) 1 % cream Apply 1 application topically 2 (two) times daily. 30 g 3  . diphenoxylate-atropine (LOMOTIL) 2.5-0.025 MG tablet Take 2 tablets after first watery stool, then 1 tablet after each watery stool.  No more than 8 tablets daily. (Patient not taking: No sig reported) 60 tablet 3  . Ensure (ENSURE) Take 237 mLs by mouth every evening. (Patient not taking: Reported on 05/27/2020)    . furosemide (LASIX) 40 MG tablet TAKE 1 TABLET BY MOUTH EVERY DAY 30 tablet 3  . Hepatitis A-Hep B Recomb Vac (TWINRIX) 720-20 ELU-MCG/ML injection Inject into the muscle. Pt has last shot in July    . Sofosbuvir-Velpatasvir 400-100 MG TABS Take 1 tablet by mouth daily.    Marland Kitchen spironolactone (ALDACTONE) 100 MG tablet TAKE 1 TABLET BY MOUTH TWICE DAILY* TAKE 1 TABLET FIRST THING IN THE MORNING WITH TORSEMIDE AND 1 TABLET AT 4PM 60 tablet 2  . torsemide (DEMADEX) 20 MG tablet Take 2 tablets (40 mg total) by mouth daily. 60 tablet 3   No current facility-administered medications for this visit.    ALLERGIES:  No Known  Allergies  PHYSICAL EXAM:  Performance status (ECOG): 1 - Symptomatic but completely ambulatory  Vitals:   05/27/20 1050  BP: 131/85  Pulse: 84  Resp: 20  Temp: (!) 96.9 F (36.1 C)  SpO2: 99%   Wt Readings from Last 3 Encounters:  05/27/20 (!) 360 lb 11.2 oz (163.6 kg)  04/26/20 (!) 362 lb 12.8 oz (164.6 kg)  03/04/20 (!) 371 lb 4.1 oz (168.4 kg)   Physical Exam Vitals reviewed.  Constitutional:      Appearance: Normal appearance. He is obese.  Cardiovascular:     Rate and Rhythm: Normal rate and regular rhythm.     Pulses: Normal pulses.     Heart sounds: Normal heart sounds.  Pulmonary:     Effort: Pulmonary effort is normal.     Breath sounds: Normal breath sounds.  Abdominal:     General: There is no distension.     Palpations: Abdomen is soft.     Tenderness: There is abdominal tenderness in the right upper quadrant, epigastric area and left upper quadrant.  Musculoskeletal:     Right lower leg: No edema.     Left lower leg: No edema.  Skin:    Findings: Rash present.       Neurological:     General: No focal deficit present.     Mental Status: He is alert and oriented to person, place, and time.  Psychiatric:        Mood and Affect: Mood normal.        Behavior: Behavior normal.      LABORATORY DATA:  I have reviewed the labs as listed.  CBC Latest Ref Rng & Units 05/20/2020 03/04/2020 01/08/2020  WBC 4.0 - 10.5 K/uL 7.2 7.0 7.6  Hemoglobin 13.0 - 17.0 g/dL 14.0 14.5 14.5  Hematocrit 39.0 - 52.0 % 41.9 44.3 44.7  Platelets 150 - 400 K/uL 181 175 179   CMP Latest Ref Rng & Units 05/20/2020 03/04/2020 01/08/2020  Glucose 70 - 99 mg/dL 112(H) 119(H) 115(H)  BUN 6 - 20 mg/dL 15 18 12   Creatinine 0.61 - 1.24 mg/dL 1.07 1.29(H) 0.95  Sodium 135 - 145 mmol/L 132(L) 134(L) 137  Potassium 3.5 - 5.1 mmol/L 3.8 4.0 3.7  Chloride 98 - 111 mmol/L 100 97(L) 101  CO2 22 - 32 mmol/L 25 29 28   Calcium 8.9 - 10.3 mg/dL 8.4(L) 8.8(L) 8.5(L)  Total Protein 6.5 - 8.1  g/dL 8.5(H) 8.3(H) 8.0  Total Bilirubin 0.3 - 1.2 mg/dL 2.0(H) 1.8(H) 1.5(H)  Alkaline Phos 38 - 126 U/L 110 92 84  AST 15 - 41 U/L 38 39 42(H)  ALT 0 - 44 U/L 26 25 30     DIAGNOSTIC IMAGING:  I have independently reviewed the scans and discussed with the patient. CT Abdomen Pelvis W Contrast  Result Date: 05/20/2020 CLINICAL DATA:  Well differentiated neuroendocrine tumor. Mesenteric mass. Cirrhosis. Restaging. EXAM: CT ABDOMEN AND PELVIS WITH CONTRAST TECHNIQUE: Multidetector CT imaging of the abdomen and pelvis was performed using the standard protocol following bolus administration of intravenous contrast. CONTRAST:  18mL OMNIPAQUE IOHEXOL 300 MG/ML  SOLN COMPARISON:  07/25/2019. FINDINGS: Lower chest: Unremarkable. Hepatobiliary: Stable appearance nodular liver contour suggesting cirrhosis. No focal parenchymal abnormality noted within the liver. Tiny layering calcified gallstones evident. No intrahepatic or extrahepatic biliary dilation. Pancreas: No focal mass lesion. No dilatation of the main duct. No intraparenchymal cyst. No peripancreatic edema. Spleen: No splenomegaly. No focal mass lesion. Adrenals/Urinary Tract: No adrenal nodule or mass. Right kidney unremarkable. Small right renal stone seen on the previous study is no longer evident. 6 mm nonobstructing stone again noted lower pole left kidney. No ureteral or bladder stones. No secondary changes in either kidney or ureter. Stomach/Bowel: Stomach is unremarkable. No gastric wall thickening. No evidence of outlet obstruction. Duodenum is normally positioned as is the ligament of Treitz. No small bowel wall thickening. No small bowel dilatation. The terminal ileum is normal. The appendix is normal. No gross colonic mass. No colonic wall thickening. Vascular/Lymphatic: No abdominal aortic aneurysm. Duplicated IVC again noted. There is no gastrohepatic or hepatoduodenal ligament lymphadenopathy. No retroperitoneal or mesenteric  lymphadenopathy. 5.3 cm right paramidline mesenteric lesion measured previously is stable today measuring 5.2 x 4.3 cm. Tiny nodules in the gastrocolic ligament and root of the small bowel mesentery are stable in the interval. No  pelvic sidewall lymphadenopathy. Reproductive: The prostate gland and seminal vesicles are unremarkable. Other: Large volume intraperitoneal free fluid is progressive in the interval.55 Musculoskeletal: No worrisome lytic or sclerotic osseous abnormality. Bilateral gynecomastia noted. IMPRESSION: 1. Interval progression of large volume intraperitoneal free fluid. 2. Stable appearance of the right paramidline mesenteric lesion. Numerous small soft tissue nodules in the gastrocolic ligament and small bowel mesentery are stable. 3. Stable appearance nodular liver contour suggesting cirrhosis. 4. Cholelithiasis. 5. Nonobstructing left renal stone. Nonobstructing right renal stone seen previously has passed in the interval. 6. Duplicated IVC. Electronically Signed   By: Misty Stanley M.D.   On: 05/20/2020 12:59     ASSESSMENT:  1.Well-differentiated neuroendocrine tumor of the right anterior mesenteric mass: -CTAP on 07/25/2019 showed splenomegaly 16 cm, nodular liver consistent with cirrhosis. 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 Possible mild nodular infiltration in the left upper quadrant mesentery and anterior omentum. Multiple small mesenteric root nodes. -PET scan on 09/05/2019 showed 4.2 cm right anterior mesenteric mass with punctate calcifications, SUV 7.2. Moderate ascites present. No definite retroperitoneal adenopathy. Liver is cirrhotic with spleen enlarged. Diffusely elevated hepatic activity which is nonspecific. -CT-guided biopsy of the mesenteric mass on 10/09/2019 at [facility] consistent with well-differentiated neuroendocrine tumor (carcinoid tumor). Tumor cells are strongly positive for chromogranin and synaptophysin. Negative for CK7, CK20,  TTF-1, PSA, NKX3.1, S100, HMB-45, and CD56. -Gallium dotatate PET CT scan on 11/08/2019 at [facility] shows large solid mesenteric midline abdomen mass with increased activity measuring 6.5 cm, SUV of 70. In the uncinate process there is an area of increased activity visualized since seen on series 603 image 91 measuring 13.7 SUV. Increased activity throughout the liver and spleen limiting evaluation for metastatic disease. Hepatic activity is heterogeneous. -Lanreotide started on 11/13/2019.  2. Cirrhosis and ascites: -Thought to be secondary to hepatitis C. -Last paracentesis on 08/21/2019 with 4.8 L removed.   PLAN:  1.Well differentiated neuroendocrine tumor of the mesenteric mass: -He is tolerating lanreotide very well.  Complained of some diarrhea for few days followed by constipation.  He notices some blood when he is constipated.  But diarrhea is more intolerable.  He has 2 episodes of diarrhea when it happens.  It happens up to 4days out of 7 days. -Reviewed CT AP from 05/20/2020 which showed stable appearance of right paramidline mesenteric lesion with numerous small soft tissue nodules in the gastrocolic ligament and small bowel mesentery. -I do not believe diarrhea is from his neuroendocrine tumor which is well controlled. -Reviewed labs from 05/20/2020.  Chromogranin is 184.  LFTs show bilirubin of 2.0.  Continue lanreotide.  RTC 2 months with labs. -He has a rash on the right thigh which is erythematous and itching.  It has central clearance typical of tinea.  I have sent clotrimazole cream to be applied twice daily.  2. Cirrhosis and ascites: -He is currently taking sofosbuvir under the direction of Dr. Elsworth Soho in [place]. -He reported diarrhea and irritability.  Both of these side effects can be possible from that medication and the timing also correlates.  I have asked him to call Dr. Sherri Sear office.  3. Fluid retention: -Continue torsemide 20 mg daily and  spironolactone 100 mg twice daily. -He lost about 11 pounds since last visit.   Orders placed this encounter:  No orders of the defined types were placed in this encounter.    Derek Jack, MD [place] [phone]   I, Milinda Antis, am acting as  a scribe for Dr. Sanda Linger.  I, Derek Jack MD, have reviewed the above documentation for accuracy and completeness, and I agree with the above.

## 2020-05-27 NOTE — Progress Notes (Signed)
Patient was assessed by Dr. Delton Coombes and labs have been reviewed.  Okay to proceed with lanreotide injection today. Primary RN and pharmacy aware.

## 2020-05-27 NOTE — Patient Instructions (Signed)
[place] at [facility] Discharge Instructions  You were seen today by Dr. Delton Coombes. He went over your recent results and scans. You received your lanreotide injection today; continue getting it every month. You will be prescribed Lotrimin cream to apply on the rash on your thigh; apply a moisturizing lotion on your dry skin to prevent it from itching. Continue eating protein-dense meals and supplement your meals with protein shakes to maintain your blood albumin levels. Dr. Delton Coombes will see you back in 2 months for labs and follow up.   Thank you for choosing [place] at [facility] to provide your oncology and hematology care.  To afford each patient quality time with our provider, please arrive at least 15 minutes before your scheduled appointment time.   If you have a lab appointment with the [facility] please come in thru the Main Entrance and check in at the main information desk  You need to re-schedule your appointment should you arrive 10 or more minutes late.  We strive to give you quality time with our providers, and arriving late affects you and other patients whose appointments are after yours.  Also, if you no show three or more times for appointments you may be dismissed from the clinic at the providers discretion.     Again, thank you for choosing [facility].  Our hope is that these requests will decrease the amount of time that you wait before being seen by our physicians.       _____________________________________________________________  Should you have questions after your visit to [facility], please contact our office at [phone] between the hours of 8:00 a.m. and 4:30 p.m.  Voicemails left after 4:00 p.m. will not be returned until the following business day.  For prescription refill requests, have your pharmacy contact our office and allow 72 hours.    Cancer Center Support Programs:    > Cancer Support Group  2nd Tuesday of the month 1pm-2pm, Journey Room

## 2020-05-27 NOTE — Progress Notes (Signed)
Patient tolerated Lanreotide injection with no complaints voiced.  Lab work reviewed.  See MAR for details.  Injection site clean and dry with no bruising or swelling noted.  Band aid applied.  VSS.  Patient left in satisfactory condition with no s/s of distress noted.

## 2020-06-24 ENCOUNTER — Inpatient Hospital Stay (HOSPITAL_COMMUNITY): Payer: Medicaid - Out of State | Attending: Nurse Practitioner

## 2020-06-24 ENCOUNTER — Encounter (HOSPITAL_COMMUNITY): Payer: Self-pay

## 2020-06-24 ENCOUNTER — Other Ambulatory Visit: Payer: Self-pay

## 2020-06-24 VITALS — BP 114/64 | HR 57 | Temp 97.2°F | Resp 18 | Wt 333.0 lb

## 2020-06-24 DIAGNOSIS — C7A019 Malignant carcinoid tumor of the small intestine, unspecified portion: Secondary | ICD-10-CM | POA: Diagnosis not present

## 2020-06-24 DIAGNOSIS — C7A00 Malignant carcinoid tumor of unspecified site: Secondary | ICD-10-CM

## 2020-06-24 MED ORDER — LANREOTIDE ACETATE 120 MG/0.5ML ~~LOC~~ SOLN
120.0000 mg | Freq: Once | SUBCUTANEOUS | Status: AC
  Administered 2020-06-24: 120 mg via SUBCUTANEOUS

## 2020-06-24 NOTE — Progress Notes (Signed)
Patient tolerated injection with no complaints voiced.  Site clean and dry with no bruising or swelling noted at site.  See MAR for details.  Band aid applied.  Patient stable during and after injection.  Vss with discharge and left in satisfactory condition with no s/s of distress noted.  

## 2020-06-24 NOTE — Patient Instructions (Signed)
[facility] at [facility]  Discharge Instructions:   _______________________________________________________________  Thank you for choosing [facility] at [facility] to provide your oncology and hematology care.  To afford each patient quality time with our providers, please arrive at least 15 minutes before your scheduled appointment.  You need to re-schedule your appointment if you arrive 10 or more minutes late.  We strive to give you quality time with our providers, and arriving late affects you and other patients whose appointments are after yours.  Also, if you no show three or more times for appointments you may be dismissed from the clinic.  Again, thank you for choosing [facility]. Our hope is that these requests will allow you access to exceptional care and in a timely manner. _______________________________________________________________  If you have questions after your visit, please contact our office at [phone] between the hours of 8:30 a.m. and 5:00 p.m. Voicemails left after 4:30 p.m. will not be returned until the following business day. _______________________________________________________________  For prescription refill requests, have your pharmacy contact our office. _______________________________________________________________  Recommendations made by the consultant and any test results will be sent to your referring physician. _______________________________________________________________ 

## 2020-07-22 ENCOUNTER — Other Ambulatory Visit: Payer: Self-pay

## 2020-07-22 ENCOUNTER — Inpatient Hospital Stay (HOSPITAL_COMMUNITY): Payer: Medicaid - Out of State | Attending: Hematology and Oncology

## 2020-07-22 ENCOUNTER — Inpatient Hospital Stay (HOSPITAL_COMMUNITY): Payer: Medicaid - Out of State

## 2020-07-22 ENCOUNTER — Encounter (HOSPITAL_COMMUNITY): Payer: Self-pay

## 2020-07-22 VITALS — BP 98/71 | HR 59 | Temp 97.0°F | Resp 18 | Wt 325.0 lb

## 2020-07-22 DIAGNOSIS — E340 Carcinoid syndrome: Secondary | ICD-10-CM | POA: Insufficient documentation

## 2020-07-22 DIAGNOSIS — C7A00 Malignant carcinoid tumor of unspecified site: Secondary | ICD-10-CM

## 2020-07-22 DIAGNOSIS — C7A098 Malignant carcinoid tumors of other sites: Secondary | ICD-10-CM | POA: Diagnosis present

## 2020-07-22 DIAGNOSIS — R197 Diarrhea, unspecified: Secondary | ICD-10-CM | POA: Insufficient documentation

## 2020-07-22 LAB — COMPREHENSIVE METABOLIC PANEL
ALT: 27 U/L (ref 0–44)
AST: 31 U/L (ref 15–41)
Albumin: 3.1 g/dL — ABNORMAL LOW (ref 3.5–5.0)
Alkaline Phosphatase: 150 U/L — ABNORMAL HIGH (ref 38–126)
Anion gap: 11 (ref 5–15)
BUN: 44 mg/dL — ABNORMAL HIGH (ref 6–20)
CO2: 24 mmol/L (ref 22–32)
Calcium: 9.3 mg/dL (ref 8.9–10.3)
Chloride: 98 mmol/L (ref 98–111)
Creatinine, Ser: 2.49 mg/dL — ABNORMAL HIGH (ref 0.61–1.24)
GFR, Estimated: 30 mL/min — ABNORMAL LOW (ref >60)
Glucose, Bld: 126 mg/dL — ABNORMAL HIGH (ref 70–99)
Potassium: 4.9 mmol/L (ref 3.5–5.1)
Sodium: 133 mmol/L — ABNORMAL LOW (ref 135–145)
Total Bilirubin: 2.2 mg/dL — ABNORMAL HIGH (ref 0.3–1.2)
Total Protein: 8.6 g/dL — ABNORMAL HIGH (ref 6.5–8.1)

## 2020-07-22 LAB — CBC WITH DIFFERENTIAL/PLATELET
Abs Immature Granulocytes: 0.03 10*3/uL (ref 0.00–0.07)
Basophils Absolute: 0.1 10*3/uL (ref 0.0–0.1)
Basophils Relative: 1 %
Eosinophils Absolute: 0.2 10*3/uL (ref 0.0–0.5)
Eosinophils Relative: 2 %
HCT: 42.9 % (ref 39.0–52.0)
Hemoglobin: 14.8 g/dL (ref 13.0–17.0)
Immature Granulocytes: 0 %
Lymphocytes Relative: 21 %
Lymphs Abs: 1.7 10*3/uL (ref 0.7–4.0)
MCH: 34.7 pg — ABNORMAL HIGH (ref 26.0–34.0)
MCHC: 34.5 g/dL (ref 30.0–36.0)
MCV: 100.5 fL — ABNORMAL HIGH (ref 80.0–100.0)
Monocytes Absolute: 0.9 10*3/uL (ref 0.1–1.0)
Monocytes Relative: 12 %
Neutro Abs: 5.2 10*3/uL (ref 1.7–7.7)
Neutrophils Relative %: 64 %
Platelets: 173 10*3/uL (ref 150–400)
RBC: 4.27 MIL/uL (ref 4.22–5.81)
RDW: 13.3 % (ref 11.5–15.5)
WBC: 8 10*3/uL (ref 4.0–10.5)
nRBC: 0 % (ref 0.0–0.2)

## 2020-07-22 MED ORDER — LANREOTIDE ACETATE 120 MG/0.5ML ~~LOC~~ SOLN
120.0000 mg | Freq: Once | SUBCUTANEOUS | Status: AC
  Administered 2020-07-22: 120 mg via SUBCUTANEOUS

## 2020-07-22 NOTE — Progress Notes (Signed)
Patient tolerated Lanreotide injection with no complaints voiced.  Site clean and dry with no bruising or swelling noted at site.  Band aid applied.  VSS with discharge and left ambulatory with no s/s of distress noted.

## 2020-07-25 LAB — CHROMOGRANIN A: Chromogranin A (ng/mL): 215.5 ng/mL — ABNORMAL HIGH (ref 0.0–101.8)

## 2020-07-29 ENCOUNTER — Inpatient Hospital Stay (HOSPITAL_COMMUNITY): Payer: Medicaid - Out of State | Admitting: Hematology

## 2020-07-29 ENCOUNTER — Ambulatory Visit (HOSPITAL_COMMUNITY): Payer: Medicaid - Out of State | Admitting: Hematology

## 2020-07-29 ENCOUNTER — Other Ambulatory Visit: Payer: Self-pay

## 2020-07-30 ENCOUNTER — Encounter (HOSPITAL_COMMUNITY): Payer: Self-pay | Admitting: Hematology

## 2020-07-30 ENCOUNTER — Inpatient Hospital Stay (HOSPITAL_BASED_OUTPATIENT_CLINIC_OR_DEPARTMENT_OTHER): Payer: Medicaid - Out of State | Admitting: Hematology

## 2020-07-30 DIAGNOSIS — N189 Chronic kidney disease, unspecified: Secondary | ICD-10-CM

## 2020-07-30 DIAGNOSIS — C7A8 Other malignant neuroendocrine tumors: Secondary | ICD-10-CM | POA: Diagnosis not present

## 2020-07-30 NOTE — Progress Notes (Signed)
Virtual Visit via Telephone Note  I connected with Nicolas [name] on 07/30/20 at  4:30 PM EDT by telephone and verified that I am speaking with the correct person using two identifiers.  Location: Patient: At home Provider: In the office   I discussed the limitations, risks, security and privacy concerns of performing an evaluation and management service by telephone and the availability of in person appointments. I also discussed with the patient that there may be a patient responsible charge related to this service. The patient expressed understanding and agreed to proceed.   History of Present Illness: He is seen in our clinic for well-differentiated neuroendocrine tumor of the right anterior mesenteric mass.  He was started on lanreotide on 11/13/2019.   Observations/Objective: He reports that he is having diarrhea alternating with constipation.  He has diarrhea for 1 week followed by constipation.  If he has diarrhea, he is up to 6-7 stools per day.  He reportedly completed sofosbuvir few days ago.  Assessment and Plan:  1.  Well-differentiated neuroendocrine tumor of the mesenteric mass: - Continue lanreotide injections monthly. - We will see him back at the time of next lanreotide injection.  2.  Diarrhea: - This is likely from sofosbuvir.  He reportedly stopped it few days ago. - He has also reached out to GI in [place].  At this time, less likely from carcinoid syndrome.  But will closely watch. - If there is no improvement, will consider GI panel and C. difficile.  3.  Fluid retention: - He is taking torsemide 20 mg daily and spironolactone 100 mg twice daily. - His creatinine has increased to 2.49.  It was 1.07 on 05/20/2020.  BUN is also elevated at 44.  This is likely from overdiuresis. - I have recommended him to stop spironolactone. - I will check BMP next week.   Follow Up Instructions:    I discussed the assessment and treatment plan with the patient. The patient  was provided an opportunity to ask questions and all were answered. The patient agreed with the plan and demonstrated an understanding of the instructions.   The patient was advised to call back or seek an in-person evaluation if the symptoms worsen or if the condition fails to improve as anticipated.  I provided 11 minutes of non-face-to-face time during this encounter.   Derek Jack, MD

## 2020-08-07 ENCOUNTER — Inpatient Hospital Stay (HOSPITAL_COMMUNITY): Payer: Medicaid - Out of State

## 2020-08-07 ENCOUNTER — Encounter (HOSPITAL_COMMUNITY): Payer: Self-pay

## 2020-08-07 ENCOUNTER — Other Ambulatory Visit: Payer: Self-pay

## 2020-08-07 DIAGNOSIS — C7A00 Malignant carcinoid tumor of unspecified site: Secondary | ICD-10-CM

## 2020-08-07 DIAGNOSIS — C7A098 Malignant carcinoid tumors of other sites: Secondary | ICD-10-CM | POA: Diagnosis not present

## 2020-08-07 DIAGNOSIS — N189 Chronic kidney disease, unspecified: Secondary | ICD-10-CM

## 2020-08-07 LAB — COMPREHENSIVE METABOLIC PANEL
ALT: 29 U/L (ref 0–44)
AST: 37 U/L (ref 15–41)
Albumin: 2.9 g/dL — ABNORMAL LOW (ref 3.5–5.0)
Alkaline Phosphatase: 160 U/L — ABNORMAL HIGH (ref 38–126)
Anion gap: 8 (ref 5–15)
BUN: 19 mg/dL (ref 6–20)
CO2: 25 mmol/L (ref 22–32)
Calcium: 8.6 mg/dL — ABNORMAL LOW (ref 8.9–10.3)
Chloride: 104 mmol/L (ref 98–111)
Creatinine, Ser: 1.14 mg/dL (ref 0.61–1.24)
GFR, Estimated: >60 mL/min (ref >60)
Glucose, Bld: 119 mg/dL — ABNORMAL HIGH (ref 70–99)
Potassium: 3.9 mmol/L (ref 3.5–5.1)
Sodium: 137 mmol/L (ref 135–145)
Total Bilirubin: 1.6 mg/dL — ABNORMAL HIGH (ref 0.3–1.2)
Total Protein: 7.5 g/dL (ref 6.5–8.1)

## 2020-08-07 LAB — CBC WITH DIFFERENTIAL/PLATELET
Abs Immature Granulocytes: 0.01 10*3/uL (ref 0.00–0.07)
Basophils Absolute: 0.1 10*3/uL (ref 0.0–0.1)
Basophils Relative: 1 %
Eosinophils Absolute: 0.1 10*3/uL (ref 0.0–0.5)
Eosinophils Relative: 2 %
HCT: 43.7 % (ref 39.0–52.0)
Hemoglobin: 14.3 g/dL (ref 13.0–17.0)
Immature Granulocytes: 0 %
Lymphocytes Relative: 26 %
Lymphs Abs: 1.6 10*3/uL (ref 0.7–4.0)
MCH: 34 pg (ref 26.0–34.0)
MCHC: 32.7 g/dL (ref 30.0–36.0)
MCV: 103.8 fL — ABNORMAL HIGH (ref 80.0–100.0)
Monocytes Absolute: 0.5 10*3/uL (ref 0.1–1.0)
Monocytes Relative: 9 %
Neutro Abs: 3.8 10*3/uL (ref 1.7–7.7)
Neutrophils Relative %: 62 %
Platelets: 162 10*3/uL (ref 150–400)
RBC: 4.21 MIL/uL — ABNORMAL LOW (ref 4.22–5.81)
RDW: 13.2 % (ref 11.5–15.5)
WBC: 6.2 10*3/uL (ref 4.0–10.5)
nRBC: 0 % (ref 0.0–0.2)

## 2020-08-09 LAB — CHROMOGRANIN A: Chromogranin A (ng/mL): 236.1 ng/mL — ABNORMAL HIGH (ref 0.0–101.8)

## 2020-08-22 ENCOUNTER — Ambulatory Visit (HOSPITAL_COMMUNITY): Payer: Medicaid - Out of State

## 2020-08-22 ENCOUNTER — Ambulatory Visit (HOSPITAL_COMMUNITY): Payer: Medicaid - Out of State | Admitting: Hematology

## 2020-08-23 ENCOUNTER — Ambulatory Visit (HOSPITAL_COMMUNITY): Admission: RE | Admit: 2020-08-23 | Payer: Medicaid - Out of State | Source: Ambulatory Visit

## 2020-08-23 ENCOUNTER — Inpatient Hospital Stay (HOSPITAL_COMMUNITY): Payer: Medicaid - Out of State | Attending: Nurse Practitioner | Admitting: Hematology and Oncology

## 2020-08-23 ENCOUNTER — Inpatient Hospital Stay (HOSPITAL_COMMUNITY): Payer: Medicaid - Out of State

## 2020-08-23 ENCOUNTER — Other Ambulatory Visit: Payer: Self-pay

## 2020-08-23 ENCOUNTER — Encounter (HOSPITAL_COMMUNITY): Payer: Self-pay

## 2020-08-23 VITALS — BP 117/72 | HR 87 | Temp 96.8°F | Resp 20 | Wt 355.8 lb

## 2020-08-23 DIAGNOSIS — F410 Panic disorder [episodic paroxysmal anxiety] without agoraphobia: Secondary | ICD-10-CM | POA: Insufficient documentation

## 2020-08-23 DIAGNOSIS — C7A00 Malignant carcinoid tumor of unspecified site: Secondary | ICD-10-CM

## 2020-08-23 DIAGNOSIS — R188 Other ascites: Secondary | ICD-10-CM | POA: Insufficient documentation

## 2020-08-23 DIAGNOSIS — C7A019 Malignant carcinoid tumor of the small intestine, unspecified portion: Secondary | ICD-10-CM | POA: Diagnosis present

## 2020-08-23 DIAGNOSIS — R197 Diarrhea, unspecified: Secondary | ICD-10-CM | POA: Insufficient documentation

## 2020-08-23 DIAGNOSIS — K7460 Unspecified cirrhosis of liver: Secondary | ICD-10-CM

## 2020-08-23 DIAGNOSIS — N200 Calculus of kidney: Secondary | ICD-10-CM | POA: Diagnosis not present

## 2020-08-23 LAB — COMPREHENSIVE METABOLIC PANEL
ALT: 22 U/L (ref 0–44)
AST: 33 U/L (ref 15–41)
Albumin: 2.9 g/dL — ABNORMAL LOW (ref 3.5–5.0)
Alkaline Phosphatase: 147 U/L — ABNORMAL HIGH (ref 38–126)
Anion gap: 7 (ref 5–15)
BUN: 12 mg/dL (ref 6–20)
CO2: 25 mmol/L (ref 22–32)
Calcium: 8.5 mg/dL — ABNORMAL LOW (ref 8.9–10.3)
Chloride: 102 mmol/L (ref 98–111)
Creatinine, Ser: 1.16 mg/dL (ref 0.61–1.24)
GFR, Estimated: >60 mL/min (ref >60)
Glucose, Bld: 121 mg/dL — ABNORMAL HIGH (ref 70–99)
Potassium: 3.8 mmol/L (ref 3.5–5.1)
Sodium: 134 mmol/L — ABNORMAL LOW (ref 135–145)
Total Bilirubin: 1.6 mg/dL — ABNORMAL HIGH (ref 0.3–1.2)
Total Protein: 8.2 g/dL — ABNORMAL HIGH (ref 6.5–8.1)

## 2020-08-23 LAB — CBC WITH DIFFERENTIAL/PLATELET
Abs Immature Granulocytes: 0.03 10*3/uL (ref 0.00–0.07)
Basophils Absolute: 0.1 10*3/uL (ref 0.0–0.1)
Basophils Relative: 1 %
Eosinophils Absolute: 0.1 10*3/uL (ref 0.0–0.5)
Eosinophils Relative: 1 %
HCT: 42.3 % (ref 39.0–52.0)
Hemoglobin: 14 g/dL (ref 13.0–17.0)
Immature Granulocytes: 0 %
Lymphocytes Relative: 24 %
Lymphs Abs: 1.7 10*3/uL (ref 0.7–4.0)
MCH: 34.1 pg — ABNORMAL HIGH (ref 26.0–34.0)
MCHC: 33.1 g/dL (ref 30.0–36.0)
MCV: 103.2 fL — ABNORMAL HIGH (ref 80.0–100.0)
Monocytes Absolute: 0.6 10*3/uL (ref 0.1–1.0)
Monocytes Relative: 9 %
Neutro Abs: 4.7 10*3/uL (ref 1.7–7.7)
Neutrophils Relative %: 65 %
Platelets: 202 10*3/uL (ref 150–400)
RBC: 4.1 MIL/uL — ABNORMAL LOW (ref 4.22–5.81)
RDW: 12.9 % (ref 11.5–15.5)
WBC: 7.2 10*3/uL (ref 4.0–10.5)
nRBC: 0 % (ref 0.0–0.2)

## 2020-08-23 MED ORDER — LANREOTIDE ACETATE 120 MG/0.5ML ~~LOC~~ SOLN
120.0000 mg | Freq: Once | SUBCUTANEOUS | Status: AC
  Administered 2020-08-23: 120 mg via SUBCUTANEOUS

## 2020-08-23 NOTE — Progress Notes (Signed)
Patient tolerated injection with no complaints voiced. Site clean and dry with no bruising or swelling noted at site. See MAR for details. Band aid applied.  Patient stable during and after injection. VSS with discharge and left in satisfactory condition with no s/s of distress noted.  

## 2020-08-23 NOTE — Patient Instructions (Signed)
Nicolas [name]  Discharge Instructions: Thank you for choosing [facility] to provide your oncology and hematology care.  If you have a lab appointment with the [facility], please come in thru the Main Entrance and check in at the main information desk.  Wear comfortable clothing and clothing appropriate for easy access to any Portacath or PICC line.   We strive to give you quality time with your provider. You may need to reschedule your appointment if you arrive late (15 or more minutes).  Arriving late affects you and other patients whose appointments are after yours.  Also, if you miss three or more appointments without notifying the office, you may be dismissed from the clinic at the provider's discretion.      For prescription refill requests, have your pharmacy contact our office and allow 72 hours for refills to be completed.    Today you received Lanreotide injection, return as scheduled.   To help prevent nausea and vomiting after your treatment, we encourage you to take your nausea medication as directed.  BELOW ARE SYMPTOMS THAT SHOULD BE REPORTED IMMEDIATELY: . *FEVER GREATER THAN 100.4 F (38 C) OR HIGHER . *CHILLS OR SWEATING . *NAUSEA AND VOMITING THAT IS NOT CONTROLLED WITH YOUR NAUSEA MEDICATION . *UNUSUAL SHORTNESS OF BREATH . *UNUSUAL BRUISING OR BLEEDING . *URINARY PROBLEMS (pain or burning when urinating, or frequent urination) . *BOWEL PROBLEMS (unusual diarrhea, constipation, pain near the anus) . TENDERNESS IN MOUTH AND THROAT WITH OR WITHOUT PRESENCE OF ULCERS (sore throat, sores in mouth, or a toothache) . UNUSUAL RASH, SWELLING OR PAIN  . UNUSUAL VAGINAL DISCHARGE OR ITCHING   Items with * indicate a potential emergency and should be followed up as soon as possible or go to the Emergency Department if any problems should occur.  Please show the CHEMOTHERAPY ALERT CARD or IMMUNOTHERAPY ALERT CARD at check-in to the Emergency Department  and triage nurse.  Should you have questions after your visit or need to cancel or reschedule your appointment, please contact [facility] [phone]  and follow the prompts.  Office hours are 8:00 a.m. to 4:30 p.m. Monday - Friday. Please note that voicemails left after 4:00 p.m. may not be returned until the following business day.  We are closed weekends and major holidays. You have access to a nurse at all times for urgent questions. Please call the main number to the clinic [phone] and follow the prompts.  For any non-urgent questions, you may also contact your provider using MyChart. We now offer e-Visits for anyone 55 and older to request care online for non-urgent symptoms. For details visit [email or web address].   Also download the MyChart app! Go to the app store, search "MyChart", open the app, select [facility], and log in with your MyChart username and password.  Due to Covid, a mask is required upon entering the hospital/clinic. If you do not have a mask, one will be given to you upon arrival. For doctor visits, patients may have 1 support person aged 55 or older with them. For treatment visits, patients cannot have anyone with them due to current Covid guidelines and our immunocompromised population.

## 2020-08-24 ENCOUNTER — Encounter (HOSPITAL_COMMUNITY): Payer: Self-pay | Admitting: Hematology and Oncology

## 2020-08-24 NOTE — Progress Notes (Signed)
Nicolas [name], [place] [address]   CLINIC:  Medical Oncology/Hematology  PCP:  Olin Hauser, [place] / [place] [address] [phone]   REASON FOR VISIT:  Follow-up for neuroendocrine tumor, on lanreotide.  PRIOR THERAPY: None  NGS Results: Not done  CURRENT THERAPY: Lanreotide monthly  BRIEF ONCOLOGIC HISTORY:  Oncology History   No history exists.    CANCER STAGING: Cancer Staging No matching staging information was found for the patient.  INTERVAL HISTORY:  [name], a 55 y.o. 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   Patient is here for a follow-up by himself.  He complains of severe weight gain, swelling of his lower extremities and his abdomen since his last visit when he was taken off of his Lasix because of some kidney impairment issues.  He is uncomfortable because of his abdominal distention.  He complains of no energy and loss of appetite.  He is very short of breath with exertion.  Ongoing diarrhea which comes and bouts.  He has a couple weeks which are normal followed by a couple weeks of diarrhea.  He has also been feeling very gassy for the past 3 to 4 weeks. Rest of the pertinent 10 point ROS reviewed and negative.   REVIEW OF SYSTEMS:  Review of Systems  Constitutional: Positive for appetite change (25%) and fatigue (No energy).  Respiratory: Positive for shortness of breath (w/ exertion).   Gastrointestinal: Positive for abdominal pain, constipation (alternating w/ diarrhea w/ occasional blood) and diarrhea (explosive).  Musculoskeletal: Negative for back pain.  Skin: Negative for itching and rash.  Neurological: Negative for dizziness.       Tremors  Psychiatric/Behavioral: Positive for sleep disturbance. The patient is nervous/anxious.        Labile mood  All other systems reviewed and are negative.   PAST  MEDICAL/SURGICAL HISTORY:  Past Medical History:  Diagnosis Date  . Cirrhosis ([place])   . Hepatitis C    Ab + 07/28/19. RNA 436,000  . HTN (hypertension)    Past Surgical History:  Procedure Laterality Date  . debridement  Right    per pt, debridement of rt hand    SOCIAL HISTORY:  Social History   Socioeconomic History  . Marital status: Divorced    Spouse name: Not on file  . Number of children: 4  . Years of education: Not on file  . Highest education level: Not on file  Occupational History  . Occupation: unemployed  Tobacco Use  . Smoking status: Never Smoker  . Smokeless tobacco: Current User    Types: Snuff  . Tobacco comment: started using snuff since 1986  Vaping Use  . Vaping Use: Never used  Substance and Sexual Activity  . Alcohol use: Not Currently    Comment: occasional  . Drug use: Never  . Sexual activity: Not Currently  Other Topics Concern  . Not on file  Social History Narrative  . Not on file   Social Determinants of Health   Financial Resource Strain: Not on file  Food Insecurity: Not on file  Transportation Needs: Not on file  Physical Activity: Not on file  Stress: Not on file  Social Connections: Not on file  Intimate Partner Violence: Not on file    FAMILY HISTORY:  Family History  Problem Relation Age of Onset  . Cirrhosis Sister        per patient,  due to drug use.   . Alzheimer's disease Mother   . Cancer Mother   . Diabetes Mother   . Congestive Heart Failure Father   . Diabetes Father   . Alzheimer's disease Paternal Grandmother   . Osteoporosis Paternal Grandmother   . Diabetes Daughter   . Colon cancer Neg Hx     CURRENT MEDICATIONS:  Current Outpatient Medications  Medication Sig Dispense Refill  . clotrimazole (LOTRIMIN) 1 % cream Apply 1 application topically 2 (two) times daily. 30 g 3  . diphenoxylate-atropine (LOMOTIL) 2.5-0.025 MG tablet Take 2 tablets after first watery stool, then 1 tablet after each watery  stool.  No more than 8 tablets daily. (Patient taking differently: Take 2 tablets after first watery stool, then 1 tablet after each watery stool.  No more than 8 tablets daily.) 60 tablet 3  . Ensure (ENSURE) Take 237 mLs by mouth every evening.    . Hepatitis A-Hep B Recomb Vac (TWINRIX) 720-20 ELU-MCG/ML injection Inject into the muscle. Pt has last shot in July    . propranolol (INDERAL) 20 MG tablet Take 1 tablet by mouth daily.    . Sofosbuvir-Velpatasvir 400-100 MG TABS Take 1 tablet by mouth daily.    Marland Kitchen spironolactone (ALDACTONE) 100 MG tablet TAKE 1 TABLET BY MOUTH TWICE DAILY* TAKE 1 TABLET FIRST THING IN THE MORNING WITH TORSEMIDE AND 1 TABLET AT 4PM 60 tablet 2  . torsemide (DEMADEX) 20 MG tablet Take 2 tablets (40 mg total) by mouth daily. 60 tablet 3  . furosemide (LASIX) 40 MG tablet TAKE 1 TABLET BY MOUTH EVERY DAY (Patient not taking: Reported on 08/23/2020) 30 tablet 3   No current facility-administered medications for this visit.    ALLERGIES:  No Known Allergies  PHYSICAL EXAM:  Performance status (ECOG): 1 - Symptomatic but completely ambulatory  Vitals:   08/23/20 1043  BP: 117/72  Pulse: 87  Resp: 20  Temp: (!) 96.8 F (36 C)  SpO2: 99%   Wt Readings from Last 3 Encounters:  08/23/20 (!) 355 lb 12.8 oz (161.4 kg)  07/22/20 (!) 325 lb (147.4 kg)  06/24/20 (!) 333 lb (151 kg)   Physical Exam Vitals reviewed.  Constitutional:      Appearance: Normal appearance. He is obese.  Cardiovascular:     Rate and Rhythm: Normal rate and regular rhythm.     Pulses: Normal pulses.     Heart sounds: Normal heart sounds.  Pulmonary:     Effort: Pulmonary effort is normal.     Breath sounds: Normal breath sounds.  Abdominal:     General: There is distension (Concern for ascites).     Palpations: Abdomen is soft.     Tenderness: There is no abdominal tenderness.  Musculoskeletal:     Right lower leg: Edema (Bilateral lower extremity edema) present.     Left lower  leg: Edema present.  Skin:    Findings: Rash present.       Neurological:     General: No focal deficit present.     Mental Status: He is alert and oriented to person, place, and time.  Psychiatric:        Mood and Affect: Mood normal.        Behavior: Behavior normal.      LABORATORY DATA:  I have reviewed the labs as listed.  CBC Latest Ref Rng & Units 08/23/2020 08/07/2020 07/22/2020  WBC 4.0 - 10.5 K/uL 7.2 6.2 8.0  Hemoglobin 13.0 - 17.0 g/dL  14.0 14.3 14.8  Hematocrit 39.0 - 52.0 % 42.3 43.7 42.9  Platelets 150 - 400 K/uL 202 162 173   CMP Latest Ref Rng & Units 08/23/2020 08/07/2020 07/22/2020  Glucose 70 - 99 mg/dL 121(H) 119(H) 126(H)  BUN 6 - 20 mg/dL 12 19 44(H)  Creatinine 0.61 - 1.24 mg/dL 1.16 1.14 2.49(H)  Sodium 135 - 145 mmol/L 134(L) 137 133(L)  Potassium 3.5 - 5.1 mmol/L 3.8 3.9 4.9  Chloride 98 - 111 mmol/L 102 104 98  CO2 22 - 32 mmol/L 25 25 24   Calcium 8.9 - 10.3 mg/dL 8.5(L) 8.6(L) 9.3  Total Protein 6.5 - 8.1 g/dL 8.2(H) 7.5 8.6(H)  Total Bilirubin 0.3 - 1.2 mg/dL 1.6(H) 1.6(H) 2.2(H)  Alkaline Phos 38 - 126 U/L 147(H) 160(H) 150(H)  AST 15 - 41 U/L 33 37 31  ALT 0 - 44 U/L 22 29 27     DIAGNOSTIC IMAGING:  I have independently reviewed the scans and discussed with the patient.  No results found.   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  Nonobstructing left renal stone.  Nonobstructing right renal stone Labs from 2 weeks ago, relatively stable chromogranin, slight increase.  No major change in liver function. We will proceed with lanreotide today.  2. Cirrhosis and ascites: This is thought  to be secondary to hepatitis C. Patient was requesting paracentesis, try to arrange for it today but his insurance required authorization hence this had to be rescheduled.  3. Fluid retention: -After his last visit, he apparently stopped the diuretic and since then gained about 30 pounds. I have recommended he restart furosemide twice a day and come back in 2 weeks for follow-up on his labs. -This is again likely secondary to his cirrhosis.  Orders placed this encounter:  Orders Placed This Encounter  Procedures  . US Paracentesis  . US Paracentesis   Benay Pike MD

## 2020-08-28 LAB — CHROMOGRANIN A: Chromogranin A (ng/mL): 201.2 ng/mL — ABNORMAL HIGH (ref 0.0–101.8)

## 2020-08-29 ENCOUNTER — Other Ambulatory Visit (HOSPITAL_COMMUNITY): Payer: Medicaid - Out of State

## 2020-09-06 ENCOUNTER — Other Ambulatory Visit (HOSPITAL_COMMUNITY): Payer: Self-pay | Admitting: *Deleted

## 2020-09-07 NOTE — Progress Notes (Signed)
[place], [place] [address]   CLINIC:  Medical Oncology/Hematology  PCP:  Olin Hauser, [place] / [place] [address] [phone]   REASON FOR VISIT:  Follow-up for neuroendocrine tumor, on lanreotide  PRIOR THERAPY: none  NGS Results: not done  CURRENT THERAPY: Lanreotide monthly  BRIEF ONCOLOGIC HISTORY:  Oncology History   No history exists.    CANCER STAGING: Cancer Staging No matching staging information was found for the patient.  INTERVAL HISTORY:  [name], a 55 y.o. male, returns for routine follow-up of his neuroendocrine tumor, on lanreotide. Nicolas [name] was last seen on 08/23/2020.   Today he reports feeling okay. He reports inability to control BM for the past 2 weeks; the BM are soft but not watery. This occurred around 5 times daily for 5 days before becoming less frequent as of yesterday. He is not taking any laxatives or stool softeners. He is taking spironolactone once daily. He reports he is continuing to have panic attacks. He is no longer taking Lomotil because he reports it did not help. 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 He denies any flushing or abdominal pain. He denies any blood in his urine or pain from urination. He reports he is drinking 80 oz of water daily.   REVIEW OF SYSTEMS:  Review of Systems  Constitutional: Positive for appetite change (50%) and fatigue (depleted).  Respiratory: Positive for cough.   Gastrointestinal: Positive for abdominal pain (5/10) and diarrhea.  Genitourinary: Negative for dysuria and hematuria.   Musculoskeletal: Positive for back pain (5/10).  Psychiatric/Behavioral: The patient is nervous/anxious.   All other systems reviewed and are negative.   PAST MEDICAL/SURGICAL  HISTORY:  Past Medical History:  Diagnosis Date  . Cirrhosis ([place])   . Hepatitis C    Ab + 07/28/19. RNA 436,000  . HTN (hypertension)    Past Surgical History:  Procedure Laterality Date  . debridement  Right    per pt, debridement of rt hand    SOCIAL HISTORY:  Social History   Socioeconomic History  . Marital status: Divorced    Spouse name: Not on file  . Number of children: 4  . Years of education: Not on file  . Highest education level: Not on file  Occupational History  . Occupation: unemployed  Tobacco Use  . Smoking status: Never Smoker  . Smokeless tobacco: Current User    Types: Snuff  . Tobacco comment: started using snuff since 1986  Vaping Use  . Vaping Use: Never used  Substance and Sexual Activity  . Alcohol use: Not Currently    Comment: occasional  . Drug use: Never  . Sexual activity: Not Currently  Other Topics Concern  . Not on file  Social History Narrative  . Not on file   Social Determinants of Health   Financial Resource Strain: Not on file  Food Insecurity: Not on file  Transportation Needs: Not on file  Physical Activity: Not on file  Stress: Not on file  Social Connections: Not on file  Intimate Partner Violence: Not on file    FAMILY HISTORY:  Family History  Problem Relation Age of Onset  . Cirrhosis Sister        per patient, due to drug use.   . Alzheimer's disease Mother   .  Cancer Mother   . Diabetes Mother   . Congestive Heart Failure Father   . Diabetes Father   . Alzheimer's disease Paternal Grandmother   . Osteoporosis Paternal Grandmother   . Diabetes Daughter   . Colon cancer Neg Hx     CURRENT MEDICATIONS:  Current Outpatient Medications  Medication Sig Dispense Refill  . clotrimazole (LOTRIMIN) 1 % cream Apply 1 application topically 2 (two) times daily. 30 g 3  . diphenoxylate-atropine (LOMOTIL) 2.5-0.025 MG tablet Take 2 tablets after first watery stool, then 1 tablet after each watery stool.  No more  than 8 tablets daily. (Patient taking differently: Take 2 tablets after first watery stool, then 1 tablet after each watery stool.  No more than 8 tablets daily.) 60 tablet 3  . Ensure (ENSURE) Take 237 mLs by mouth every evening.    . furosemide (LASIX) 40 MG tablet TAKE 1 TABLET BY MOUTH EVERY DAY (Patient not taking: Reported on 08/23/2020) 30 tablet 3  . Hepatitis A-Hep B Recomb Vac (TWINRIX) 720-20 ELU-MCG/ML injection Inject into the muscle. Pt has last shot in July    . propranolol (INDERAL) 20 MG tablet Take 1 tablet by mouth daily.    . Sofosbuvir-Velpatasvir 400-100 MG TABS Take 1 tablet by mouth daily.    Marland Kitchen spironolactone (ALDACTONE) 100 MG tablet TAKE 1 TABLET BY MOUTH TWICE DAILY* TAKE 1 TABLET FIRST THING IN THE MORNING WITH TORSEMIDE AND 1 TABLET AT 4PM 60 tablet 2  . torsemide (DEMADEX) 20 MG tablet Take 2 tablets (40 mg total) by mouth daily. 60 tablet 3   No current facility-administered medications for this visit.    ALLERGIES:  No Known Allergies  PHYSICAL EXAM:  Performance status (ECOG): 1 - Symptomatic but completely ambulatory  There were no vitals filed for this visit. Wt Readings from Last 3 Encounters:  08/23/20 (!) 355 lb 12.8 oz (161.4 kg)  07/22/20 (!) 325 lb (147.4 kg)  06/24/20 (!) 333 lb (151 kg)   Physical Exam Vitals reviewed.  Constitutional:      Appearance: Normal appearance.  Cardiovascular:     Rate and Rhythm: Normal rate and regular rhythm.     Pulses: Normal pulses.     Heart sounds: Normal heart sounds.  Pulmonary:     Effort: Pulmonary effort is normal.     Breath sounds: Normal breath sounds.  Abdominal:     Palpations: Abdomen is soft. There is no hepatomegaly, splenomegaly or mass.     Tenderness: There is abdominal tenderness in the right upper quadrant and right lower quadrant.  Neurological:     General: No focal deficit present.     Mental Status: He is alert and oriented to person, place, and time.  Psychiatric:        Mood  and Affect: Mood normal.        Behavior: Behavior normal.      LABORATORY DATA:  I have reviewed the labs as listed.  CBC Latest Ref Rng & Units 08/23/2020 08/07/2020 07/22/2020  WBC 4.0 - 10.5 K/uL 7.2 6.2 8.0  Hemoglobin 13.0 - 17.0 g/dL 14.0 14.3 14.8  Hematocrit 39.0 - 52.0 % 42.3 43.7 42.9  Platelets 150 - 400 K/uL 202 162 173   CMP Latest Ref Rng & Units 08/23/2020 08/07/2020 07/22/2020  Glucose 70 - 99 mg/dL 121(H) 119(H) 126(H)  BUN 6 - 20 mg/dL 12 19 44(H)  Creatinine 0.61 - 1.24 mg/dL 1.16 1.14 2.49(H)  Sodium 135 - 145 mmol/L 134(L) 137  133(L)  Potassium 3.5 - 5.1 mmol/L 3.8 3.9 4.9  Chloride 98 - 111 mmol/L 102 104 98  CO2 22 - 32 mmol/L 25 25 24   Calcium 8.9 - 10.3 mg/dL 8.5(L) 8.6(L) 9.3  Total Protein 6.5 - 8.1 g/dL 8.2(H) 7.5 8.6(H)  Total Bilirubin 0.3 - 1.2 mg/dL 1.6(H) 1.6(H) 2.2(H)  Alkaline Phos 38 - 126 U/L 147(H) 160(H) 150(H)  AST 15 - 41 U/L 33 37 31  ALT 0 - 44 U/L 22 29 27     DIAGNOSTIC IMAGING:  I have independently reviewed the scans and discussed with the patient. No results found.   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 Possible mild nodular infiltration in the left upper quadrant mesentery and anterior omentum. Multiple small mesenteric root nodes. -PET scan on 09/05/2019 showed 4.2 cm right anterior mesenteric mass with punctate calcifications, SUV 7.2. Moderate ascites present. No definite retroperitoneal adenopathy. Liver is cirrhotic with spleen enlarged. Diffusely elevated hepatic activity which is nonspecific. -CT-guided biopsy of the mesenteric mass on 10/09/2019 [facility] consistent with well-differentiated neuroendocrine tumor (carcinoid tumor). Tumor cells are strongly positive for chromogranin and synaptophysin. Negative for CK7, CK20,  TTF-1, PSA, NKX3.1, S100, HMB-45, and CD56. -Gallium dotatate PET CT scan on 11/08/2019 at [facility] shows large solid mesenteric midline abdomen mass with increased activity measuring 6.5 cm, SUV of 70. In the uncinate process there is an area of increased activity visualized since seen on series 603 image 91 measuring 13.7 SUV. Increased activity throughout the liver and spleen limiting evaluation for metastatic disease. Hepatic activity is heterogeneous. -Lanreotide started on 11/13/2019.  2. Cirrhosis and ascites: -Thought to be secondary to hepatitis C. -Last paracentesis on 08/21/2019 with 4.8 L removed.  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 - He reports having diarrhea lasting 1 to 2 weeks, usually starts 1 week after receiving lanreotide injection.  Usually has diarrhea lasting up to 1 week.  About 4-5 episodes of soft stools but not watery.  Sometimes he has accidents.  Reviewed his labs which showed serum chromogranin level of 201 and stable. - Unclear if this is coming from neuroendocrine tumor.  He is not on any stool softeners or laxatives. - We will start him on Lomotil 2 tablets in the mornings during the week of diarrhea.  If not controlled, will consider switching lanreotide to every 3 weeks.-We will reevaluate him on 10/17/2020 when he comes injection.   2. Cirrhosis and ascites: -Completed sofosbuvir for treatment of hepatitis C.   3. Fluid retention:  - Started back on torsemide 20 mg daily and spironolactone 100 mg once daily on 09/09/2020. - He lost about 22 pounds. - Reviewed his labs today which showed creatinine stable at 1.05.  Potassium level was normal.  Continue diuretics.  4.  Anxiety/panic attacks: He reports having panic particularly when in public and unfamiliar situations. - We will start him on Xanax 0.5 mg twice daily as needed.  Orders placed  this encounter:  No orders of the defined types were placed in this encounter.    Derek Jack, MD [place] [phone]   I, Thana Ates, am acting as a scribe for Dr. Derek Jack.  I, Derek Jack MD, have reviewed the above documentation for accuracy and completeness, and I  agree with the above.

## 2020-09-10 ENCOUNTER — Inpatient Hospital Stay (HOSPITAL_BASED_OUTPATIENT_CLINIC_OR_DEPARTMENT_OTHER): Payer: Medicaid - Out of State | Admitting: Hematology

## 2020-09-10 ENCOUNTER — Inpatient Hospital Stay (HOSPITAL_COMMUNITY): Payer: Medicaid - Out of State

## 2020-09-10 ENCOUNTER — Other Ambulatory Visit: Payer: Self-pay

## 2020-09-10 VITALS — BP 111/75 | HR 79 | Temp 97.0°F | Resp 18 | Wt 333.4 lb

## 2020-09-10 DIAGNOSIS — C7A019 Malignant carcinoid tumor of the small intestine, unspecified portion: Secondary | ICD-10-CM | POA: Diagnosis not present

## 2020-09-10 DIAGNOSIS — C7A00 Malignant carcinoid tumor of unspecified site: Secondary | ICD-10-CM

## 2020-09-10 DIAGNOSIS — N189 Chronic kidney disease, unspecified: Secondary | ICD-10-CM | POA: Diagnosis not present

## 2020-09-10 LAB — CBC WITH DIFFERENTIAL/PLATELET
Abs Immature Granulocytes: 0.02 10*3/uL (ref 0.00–0.07)
Basophils Absolute: 0 10*3/uL (ref 0.0–0.1)
Basophils Relative: 1 %
Eosinophils Absolute: 0.2 10*3/uL (ref 0.0–0.5)
Eosinophils Relative: 2 %
HCT: 40.2 % (ref 39.0–52.0)
Hemoglobin: 13.2 g/dL (ref 13.0–17.0)
Immature Granulocytes: 0 %
Lymphocytes Relative: 30 %
Lymphs Abs: 2 10*3/uL (ref 0.7–4.0)
MCH: 33.6 pg (ref 26.0–34.0)
MCHC: 32.8 g/dL (ref 30.0–36.0)
MCV: 102.3 fL — ABNORMAL HIGH (ref 80.0–100.0)
Monocytes Absolute: 0.6 10*3/uL (ref 0.1–1.0)
Monocytes Relative: 9 %
Neutro Abs: 3.8 10*3/uL (ref 1.7–7.7)
Neutrophils Relative %: 58 %
Platelets: 186 10*3/uL (ref 150–400)
RBC: 3.93 MIL/uL — ABNORMAL LOW (ref 4.22–5.81)
RDW: 12.3 % (ref 11.5–15.5)
WBC: 6.7 10*3/uL (ref 4.0–10.5)
nRBC: 0 % (ref 0.0–0.2)

## 2020-09-10 LAB — COMPREHENSIVE METABOLIC PANEL
ALT: 18 U/L (ref 0–44)
AST: 27 U/L (ref 15–41)
Albumin: 2.8 g/dL — ABNORMAL LOW (ref 3.5–5.0)
Alkaline Phosphatase: 123 U/L (ref 38–126)
Anion gap: 6 (ref 5–15)
BUN: 21 mg/dL — ABNORMAL HIGH (ref 6–20)
CO2: 25 mmol/L (ref 22–32)
Calcium: 8.6 mg/dL — ABNORMAL LOW (ref 8.9–10.3)
Chloride: 104 mmol/L (ref 98–111)
Creatinine, Ser: 1.05 mg/dL (ref 0.61–1.24)
GFR, Estimated: >60 mL/min (ref >60)
Glucose, Bld: 109 mg/dL — ABNORMAL HIGH (ref 70–99)
Potassium: 3.9 mmol/L (ref 3.5–5.1)
Sodium: 135 mmol/L (ref 135–145)
Total Bilirubin: 1.5 mg/dL — ABNORMAL HIGH (ref 0.3–1.2)
Total Protein: 8.3 g/dL — ABNORMAL HIGH (ref 6.5–8.1)

## 2020-09-10 MED ORDER — ALPRAZOLAM 0.5 MG PO TABS: 0.5000 mg | ORAL_TABLET | Freq: Two times a day (BID) | ORAL | 1 refills | Status: DC | PRN

## 2020-09-10 MED ORDER — DIPHENOXYLATE-ATROPINE 2.5-0.025 MG PO TABS: ORAL_TABLET | ORAL | 3 refills | Status: AC

## 2020-09-10 NOTE — Patient Instructions (Addendum)
[facility] at [facility] Discharge Instructions  You were seen today by Dr. Delton Coombes. He went over your recent results. You will be prescribed Xanax to be taken as needed for anxiety. Start taking 2 tablets of Imodium every morning. Dr. Delton Coombes will see you back on 10/17/2020 for labs and follow up.   Thank you for choosing [facility] at [facility] to provide your oncology and hematology care.  To afford each patient quality time with our provider, please arrive at least 15 minutes before your scheduled appointment time.   If you have a lab appointment with the [facility] please come in thru the Main Entrance and check in at the main information desk  You need to re-schedule your appointment should you arrive 10 or more minutes late.  We strive to give you quality time with our providers, and arriving late affects you and other patients whose appointments are after yours.  Also, if you no show three or more times for appointments you may be dismissed from the clinic at the providers discretion.     Again, thank you for choosing [facility].  Our hope is that these requests will decrease the amount of time that you wait before being seen by our physicians.       _____________________________________________________________  Should you have questions after your visit to [facility], please contact our office at [phone] between the hours of 8:00 a.m. and 4:30 p.m.  Voicemails left after 4:00 p.m. will not be returned until the following business day.  For prescription refill requests, have your pharmacy contact our office and allow 72 hours.    Cancer Center Support Programs:   > Cancer Support Group  2nd Tuesday of the month 1pm-2pm, Journey Room

## 2020-09-12 LAB — CHROMOGRANIN A: Chromogranin A (ng/mL): 164.1 ng/mL — ABNORMAL HIGH (ref 0.0–101.8)

## 2020-09-17 ENCOUNTER — Other Ambulatory Visit (HOSPITAL_COMMUNITY): Payer: Self-pay | Admitting: Hematology

## 2020-09-17 ENCOUNTER — Ambulatory Visit (HOSPITAL_COMMUNITY): Payer: Medicaid - Out of State | Admitting: Hematology

## 2020-09-17 ENCOUNTER — Other Ambulatory Visit (HOSPITAL_COMMUNITY): Payer: Medicaid - Out of State

## 2020-09-17 DIAGNOSIS — R188 Other ascites: Secondary | ICD-10-CM

## 2020-09-17 DIAGNOSIS — K7460 Unspecified cirrhosis of liver: Secondary | ICD-10-CM

## 2020-09-19 ENCOUNTER — Encounter (HOSPITAL_COMMUNITY): Payer: Self-pay | Admitting: Hematology

## 2020-09-20 ENCOUNTER — Encounter (HOSPITAL_COMMUNITY): Payer: Self-pay

## 2020-09-20 ENCOUNTER — Inpatient Hospital Stay (HOSPITAL_COMMUNITY): Payer: Medicaid - Out of State | Attending: Nurse Practitioner

## 2020-09-20 ENCOUNTER — Other Ambulatory Visit: Payer: Self-pay

## 2020-09-20 VITALS — BP 141/94 | HR 77 | Temp 97.8°F | Resp 18

## 2020-09-20 DIAGNOSIS — C7A00 Malignant carcinoid tumor of unspecified site: Secondary | ICD-10-CM

## 2020-09-20 DIAGNOSIS — C7A019 Malignant carcinoid tumor of the small intestine, unspecified portion: Secondary | ICD-10-CM | POA: Insufficient documentation

## 2020-09-20 MED ORDER — LANREOTIDE ACETATE 120 MG/0.5ML ~~LOC~~ SOLN
120.0000 mg | Freq: Once | SUBCUTANEOUS | Status: AC
  Administered 2020-09-20: 120 mg via SUBCUTANEOUS

## 2020-09-20 NOTE — Progress Notes (Signed)
Patient tolerated injection with no complaints voiced. Site clean and dry with no bruising or swelling noted at site. See MAR for details. Band aid applied.  Patient stable during and after injection. VSS with discharge and left in satisfactory condition with no s/s of distress noted.  

## 2020-09-20 NOTE — Patient Instructions (Signed)
[facility]  Discharge Instructions: Thank you for choosing [facility] to provide your oncology and hematology care.  If you have a lab appointment with the [facility], please come in thru the Main Entrance and check in at the main information desk.  Wear comfortable clothing and clothing appropriate for easy access to any Portacath or PICC line.   We strive to give you quality time with your provider. You may need to reschedule your appointment if you arrive late (15 or more minutes).  Arriving late affects you and other patients whose appointments are after yours.  Also, if you miss three or more appointments without notifying the office, you may be dismissed from the clinic at the provider's discretion.      For prescription refill requests, have your pharmacy contact our office and allow 72 hours for refills to be completed.    Today you received the following chemotherapy and/or immunotherapy agents Lanreotide      To help prevent nausea and vomiting after your treatment, we encourage you to take your nausea medication as directed.  BELOW ARE SYMPTOMS THAT SHOULD BE REPORTED IMMEDIATELY: *FEVER GREATER THAN 100.4 F (38 C) OR HIGHER *CHILLS OR SWEATING *NAUSEA AND VOMITING THAT IS NOT CONTROLLED WITH YOUR NAUSEA MEDICATION *UNUSUAL SHORTNESS OF BREATH *UNUSUAL BRUISING OR BLEEDING *URINARY PROBLEMS (pain or burning when urinating, or frequent urination) *BOWEL PROBLEMS (unusual diarrhea, constipation, pain near the anus) TENDERNESS IN MOUTH AND THROAT WITH OR WITHOUT PRESENCE OF ULCERS (sore throat, sores in mouth, or a toothache) UNUSUAL RASH, SWELLING OR PAIN  UNUSUAL VAGINAL DISCHARGE OR ITCHING   Items with * indicate a potential emergency and should be followed up as soon as possible or go to the Emergency Department if any problems should occur.  Please show the CHEMOTHERAPY ALERT CARD or IMMUNOTHERAPY ALERT CARD at check-in to the Emergency  Department and triage nurse.  Should you have questions after your visit or need to cancel or reschedule your appointment, please contact [facility] [phone]  and follow the prompts.  Office hours are 8:00 a.m. to 4:30 p.m. Monday - Friday. Please note that voicemails left after 4:00 p.m. may not be returned until the following business day.  We are closed weekends and major holidays. You have access to a nurse at all times for urgent questions. Please call the main number to the clinic [phone] and follow the prompts.  For any non-urgent questions, you may also contact your provider using MyChart. We now offer e-Visits for anyone 98 and older to request care online for non-urgent symptoms. For details visit [email or web address].   Also download the MyChart app! Go to the app store, search "MyChart", open the app, select [facility], and log in with your MyChart username and password.  Due to Covid, a mask is required upon entering the hospital/clinic. If you do not have a mask, one will be given to you upon arrival. For doctor visits, patients may have 1 support person aged 75 or older with them. For treatment visits, patients cannot have anyone with them due to current Covid guidelines and our immunocompromised population.

## 2020-10-08 ENCOUNTER — Ambulatory Visit (HOSPITAL_COMMUNITY): Payer: Medicaid - Out of State

## 2020-10-17 ENCOUNTER — Inpatient Hospital Stay (HOSPITAL_COMMUNITY): Payer: Medicaid - Out of State | Attending: Hematology

## 2020-10-17 ENCOUNTER — Inpatient Hospital Stay (HOSPITAL_BASED_OUTPATIENT_CLINIC_OR_DEPARTMENT_OTHER): Payer: Medicaid - Out of State | Admitting: Nurse Practitioner

## 2020-10-17 ENCOUNTER — Encounter (HOSPITAL_COMMUNITY): Payer: Self-pay | Admitting: Nurse Practitioner

## 2020-10-17 ENCOUNTER — Other Ambulatory Visit: Payer: Self-pay

## 2020-10-17 ENCOUNTER — Inpatient Hospital Stay (HOSPITAL_COMMUNITY): Payer: Medicaid - Out of State

## 2020-10-17 VITALS — BP 110/66 | HR 63 | Temp 96.8°F | Resp 20 | Wt 325.4 lb

## 2020-10-17 DIAGNOSIS — K7031 Alcoholic cirrhosis of liver with ascites: Secondary | ICD-10-CM

## 2020-10-17 DIAGNOSIS — C801 Malignant (primary) neoplasm, unspecified: Secondary | ICD-10-CM | POA: Diagnosis not present

## 2020-10-17 DIAGNOSIS — N189 Chronic kidney disease, unspecified: Secondary | ICD-10-CM

## 2020-10-17 DIAGNOSIS — C7A00 Malignant carcinoid tumor of unspecified site: Secondary | ICD-10-CM

## 2020-10-17 DIAGNOSIS — F411 Generalized anxiety disorder: Secondary | ICD-10-CM

## 2020-10-17 DIAGNOSIS — E8770 Fluid overload, unspecified: Secondary | ICD-10-CM

## 2020-10-17 DIAGNOSIS — C7A8 Other malignant neuroendocrine tumors: Secondary | ICD-10-CM | POA: Diagnosis not present

## 2020-10-17 DIAGNOSIS — F419 Anxiety disorder, unspecified: Secondary | ICD-10-CM | POA: Diagnosis not present

## 2020-10-17 DIAGNOSIS — F410 Panic disorder [episodic paroxysmal anxiety] without agoraphobia: Secondary | ICD-10-CM

## 2020-10-17 LAB — CBC WITH DIFFERENTIAL/PLATELET
Abs Immature Granulocytes: 0.02 10*3/uL (ref 0.00–0.07)
Basophils Absolute: 0 10*3/uL (ref 0.0–0.1)
Basophils Relative: 1 %
Eosinophils Absolute: 0.2 10*3/uL (ref 0.0–0.5)
Eosinophils Relative: 4 %
HCT: 38.7 % — ABNORMAL LOW (ref 39.0–52.0)
Hemoglobin: 12.8 g/dL — ABNORMAL LOW (ref 13.0–17.0)
Immature Granulocytes: 0 %
Lymphocytes Relative: 34 %
Lymphs Abs: 2.2 10*3/uL (ref 0.7–4.0)
MCH: 33.8 pg (ref 26.0–34.0)
MCHC: 33.1 g/dL (ref 30.0–36.0)
MCV: 102.1 fL — ABNORMAL HIGH (ref 80.0–100.0)
Monocytes Absolute: 0.7 10*3/uL (ref 0.1–1.0)
Monocytes Relative: 11 %
Neutro Abs: 3.2 10*3/uL (ref 1.7–7.7)
Neutrophils Relative %: 50 %
Platelets: 148 10*3/uL — ABNORMAL LOW (ref 150–400)
RBC: 3.79 MIL/uL — ABNORMAL LOW (ref 4.22–5.81)
RDW: 13.6 % (ref 11.5–15.5)
WBC: 6.4 10*3/uL (ref 4.0–10.5)
nRBC: 0 % (ref 0.0–0.2)

## 2020-10-17 LAB — COMPREHENSIVE METABOLIC PANEL
ALT: 28 U/L (ref 0–44)
AST: 36 U/L (ref 15–41)
Albumin: 2.8 g/dL — ABNORMAL LOW (ref 3.5–5.0)
Alkaline Phosphatase: 134 U/L — ABNORMAL HIGH (ref 38–126)
Anion gap: 5 (ref 5–15)
BUN: 18 mg/dL (ref 6–20)
CO2: 24 mmol/L (ref 22–32)
Calcium: 8.3 mg/dL — ABNORMAL LOW (ref 8.9–10.3)
Chloride: 106 mmol/L (ref 98–111)
Creatinine, Ser: 1.13 mg/dL (ref 0.61–1.24)
GFR, Estimated: >60 mL/min (ref >60)
Glucose, Bld: 111 mg/dL — ABNORMAL HIGH (ref 70–99)
Potassium: 4 mmol/L (ref 3.5–5.1)
Sodium: 135 mmol/L (ref 135–145)
Total Bilirubin: 1.4 mg/dL — ABNORMAL HIGH (ref 0.3–1.2)
Total Protein: 7.9 g/dL (ref 6.5–8.1)

## 2020-10-17 MED ORDER — LANREOTIDE ACETATE 120 MG/0.5ML ~~LOC~~ SOLN
120.0000 mg | Freq: Once | SUBCUTANEOUS | Status: AC
  Administered 2020-10-17: 120 mg via SUBCUTANEOUS

## 2020-10-17 MED ORDER — SERTRALINE HCL 50 MG PO TABS: 50.0000 mg | ORAL_TABLET | Freq: Every day | ORAL | 0 refills | Status: DC

## 2020-10-17 NOTE — Progress Notes (Signed)
[place], [place] [address]   CLINIC:  Medical Oncology/Hematology  PCP:  Olin Hauser, [place] / [place] [address] [phone]  REASON FOR VISIT:  Follow-up for neuroendocrine tumor, on lanreotide  PRIOR THERAPY: none  NGS Results: not done  CURRENT THERAPY: Lanreotide monthly  BRIEF ONCOLOGIC HISTORY:  Oncology History   No history exists.    CANCER STAGING: Cancer Staging No matching staging information was found for the patient.  INTERVAL HISTORY:  [name], a 55 y.o. male, returns to clinic for routine follow up for neuroendocrine tumor, on lanretotide. Marland Kitchen He reports feeling well. Has diarrhea after injection. Takes imodium. Lomotil caused constipation. Complains of anxiety which is quite bothersome. Has xanax for acute panic episodes. No flushing, abdominal pain. No fevers or chills. Continues to have chronic tremor which is stable and unchanged. No blood in urine, or dysuria. No recent infections or hospitalizations. No specific complaints otherwise.   REVIEW OF SYSTEMS:  Review of Systems  Constitutional:  Negative for appetite change, fatigue and unexpected weight change.  HENT:   Negative for mouth sores, sore throat and trouble swallowing.   Respiratory:  Negative for chest tightness and shortness of breath.   Cardiovascular:  Negative for leg swelling.  Gastrointestinal:  Positive for diarrhea. Negative for abdominal distention, abdominal pain, constipation, nausea and vomiting.  Genitourinary:  Negative for bladder incontinence, dysuria, hematuria and pelvic pain.   Musculoskeletal:  Negative for flank pain and neck stiffness.  Skin:  Negative for itching, rash and wound.  Neurological:  Negative for dizziness, headaches, light-headedness and numbness.  Psychiatric/Behavioral:  Positive for depression. Negative for confusion and sleep disturbance. The patient is nervous/anxious.     PAST MEDICAL/SURGICAL HISTORY:  Past Medical History:  Diagnosis Date   Cirrhosis ([place])    Hepatitis C    Ab + 07/28/19. RNA 436,000   HTN (hypertension)    Past Surgical History:  Procedure Laterality Date   debridement  Right    per pt, debridement of rt hand    SOCIAL HISTORY:  Social History   Socioeconomic History   Marital status: Divorced    Spouse name: Not on file   Number of children: 4   Years of education: Not on file   Highest education level: Not on file  Occupational History   Occupation: unemployed  Tobacco Use   Smoking status: Never   Smokeless tobacco: Current    Types: Snuff   Tobacco comments:    started using snuff since 1986  Vaping Use   Vaping Use: Never used  Substance and Sexual Activity   Alcohol use: Not Currently    Comment: occasional   Drug use: Never   Sexual activity: Not Currently  Other Topics Concern   Not on file  Social History Narrative   Not on file   Social Determinants of Health   Financial Resource Strain: Not on file  Food Insecurity: Not on file  Transportation Needs: Not on file  Physical Activity: Not on file  Stress: Not on file  Social Connections: Not on file  Intimate Partner Violence: Not on file    FAMILY HISTORY:  Family History  Problem Relation Age of Onset   Cirrhosis Sister        per patient, due to drug use.    Alzheimer's disease Mother    Cancer Mother    Diabetes Mother    Congestive Heart Failure Father  Diabetes Father    Alzheimer's disease Paternal Grandmother    Osteoporosis Paternal Grandmother    Diabetes Daughter    Colon cancer Neg Hx     CURRENT MEDICATIONS:  Current Outpatient Medications  Medication Sig Dispense Refill   ALPRAZolam (XANAX) 0.5 MG tablet Take 1 tablet (0.5 mg total) by mouth 2 (two) times daily as needed for anxiety. 60 tablet 1   diphenoxylate-atropine (LOMOTIL) 2.5-0.025 MG tablet Take 2 tablets after first watery stool, then 1 tablet after each  watery stool.  No more than 8 tablets daily. 60 tablet 3   Ensure (ENSURE) Take 237 mLs by mouth every evening.     furosemide (LASIX) 40 MG tablet TAKE 1 TABLET BY MOUTH EVERY DAY 30 tablet 3   Hepatitis A-Hep B Recomb Vac (TWINRIX) 720-20 ELU-MCG/ML injection Inject into the muscle. Pt has last shot in July     spironolactone (ALDACTONE) 100 MG tablet TAKE 1 TABLET BY MOUTH EVERY MORNING WITH TORSEMIDE AND TAKE 1 TABLET BY MOUTH AT 4PM 60 tablet 2   torsemide (DEMADEX) 20 MG tablet Take 2 tablets (40 mg total) by mouth daily. 60 tablet 3   clotrimazole (LOTRIMIN) 1 % cream Apply 1 application topically 2 (two) times daily. (Patient not taking: No sig reported) 30 g 3   propranolol (INDERAL) 20 MG tablet Take 1 tablet by mouth daily. (Patient not taking: No sig reported)     No current facility-administered medications for this visit.    ALLERGIES:  No Known Allergies  PHYSICAL EXAM:  Performance status (ECOG): 1 - Symptomatic but completely ambulatory  Vitals:   10/17/20 1104  BP: 110/66  Pulse: 63  Resp: 20  Temp: (!) 96.8 F (36 C)  SpO2: 100%   Wt Readings from Last 3 Encounters:  10/17/20 (!) 325 lb 6.4 oz (147.6 kg)  09/10/20 (!) 333 lb 6.4 oz (151.2 kg)  08/23/20 (!) 355 lb 12.8 oz (161.4 kg)   Physical Exam Constitutional:      General: He is not in acute distress.    Appearance: He is well-developed.  HENT:     Head: Normocephalic and atraumatic.     Nose: Nose normal.     Mouth/Throat:     Pharynx: No oropharyngeal exudate.  Eyes:     General: No scleral icterus.    Conjunctiva/sclera: Conjunctivae normal.     Pupils: Pupils are equal, round, and reactive to light.  Cardiovascular:     Rate and Rhythm: Normal rate and regular rhythm.     Heart sounds: Normal heart sounds.  Pulmonary:     Effort: Pulmonary effort is normal.     Breath sounds: Normal breath sounds. No wheezing.  Abdominal:     General: Bowel sounds are normal. There is no distension.      Palpations: Abdomen is soft.     Tenderness: There is no abdominal tenderness.  Musculoskeletal:        General: Normal range of motion.     Cervical back: Normal range of motion and neck supple.  Skin:    General: Skin is warm and dry.  Neurological:     Mental Status: He is alert and oriented to person, place, and time.  Psychiatric:        Mood and Affect: Mood is anxious.        Behavior: Behavior normal.     LABORATORY DATA:  I have reviewed the labs as listed.  CBC Latest Ref Rng & Units 10/17/2020 09/10/2020  08/23/2020  WBC 4.0 - 10.5 K/uL 6.4 6.7 7.2  Hemoglobin 13.0 - 17.0 g/dL 12.8(L) 13.2 14.0  Hematocrit 39.0 - 52.0 % 38.7(L) 40.2 42.3  Platelets 150 - 400 K/uL 148(L) 186 202   CMP Latest Ref Rng & Units 10/17/2020 09/10/2020 08/23/2020  Glucose 70 - 99 mg/dL 111(H) 109(H) 121(H)  BUN 6 - 20 mg/dL 18 21(H) 12  Creatinine 0.61 - 1.24 mg/dL 1.13 1.05 1.16  Sodium 135 - 145 mmol/L 135 135 134(L)  Potassium 3.5 - 5.1 mmol/L 4.0 3.9 3.8  Chloride 98 - 111 mmol/L 106 104 102  CO2 22 - 32 mmol/L 24 25 25   Calcium 8.9 - 10.3 mg/dL 8.3(L) 8.6(L) 8.5(L)  Total Protein 6.5 - 8.1 g/dL 7.9 8.3(H) 8.2(H)  Total Bilirubin 0.3 - 1.2 mg/dL 1.4(H) 1.5(H) 1.6(H)  Alkaline Phos 38 - 126 U/L 134(H) 123 147(H)  AST 15 - 41 U/L 36 27 33  ALT 0 - 44 U/L 28 18 22     DIAGNOSTIC IMAGING:  I have independently reviewed the scans and discussed with the patient. No results found.   ASSESSMENT:  1.   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  Possible mild nodular infiltration in the left upper quadrant mesentery and anterior omentum.  Multiple small mesenteric root nodes. -PET scan on 09/05/2019 showed 4.2 cm right anterior mesenteric mass with punctate calcifications, SUV 7.2.  Moderate ascites present.  No definite retroperitoneal  adenopathy.  Liver is cirrhotic with spleen enlarged.  Diffusely elevated hepatic activity which is nonspecific. -CT-guided biopsy of the mesenteric mass on 10/09/2019 at [facility] consistent with well-differentiated neuroendocrine tumor (carcinoid tumor).  Tumor cells are strongly positive for chromogranin and synaptophysin.  Negative for CK7, CK20, TTF-1, PSA, NKX3.1, S100, HMB-45, and CD56. -Gallium dotatate PET CT scan on 11/08/2019 at [facility] shows large solid mesenteric midline abdomen mass with increased activity measuring 6.5 cm, SUV of 70.  In the uncinate process there is an area of increased activity visualized since seen on series 603 image 91 measuring 13.7 SUV.  Increased activity throughout the liver and spleen limiting evaluation for metastatic disease.  Hepatic activity is heterogeneous. -Lanreotide started on 11/13/2019.   2.  Cirrhosis and ascites: -Thought to be secondary to hepatitis C. -Last paracentesis on 08/21/2019 with 4.8 L removed.  PLAN:  1.  Well differentiated neuroendocrine tumor of the mesenteric mass: - Toelrating lanretoride every 4 weeks well with minimal side effects except diarrhea.  - Chromagranin stable at 206.2 but increased from previous.  - Unclear if diarrhea related to neuroendocrine tumor vs lanreotide vs other etiologies - Reduce lomotil to 1 tablet if diarrhea occurs. Ok to add imodium.  - if uncontrolled or uptrending chromagranin, consider switching lanretotide to every 3 weeks.    2.  Cirrhosis and ascites: -Completed sofosbuvir for treatment of hepatitis C.   3.  Fluid retention:  - Started back on torsemide 20 mg daily and spironolactone 100 mg once daily on 09/09/2020. - He lost about 22 pounds. - Reviewed his labs today which showed creatinine stable at 1.05.  Potassium level was normal.  Continue diuretics.  4.  Anxiety/panic attacks: - significant anxiety & panic attacks - Start sertraline 50 mg daily. Can  uptitrate once tolerance evaluated - continue xanax 0.5 mg twice daily as needed for acute panic.   Rtc in 4  weeks for labs, MD, lanretoride  Orders placed this encounter:  No orders of the defined types were placed in this encounter.  Beckey Rutter, DNP, AGNP-C

## 2020-10-17 NOTE — Progress Notes (Signed)
Pt here for lanreotide every 28 days.  Last administration 09/20/20. Vital signs WNL.  Nicolas [name] presents today for injection per MD orders. Lanreotide 120 mg administered SQ in right upper outer quadrant. Administration without incident. Patient tolerated well.  Stable during and after injection.  AVS reviewed.  Discharged in stable condition ambulatory.

## 2020-10-17 NOTE — Patient Instructions (Signed)
[employer]  Discharge Instructions: Thank you for choosing [facility] to provide your oncology and hematology care.  If you have a lab appointment with the [facility], please come in thru the Main Entrance and check in at the main information desk.  Wear comfortable clothing and clothing appropriate for easy access to any Portacath or PICC line.   We strive to give you quality time with your provider. You may need to reschedule your appointment if you arrive late (15 or more minutes).  Arriving late affects you and other patients whose appointments are after yours.  Also, if you miss three or more appointments without notifying the office, you may be dismissed from the clinic at the provider's discretion.      For prescription refill requests, have your pharmacy contact our office and allow 72 hours for refills to be completed.    Today you received the following today:  lanreotide    To help prevent nausea and vomiting after your treatment, we encourage you to take your nausea medication as directed.  BELOW ARE SYMPTOMS THAT SHOULD BE REPORTED IMMEDIATELY: *FEVER GREATER THAN 100.4 F (38 C) OR HIGHER *CHILLS OR SWEATING *NAUSEA AND VOMITING THAT IS NOT CONTROLLED WITH YOUR NAUSEA MEDICATION *UNUSUAL SHORTNESS OF BREATH *UNUSUAL BRUISING OR BLEEDING *URINARY PROBLEMS (pain or burning when urinating, or frequent urination) *BOWEL PROBLEMS (unusual diarrhea, constipation, pain near the anus) TENDERNESS IN MOUTH AND THROAT WITH OR WITHOUT PRESENCE OF ULCERS (sore throat, sores in mouth, or a toothache) UNUSUAL RASH, SWELLING OR PAIN  UNUSUAL VAGINAL DISCHARGE OR ITCHING   Items with * indicate a potential emergency and should be followed up as soon as possible or go to the Emergency Department if any problems should occur.  Please show the CHEMOTHERAPY ALERT CARD or IMMUNOTHERAPY ALERT CARD at check-in to the Emergency Department and triage nurse.  Should  you have questions after your visit or need to cancel or reschedule your appointment, please contact [facility] [phone]  and follow the prompts.  Office hours are 8:00 a.m. to 4:30 p.m. Monday - Friday. Please note that voicemails left after 4:00 p.m. may not be returned until the following business day.  We are closed weekends and major holidays. You have access to a nurse at all times for urgent questions. Please call the main number to the clinic [phone] and follow the prompts.  For any non-urgent questions, you may also contact your provider using MyChart. We now offer e-Visits for anyone 49 and older to request care online for non-urgent symptoms. For details visit [email or web address].   Also download the MyChart app! Go to the app store, search "MyChart", open the app, select [facility], and log in with your MyChart username and password.  Due to Covid, a mask is required upon entering the hospital/clinic. If you do not have a mask, one will be given to you upon arrival. For doctor visits, patients may have 1 support person aged 61 or older with them. For treatment visits, patients cannot have anyone with them due to current Covid guidelines and our immunocompromised population.   Lanreotide injection What is this medication? LANREOTIDE (lan REE oh tide) is used to reduce blood levels of growth hormone in patients with a condition called acromegaly. It also works to slow or stop tumor growth in patients with neuroendocrine tumors and treat carcinoidsyndrome. This medicine may be used for other purposes; ask your health care provider orpharmacist if you have questions.  COMMON BRAND NAME(S): Somatuline Dep[place] What should I tell my care team before I take this medication? They need to know if you have any of these conditions: diabetes gallbladder disease heart disease kidney disease liver disease thyroid disease an unusual or allergic reaction to lanreotide, other  medicines, foods, dyes, or preservatives pregnant or trying to get pregnant breast-feeding How should I use this medication? This medicine is for injection under the skin. It is given by a health careprofessional in a hospital or clinic setting. Contact your pediatrician or health care professional regarding the use of thismedicine in children. Special care may be needed. Overdosage: If you think you have taken too much of this medicine contact apoison control center or emergency room at once. NOTE: This medicine is only for you. Do not share this medicine with others. What if I miss a dose? It is important not to miss your dose. Call your doctor or health careprofessional if you are unable to keep an appointment. What may interact with this medication? This medicine may interact with the following medications: bromocriptine cyclosporine certain medicines for blood pressure, heart disease, irregular heart beat certain medicines for diabetes quinidine terfenadine This list may not describe all possible interactions. Give your health care provider a list of all the medicines, herbs, non-prescription drugs, or dietary supplements you use. Also tell them if you smoke, drink alcohol, or use illegaldrugs. Some items may interact with your medicine. What should I watch for while using this medication? Tell your doctor or healthcare professional if your symptoms do not start toget better or if they get worse. Visit your doctor or health care professional for regular checks on your progress. Your condition will be monitored carefully while you are receivingthis medicine. This medicine may increase blood sugar. Ask your healthcare provider if changesin diet or medicines are needed if you have diabetes. You may need blood work done while you are taking this medicine. Women should inform their doctor if they wish to become pregnant or think they might be pregnant. There is a potential for serious side  effects to an unborn child. Talk to your health care professional or pharmacist for more information. Do not breast-feed an infant while taking this medicine or for 69months after stopping it. This medicine has caused ovarian failure in some women. This medicine may interfere with the ability to have a child. Talk with your doctor or healthcare professional if you are concerned about your fertility. What side effects may I notice from receiving this medication? Side effects that you should report to your doctor or health care professionalas soon as possible: allergic reactions like skin rash, itching or hives, swelling of the face, lips, or tongue increased blood pressure severe stomach pain signs and symptoms of hgh blood sugar such as being more thirsty or hungry or having to urinate more than normal. You may also feel very tired or have blurry vision. signs and symptoms of low blood sugar such as feeling anxious; confusion; dizziness; increased hunger; unusually weak or tired; sweating; shakiness; cold; irritable; headache; blurred vision; fast heartbeat; loss of consciousness unusually slow heartbeat Side effects that usually do not require medical attention (report to yourdoctor or health care professional if they continue or are bothersome): constipation diarrhea dizziness headache muscle pain muscle spasms nausea pain, redness, or irritation at site where injected This list may not describe all possible side effects. Call your doctor for medical advice about side effects. You may report side effects to FDA at1-800-FDA-1088. Where should  I keep my medication? This drug is given in a hospital or clinic and will not be stored at home. NOTE: This sheet is a summary. It may not cover all possible information. If you have questions about this medicine, talk to your doctor, pharmacist, orhealth care provider.  2022 Elsevier/Gold Standard (2018-01-06 09:13:08)

## 2020-10-18 ENCOUNTER — Ambulatory Visit (HOSPITAL_COMMUNITY): Payer: Medicaid - Out of State

## 2020-10-18 LAB — CHROMOGRANIN A: Chromogranin A (ng/mL): 206.2 ng/mL — ABNORMAL HIGH (ref 0.0–101.8)

## 2020-11-13 NOTE — Progress Notes (Signed)
Nicolas [name], [place] [id number]   CLINIC:  Medical Oncology/Hematology  PCP:  Olin Hauser, [place] [address] / [place] [id number] [phone]   REASON FOR VISIT:  Follow-up for neuroendocrine tumor, on lanreotide  PRIOR THERAPY: none  NGS Results: not done  CURRENT THERAPY: Lanreotide monthly  BRIEF ONCOLOGIC HISTORY:  Oncology History   No history exists.    CANCER STAGING: Cancer Staging No matching staging information was found for the patient.  INTERVAL HISTORY:  [name], a 55 y.o. male, returns for routine follow-up of his neuroendocrine tumor. Babatunde was last seen on 09/10/20.   Today he reports feeling well. He reports diarrhea for 2 weeks following lanreotide injection which he has been treating prn with 2 lomotil tablets daily which he reports then causes constipation. He reports small palpable knots on his right knee, right inner corner of eyelid, and the bridge of his nose which itch. He denies wheezing, but reports abdominal pain upon palpation.   REVIEW OF SYSTEMS:  Review of Systems  Constitutional:  Positive for appetite change (75%) and fatigue (50%).  Respiratory:  Positive for shortness of breath (w/ exertion). Negative for wheezing.   Gastrointestinal:  Positive for abdominal pain, constipation and diarrhea.  Musculoskeletal:  Positive for back pain (6/10 lower).  Skin:  Positive for itching.  Neurological:  Positive for numbness (tingling in feet).  Psychiatric/Behavioral:  The patient is nervous/anxious.   All other systems reviewed and are negative.  PAST MEDICAL/SURGICAL HISTORY:  Past Medical History:  Diagnosis Date   Cirrhosis ([place])    Hepatitis C    Ab + 07/28/19. RNA 436,000   HTN (hypertension)    Past Surgical History:  Procedure Laterality Date   debridement  Right    per pt, debridement of rt hand    SOCIAL HISTORY:  Social History   Socioeconomic History    Marital status: Divorced    Spouse name: Not on file   Number of children: 4   Years of education: Not on file   Highest education level: Not on file  Occupational History   Occupation: unemployed  Tobacco Use   Smoking status: Never   Smokeless tobacco: Current    Types: Snuff   Tobacco comments:    started using snuff since 1986  Vaping Use   Vaping Use: Never used  Substance and Sexual Activity   Alcohol use: Not Currently    Comment: occasional   Drug use: Never   Sexual activity: Not Currently  Other Topics Concern   Not on file  Social History Narrative   Not on file   Social Determinants of Health   Financial Resource Strain: Not on file  Food Insecurity: Not on file  Transportation Needs: Not on file  Physical Activity: Not on file  Stress: Not on file  Social Connections: Not on file  Intimate Partner Violence: Not on file    FAMILY HISTORY:  Family History  Problem Relation Age of Onset   Cirrhosis Sister        per patient, due to drug use.    Alzheimer's disease Mother    Cancer Mother    Diabetes Mother    Congestive Heart Failure Father    Diabetes Father    Alzheimer's disease Paternal Grandmother    Osteoporosis Paternal Grandmother    Diabetes Daughter    Colon cancer Neg Hx     CURRENT MEDICATIONS:  Current Outpatient Medications  Medication Sig Dispense Refill   ALPRAZolam (XANAX) 0.5 MG tablet Take 1 tablet (0.5 mg total) by mouth 2 (two) times daily as needed for anxiety. 60 tablet 1   clotrimazole (LOTRIMIN) 1 % cream Apply 1 application topically 2 (two) times daily. (Patient not taking: No sig reported) 30 g 3   diphenoxylate-atropine (LOMOTIL) 2.5-0.025 MG tablet Take 2 tablets after first watery stool, then 1 tablet after each watery stool.  No more than 8 tablets daily. 60 tablet 3   Ensure (ENSURE) Take 237 mLs by mouth every evening.     furosemide (LASIX) 40 MG tablet TAKE 1 TABLET BY MOUTH EVERY DAY 30 tablet 3   Hepatitis  A-Hep B Recomb Vac (TWINRIX) 720-20 ELU-MCG/ML injection Inject into the muscle. Pt has last shot in July     propranolol (INDERAL) 20 MG tablet Take 1 tablet by mouth daily. (Patient not taking: No sig reported)     sertraline (ZOLOFT) 50 MG tablet Take 1 tablet (50 mg total) by mouth daily. 60 tablet 0   spironolactone (ALDACTONE) 100 MG tablet TAKE 1 TABLET BY MOUTH EVERY MORNING WITH TORSEMIDE AND TAKE 1 TABLET BY MOUTH AT 4PM 60 tablet 2   torsemide (DEMADEX) 20 MG tablet Take 2 tablets (40 mg total) by mouth daily. 60 tablet 3   No current facility-administered medications for this visit.    ALLERGIES:  No Known Allergies  PHYSICAL EXAM:  Performance status (ECOG): 1 - Symptomatic but completely ambulatory  There were no vitals filed for this visit. Wt Readings from Last 3 Encounters:  10/17/20 (!) 325 lb 6.4 oz (147.6 kg)  09/10/20 (!) 333 lb 6.4 oz (151.2 kg)  08/23/20 (!) 355 lb 12.8 oz (161.4 kg)   Physical Exam Vitals reviewed.  Constitutional:      Appearance: Normal appearance.  Cardiovascular:     Rate and Rhythm: Normal rate and regular rhythm.     Pulses: Normal pulses.     Heart sounds: Normal heart sounds.  Pulmonary:     Effort: Pulmonary effort is normal.     Breath sounds: Normal breath sounds.  Neurological:     General: No focal deficit present.     Mental Status: He is alert and oriented to person, place, and time.  Psychiatric:        Mood and Affect: Mood normal.        Behavior: Behavior normal.     LABORATORY DATA:  I have reviewed the labs as listed.  CBC Latest Ref Rng & Units 11/14/2020 10/17/2020 09/10/2020  WBC 4.0 - 10.5 K/uL 5.9 6.4 6.7  Hemoglobin 13.0 - 17.0 g/dL 13.8 12.8(L) 13.2  Hematocrit 39.0 - 52.0 % 40.9 38.7(L) 40.2  Platelets 150 - 400 K/uL 153 148(L) 186   CMP Latest Ref Rng & Units 11/14/2020 10/17/2020 09/10/2020  Glucose 70 - 99 mg/dL 108(H) 111(H) 109(H)  BUN 6 - 20 mg/dL 18 18 21(H)  Creatinine 0.61 - 1.24 mg/dL 1.07 1.13  1.05  Sodium 135 - 145 mmol/L 134(L) 135 135  Potassium 3.5 - 5.1 mmol/L 3.9 4.0 3.9  Chloride 98 - 111 mmol/L 106 106 104  CO2 22 - 32 mmol/L '23 24 25  '$ Calcium 8.9 - 10.3 mg/dL 8.1(L) 8.3(L) 8.6(L)  Total Protein 6.5 - 8.1 g/dL 8.1 7.9 8.3(H)  Total Bilirubin 0.3 - 1.2 mg/dL 1.7(H) 1.4(H) 1.5(H)  Alkaline Phos 38 - 126 U/L 142(H) 134(H) 123  AST 15 - 41 U/L 31 36 27  ALT 0 -  44 U/L '20 28 18    '$ DIAGNOSTIC IMAGING:  I have independently reviewed the scans and discussed with the patient. No results found.   ASSESSMENT:  1.   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  Possible mild nodular infiltration in the left upper quadrant mesentery and anterior omentum.  Multiple small mesenteric root nodes. -PET scan on 09/05/2019 showed 4.2 cm right anterior mesenteric mass with punctate calcifications, SUV 7.2.  Moderate ascites present.  No definite retroperitoneal adenopathy.  Liver is cirrhotic with spleen enlarged.  Diffusely elevated hepatic activity which is nonspecific. -CT-guided biopsy of the mesenteric mass on 10/09/2019 at [facility] consistent with well-differentiated neuroendocrine tumor (carcinoid tumor).  Tumor cells are strongly positive for chromogranin and synaptophysin.  Negative for CK7, CK20, TTF-1, PSA, NKX3.1, S100, HMB-45, and CD56. -Gallium dotatate PET CT scan on 11/08/2019 at [facility] shows large solid mesenteric midline abdomen mass with increased activity measuring 6.5 cm, SUV of 70.  In the uncinate process there is an area of increased activity visualized since seen on series 603 image 91 measuring 13.7 SUV.  Increased activity throughout the liver and spleen limiting evaluation for metastatic disease.  Hepatic activity is heterogeneous. -Lanreotide started on 11/13/2019.   2.  Cirrhosis  and ascites: -Thought to be secondary to hepatitis C. -Last paracentesis on 08/21/2019 with 4.8 L removed.   PLAN:  1.  Well differentiated neuroendocrine tumor of the mesenteric mass: - He reports having diarrhea starting 1 to 2 days after lanreotide and injection and lasting about 1 to 2 weeks. - At last visit we will start him on Lomotil 2 tablets at the first onset of diarrhea followed by 1 tablet as needed.  He was taking it every day which resulted in constipation. - I have informed him to cut back to 1 tablet at the first onset of diarrhea and take it only as needed. - I have reviewed his labs today which showed elevated alkaline phosphatase 142 and elevated total bilirubin 1.7 from liver disease.  Rest of the liver panel was normal.  CBC was grossly normal.  Last chromogranin was 206.  Level from today is pending. - He does not have any signs or symptoms of carcinoid syndrome.  He will continue monthly lanreotide.  RTC 2 months for follow-up.   2.  Cirrhosis and ascites: - Completed sofosbuvir for treatment of hepatitis C.    3.  Fluid retention: - Continue torsemide 20 mg and spironolactone 100 mg daily. - His weight is staying stable.   4.  Anxiety/panic attacks: -He reports having panic attacks particularly when in public and unfamiliar situations. - At last visit have started him on Xanax 0.5 mg twice daily.  He is taking 1 tablet at bedtime.  He is not taking during daytime as he cannot drive. - I have told him to increase it to 2 tablets at bedtime if needed.   Orders placed this encounter:  No orders of the defined types were placed in this encounter.    Derek Jack, MD [place] [phone]   I, Thana Ates, am acting as a scribe for Dr. Derek Jack.  I, Derek Jack MD, have reviewed the above documentation for accuracy and completeness, and I agree with the above.

## 2020-11-14 ENCOUNTER — Inpatient Hospital Stay (HOSPITAL_COMMUNITY): Payer: Medicaid - Out of State

## 2020-11-14 ENCOUNTER — Inpatient Hospital Stay (HOSPITAL_COMMUNITY): Payer: Medicaid - Out of State | Attending: Hematology

## 2020-11-14 ENCOUNTER — Inpatient Hospital Stay (HOSPITAL_BASED_OUTPATIENT_CLINIC_OR_DEPARTMENT_OTHER): Payer: Medicaid - Out of State | Admitting: Hematology

## 2020-11-14 ENCOUNTER — Other Ambulatory Visit: Payer: Self-pay

## 2020-11-14 VITALS — BP 124/83 | HR 66 | Temp 96.8°F | Resp 20 | Wt 327.4 lb

## 2020-11-14 DIAGNOSIS — K7460 Unspecified cirrhosis of liver: Secondary | ICD-10-CM | POA: Diagnosis not present

## 2020-11-14 DIAGNOSIS — C7A00 Malignant carcinoid tumor of unspecified site: Secondary | ICD-10-CM

## 2020-11-14 DIAGNOSIS — R188 Other ascites: Secondary | ICD-10-CM | POA: Diagnosis not present

## 2020-11-14 DIAGNOSIS — R197 Diarrhea, unspecified: Secondary | ICD-10-CM | POA: Insufficient documentation

## 2020-11-14 DIAGNOSIS — C7A092 Malignant carcinoid tumor of the stomach: Secondary | ICD-10-CM | POA: Insufficient documentation

## 2020-11-14 LAB — CBC WITH DIFFERENTIAL/PLATELET
Abs Immature Granulocytes: 0.01 10*3/uL (ref 0.00–0.07)
Basophils Absolute: 0 10*3/uL (ref 0.0–0.1)
Basophils Relative: 1 %
Eosinophils Absolute: 0.2 10*3/uL (ref 0.0–0.5)
Eosinophils Relative: 4 %
HCT: 40.9 % (ref 39.0–52.0)
Hemoglobin: 13.8 g/dL (ref 13.0–17.0)
Immature Granulocytes: 0 %
Lymphocytes Relative: 31 %
Lymphs Abs: 1.8 10*3/uL (ref 0.7–4.0)
MCH: 33.9 pg (ref 26.0–34.0)
MCHC: 33.7 g/dL (ref 30.0–36.0)
MCV: 100.5 fL — ABNORMAL HIGH (ref 80.0–100.0)
Monocytes Absolute: 0.5 10*3/uL (ref 0.1–1.0)
Monocytes Relative: 8 %
Neutro Abs: 3.3 10*3/uL (ref 1.7–7.7)
Neutrophils Relative %: 56 %
Platelets: 153 10*3/uL (ref 150–400)
RBC: 4.07 MIL/uL — ABNORMAL LOW (ref 4.22–5.81)
RDW: 13.3 % (ref 11.5–15.5)
WBC: 5.9 10*3/uL (ref 4.0–10.5)
nRBC: 0 % (ref 0.0–0.2)

## 2020-11-14 LAB — COMPREHENSIVE METABOLIC PANEL
ALT: 20 U/L (ref 0–44)
AST: 31 U/L (ref 15–41)
Albumin: 2.9 g/dL — ABNORMAL LOW (ref 3.5–5.0)
Alkaline Phosphatase: 142 U/L — ABNORMAL HIGH (ref 38–126)
Anion gap: 5 (ref 5–15)
BUN: 18 mg/dL (ref 6–20)
CO2: 23 mmol/L (ref 22–32)
Calcium: 8.1 mg/dL — ABNORMAL LOW (ref 8.9–10.3)
Chloride: 106 mmol/L (ref 98–111)
Creatinine, Ser: 1.07 mg/dL (ref 0.61–1.24)
GFR, Estimated: >60 mL/min (ref >60)
Glucose, Bld: 108 mg/dL — ABNORMAL HIGH (ref 70–99)
Potassium: 3.9 mmol/L (ref 3.5–5.1)
Sodium: 134 mmol/L — ABNORMAL LOW (ref 135–145)
Total Bilirubin: 1.7 mg/dL — ABNORMAL HIGH (ref 0.3–1.2)
Total Protein: 8.1 g/dL (ref 6.5–8.1)

## 2020-11-14 MED ORDER — LANREOTIDE ACETATE 120 MG/0.5ML ~~LOC~~ SOLN
120.0000 mg | Freq: Once | SUBCUTANEOUS | Status: AC
  Administered 2020-11-14: 120 mg via SUBCUTANEOUS

## 2020-11-14 MED ORDER — SPIRONOLACTONE 100 MG PO TABS: ORAL_TABLET | ORAL | 2 refills | Status: DC

## 2020-11-14 MED ORDER — TORSEMIDE 20 MG PO TABS: 40.0000 mg | ORAL_TABLET | Freq: Every day | ORAL | 3 refills | Status: DC

## 2020-11-14 NOTE — Patient Instructions (Addendum)
[place] at [facility] Discharge Instructions  You were seen today by Dr. Delton Coombes. He went over your recent results, and you received your treatment. Dr. Delton Coombes will see you back in 2 months for labs and follow up.   Thank you for choosing [place] at [facility] to provide your oncology and hematology care.  To afford each patient quality time with our provider, please arrive at least 15 minutes before your scheduled appointment time.   If you have a lab appointment with the [facility] please come in thru the Main Entrance and check in at the main information desk  You need to re-schedule your appointment should you arrive 10 or more minutes late.  We strive to give you quality time with our providers, and arriving late affects you and other patients whose appointments are after yours.  Also, if you no show three or more times for appointments you may be dismissed from the clinic at the providers discretion.     Again, thank you for choosing [facility].  Our hope is that these requests will decrease the amount of time that you wait before being seen by our physicians.       _____________________________________________________________  Should you have questions after your visit to [facility], please contact our office at [phone] between the hours of 8:00 a.m. and 4:30 p.m.  Voicemails left after 4:00 p.m. will not be returned until the following business day.  For prescription refill requests, have your pharmacy contact our office and allow 72 hours.    Cancer Center Support Programs:   > Cancer Support Group  2nd Tuesday of the month 1pm-2pm, Journey Room

## 2020-11-14 NOTE — Patient Instructions (Signed)
Nicolas [name]  Discharge Instructions: Thank you for choosing [facility] to provide your oncology and hematology care.  If you have a lab appointment with the [facility], please come in thru the Main Entrance and check in at the main information desk.  Wear comfortable clothing and clothing appropriate for easy access to any Portacath or PICC line.   We strive to give you quality time with your provider. You may need to reschedule your appointment if you arrive late (15 or more minutes).  Arriving late affects you and other patients whose appointments are after yours.  Also, if you miss three or more appointments without notifying the office, you may be dismissed from the clinic at the provider's discretion.      For prescription refill requests, have your pharmacy contact our office and allow 72 hours for refills to be completed.    Today you received the following chemotherapy and/or immunotherapy agents Lanreotide.   To help prevent nausea and vomiting after your treatment, we encourage you to take your nausea medication as directed.  BELOW ARE SYMPTOMS THAT SHOULD BE REPORTED IMMEDIATELY: *FEVER GREATER THAN 100.4 F (38 C) OR HIGHER *CHILLS OR SWEATING *NAUSEA AND VOMITING THAT IS NOT CONTROLLED WITH YOUR NAUSEA MEDICATION *UNUSUAL SHORTNESS OF BREATH *UNUSUAL BRUISING OR BLEEDING *URINARY PROBLEMS (pain or burning when urinating, or frequent urination) *BOWEL PROBLEMS (unusual diarrhea, constipation, pain near the anus) TENDERNESS IN MOUTH AND THROAT WITH OR WITHOUT PRESENCE OF ULCERS (sore throat, sores in mouth, or a toothache) UNUSUAL RASH, SWELLING OR PAIN  UNUSUAL VAGINAL DISCHARGE OR ITCHING   Items with * indicate a potential emergency and should be followed up as soon as possible or go to the Emergency Department if any problems should occur.  Please show the CHEMOTHERAPY ALERT CARD or IMMUNOTHERAPY ALERT CARD at check-in to the Emergency  Department and triage nurse.  Should you have questions after your visit or need to cancel or reschedule your appointment, please contact [facility] [phone]  and follow the prompts.  Office hours are 8:00 a.m. to 4:30 p.m. Monday - Friday. Please note that voicemails left after 4:00 p.m. may not be returned until the following business day.  We are closed weekends and major holidays. You have access to a nurse at all times for urgent questions. Please call the main number to the clinic [phone] and follow the prompts.  For any non-urgent questions, you may also contact your provider using MyChart. We now offer e-Visits for anyone 28 and older to request care online for non-urgent symptoms. For details visit [email or web address].   Also download the MyChart app! Go to the app store, search "MyChart", open the app, select [place], and log in with your MyChart username and password.  Due to Covid, a mask is required upon entering the hospital/clinic. If you do not have a mask, one will be given to you upon arrival. For doctor visits, patients may have 1 support person aged 55 or older with them. For treatment visits, patients cannot have anyone with them due to current Covid guidelines and our immunocompromised population.

## 2020-11-14 NOTE — Progress Notes (Signed)
Patient tolerated Lanreotide injection with no complaints voiced.  Patient stable during and after injection.  See MAR for details.  Site clean and dry with no bruising or swelling noted at site.  Band aid applied.  Vss with discharge and left in satisfactory condition with no s/s of distress noted.   

## 2020-11-15 LAB — CHROMOGRANIN A: Chromogranin A (ng/mL): 188.6 ng/mL — ABNORMAL HIGH (ref 0.0–101.8)

## 2020-11-18 ENCOUNTER — Encounter (HOSPITAL_COMMUNITY): Payer: Self-pay | Admitting: Hematology

## 2020-12-12 ENCOUNTER — Inpatient Hospital Stay (HOSPITAL_COMMUNITY): Payer: Medicaid - Out of State | Attending: Nurse Practitioner

## 2020-12-12 ENCOUNTER — Encounter (HOSPITAL_COMMUNITY): Payer: Self-pay

## 2020-12-12 VITALS — BP 135/82 | HR 77 | Temp 97.6°F | Resp 20

## 2020-12-12 DIAGNOSIS — C7A00 Malignant carcinoid tumor of unspecified site: Secondary | ICD-10-CM

## 2020-12-12 DIAGNOSIS — R188 Other ascites: Secondary | ICD-10-CM | POA: Diagnosis not present

## 2020-12-12 DIAGNOSIS — K7460 Unspecified cirrhosis of liver: Secondary | ICD-10-CM | POA: Diagnosis not present

## 2020-12-12 DIAGNOSIS — C7A019 Malignant carcinoid tumor of the small intestine, unspecified portion: Secondary | ICD-10-CM | POA: Diagnosis not present

## 2020-12-12 MED ORDER — LANREOTIDE ACETATE 120 MG/0.5ML ~~LOC~~ SOLN
120.0000 mg | Freq: Once | SUBCUTANEOUS | Status: AC
  Administered 2020-12-12: 120 mg via SUBCUTANEOUS

## 2020-12-12 NOTE — Patient Instructions (Signed)
[place]  Discharge Instructions: Thank you for choosing [facility] to provide your oncology and hematology care.  If you have a lab appointment with the [facility], please come in thru the Main Entrance and check in at the main information desk.  Wear comfortable clothing and clothing appropriate for easy access to any Portacath or PICC line.   We strive to give you quality time with your provider. You may need to reschedule your appointment if you arrive late (15 or more minutes).  Arriving late affects you and other patients whose appointments are after yours.  Also, if you miss three or more appointments without notifying the office, you may be dismissed from the clinic at the provider's discretion.      For prescription refill requests, have your pharmacy contact our office and allow 72 hours for refills to be completed.    Today you received the following chemotherapy and/or immunotherapy agents lanreotide       To help prevent nausea and vomiting after your treatment, we encourage you to take your nausea medication as directed.  BELOW ARE SYMPTOMS THAT SHOULD BE REPORTED IMMEDIATELY: *FEVER GREATER THAN 100.4 F (38 C) OR HIGHER *CHILLS OR SWEATING *NAUSEA AND VOMITING THAT IS NOT CONTROLLED WITH YOUR NAUSEA MEDICATION *UNUSUAL SHORTNESS OF BREATH *UNUSUAL BRUISING OR BLEEDING *URINARY PROBLEMS (pain or burning when urinating, or frequent urination) *BOWEL PROBLEMS (unusual diarrhea, constipation, pain near the anus) TENDERNESS IN MOUTH AND THROAT WITH OR WITHOUT PRESENCE OF ULCERS (sore throat, sores in mouth, or a toothache) UNUSUAL RASH, SWELLING OR PAIN  UNUSUAL VAGINAL DISCHARGE OR ITCHING   Items with * indicate a potential emergency and should be followed up as soon as possible or go to the Emergency Department if any problems should occur.  Please show the CHEMOTHERAPY ALERT CARD or IMMUNOTHERAPY ALERT CARD at check-in to the Emergency  Department and triage nurse.  Should you have questions after your visit or need to cancel or reschedule your appointment, please contact [facility] [phone]  and follow the prompts.  Office hours are 8:00 a.m. to 4:30 p.m. Monday - Friday. Please note that voicemails left after 4:00 p.m. may not be returned until the following business day.  We are closed weekends and major holidays. You have access to a nurse at all times for urgent questions. Please call the main number to the clinic [phone] and follow the prompts.  For any non-urgent questions, you may also contact your provider using MyChart. We now offer e-Visits for anyone 90 and older to request care online for non-urgent symptoms. For details visit [email or web address].   Also download the MyChart app! Go to the app store, search "MyChart", open the app, select [place], and log in with your MyChart username and password.  Due to Covid, a mask is required upon entering the hospital/clinic. If you do not have a mask, one will be given to you upon arrival. For doctor visits, patients may have 1 support person aged 61 or older with them. For treatment visits, patients cannot have anyone with them due to current Covid guidelines and our immunocompromised population.

## 2020-12-12 NOTE — Progress Notes (Signed)
Patient tolerated Lanreotide injection with no complaints voiced.  Patient stable during and after injection.  See MAR for details.  Site clean and dry with no bruising or swelling noted at site.  Band aid applied.  Vss with discharge and left in satisfactory condition with no s/s of distress noted.   

## 2021-01-08 NOTE — Progress Notes (Signed)
[place], [place] [address]   CLINIC:  Medical Oncology/Hematology  PCP:  Olin Hauser, [place] / [place] [address] [phone]   REASON FOR VISIT:  Follow-up for neuroendocrine tumor, on lanreotid  PRIOR THERAPY: none  NGS Results: not done  CURRENT THERAPY: Lanreotide monthly  BRIEF ONCOLOGIC HISTORY:  Oncology History   No history exists.    CANCER STAGING: Cancer Staging No matching staging information was found for the patient.  INTERVAL HISTORY:  [name], a 55 y.o. male, returns for routine follow-up of his neuroendocrine tumor. Nicolas [name] was last seen on 11/14/2020.   Today he reports feeling well. He reports constipation following taking lomotil for diarrhea. He stopped taking lomotil 2 weeks ago, but he remains constipated. He denies abdominal pain, flushing, and wheezing, and reports chronic back pain and SOB upon exertion.   REVIEW OF SYSTEMS:  Review of Systems  Constitutional:  Positive for fatigue (40%). Negative for appetite change (75%).  Respiratory:  Positive for shortness of breath. Negative for wheezing.   Gastrointestinal:  Positive for constipation and diarrhea. Negative for abdominal pain.  Genitourinary:  Positive for frequency.   Musculoskeletal:  Positive for back pain.  Psychiatric/Behavioral:  Positive for sleep disturbance. The patient is nervous/anxious.   All other systems reviewed and are negative.  PAST MEDICAL/SURGICAL HISTORY:  Past Medical History:  Diagnosis Date   Cirrhosis ([place])    Hepatitis C    Ab + 07/28/19. RNA 436,000   HTN (hypertension)    Past Surgical History:  Procedure Laterality Date   debridement  Right    per pt, debridement of rt hand    SOCIAL HISTORY:  Social History   Socioeconomic History   Marital status: Divorced    Spouse name: Not on file   Number of children: 4   Years of education: Not on file   Highest education  level: Not on file  Occupational History   Occupation: unemployed  Tobacco Use   Smoking status: Never   Smokeless tobacco: Current    Types: Snuff   Tobacco comments:    started using snuff since 1986  Vaping Use   Vaping Use: Never used  Substance and Sexual Activity   Alcohol use: Not Currently    Comment: occasional   Drug use: Never   Sexual activity: Not Currently  Other Topics Concern   Not on file  Social History Narrative   Not on file   Social Determinants of Health   Financial Resource Strain: Not on file  Food Insecurity: Not on file  Transportation Needs: Not on file  Physical Activity: Not on file  Stress: Not on file  Social Connections: Not on file  Intimate Partner Violence: Not on file    FAMILY HISTORY:  Family History  Problem Relation Age of Onset   Cirrhosis Sister        per patient, due to drug use.    Alzheimer's disease Mother    Cancer Mother    Diabetes Mother    Congestive Heart Failure Father    Diabetes Father    Alzheimer's disease Paternal Grandmother    Osteoporosis Paternal Grandmother    Diabetes Daughter    Colon cancer Neg Hx     CURRENT MEDICATIONS:  Current Outpatient Medications  Medication Sig Dispense Refill   ALPRAZolam (XANAX) 0.5 MG tablet 1 tab(s)     clotrimazole (LOTRIMIN) 1 % cream Apply 1 application topically 2 (  two) times daily. 30 g 3   diphenoxylate-atropine (LOMOTIL) 2.5-0.025 MG tablet Take 2 tablets after first watery stool, then 1 tablet after each watery stool.  No more than 8 tablets daily. 60 tablet 3   diphenoxylate-atropine (LOMOTIL) 2.5-0.025 MG tablet 2 tab(s)     Ensure (ENSURE) Take 237 mLs by mouth every evening.     furosemide (LASIX) 40 MG tablet TAKE 1 TABLET BY MOUTH EVERY DAY 30 tablet 3   Hepatitis A-Hep B Recomb Vac (TWINRIX) 720-20 ELU-MCG/ML injection Inject into the muscle. Pt has last shot in July     propranolol (INDERAL) 20 MG tablet Take 1 tablet by mouth daily.     sertraline  (ZOLOFT) 50 MG tablet Take 1 tablet (50 mg total) by mouth daily. 60 tablet 0   sildenafil (VIAGRA) 50 MG tablet Take by mouth.     spironolactone (ALDACTONE) 100 MG tablet 1 tab(s)     spironolactone (ALDACTONE) 100 MG tablet Take one tablet in AM 60 tablet 2   torsemide (DEMADEX) 20 MG tablet Take 2 tablets (40 mg total) by mouth daily. 60 tablet 3   ALPRAZolam (XANAX) 0.5 MG tablet Take 1 tablet (0.5 mg total) by mouth 2 (two) times daily as needed for anxiety. (Patient not taking: Reported on 01/09/2021) 60 tablet 1   No current facility-administered medications for this visit.    ALLERGIES:  No Known Allergies  PHYSICAL EXAM:  Performance status (ECOG): 1 - Symptomatic but completely ambulatory  Vitals:   01/09/21 1425  BP: 112/73  Pulse: 66  Resp: 18  Temp: (!) 97.3 F (36.3 C)  SpO2: 100%   Wt Readings from Last 3 Encounters:  01/09/21 (!) 320 lb 6.4 oz (145.3 kg)  11/14/20 (!) 327 lb 6.4 oz (148.5 kg)  10/17/20 (!) 325 lb 6.4 oz (147.6 kg)   Physical Exam Vitals reviewed.  Constitutional:      Appearance: Normal appearance.  Cardiovascular:     Rate and Rhythm: Normal rate and regular rhythm.     Pulses: Normal pulses.     Heart sounds: Normal heart sounds.  Pulmonary:     Effort: Pulmonary effort is normal.     Breath sounds: Normal breath sounds. No wheezing.  Neurological:     General: No focal deficit present.     Mental Status: He is alert and oriented to person, place, and time.  Psychiatric:        Mood and Affect: Mood normal.        Behavior: Behavior normal.     LABORATORY DATA:  I have reviewed the labs as listed.  CBC Latest Ref Rng & Units 01/09/2021 11/14/2020 10/17/2020  WBC 4.0 - 10.5 K/uL 5.7 5.9 6.4  Hemoglobin 13.0 - 17.0 g/dL 14.4 13.8 12.8(L)  Hematocrit 39.0 - 52.0 % 42.5 40.9 38.7(L)  Platelets 150 - 400 K/uL 180 153 148(L)   CMP Latest Ref Rng & Units 01/09/2021 11/14/2020 10/17/2020  Glucose 70 - 99 mg/dL 110(H) 108(H) 111(H)  BUN 6 -  20 mg/dL _0 Creatinine 0.61 - 1.24 mg/dL 1.14 1.07 1.13  Sodium 135 - 145 mmol/L 136 134(L) 135  Potassium 3.5 - 5.1 mmol/L 4.2 3.9 4.0  Chloride 98 - 111 mmol/L 103 106 106  CO2 22 - 32 mmol/L _1 Calcium 8.9 - 10.3 mg/dL 8.7(L) 8.1(L) 8.3(L)  Total Protein 6.5 - 8.1 g/dL 8.4(H) 8.1 7.9  Total Bilirubin 0.3 - 1.2 mg/dL 1.3(H) 1.7(H) 1.4(H)  Alkaline  Phos 38 - 126 U/L 151(H) 142(H) 134(H)  AST 15 - 41 U/L 33 31 36  ALT 0 - 44 U/L _0 DIAGNOSTIC IMAGING:  I have independently reviewed the scans and discussed with the patient. No results found.   ASSESSMENT:  1.   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  Possible mild nodular infiltration in the left upper quadrant mesentery and anterior omentum.  Multiple small mesenteric root nodes. -PET scan on 09/05/2019 showed 4.2 cm right anterior mesenteric mass with punctate calcifications, SUV 7.2.  Moderate ascites present.  No definite retroperitoneal adenopathy.  Liver is cirrhotic with spleen enlarged.  Diffusely elevated hepatic activity which is nonspecific. -CT-guided biopsy of the mesenteric mass on 10/09/2019 at [facility] consistent with well-differentiated neuroendocrine tumor (carcinoid tumor).  Tumor cells are strongly positive for chromogranin and synaptophysin.  Negative for CK7, CK20, TTF-1, PSA, NKX3.1, S100, HMB-45, and CD56. -Gallium dotatate PET CT scan on 11/08/2019 at [facility] shows large solid mesenteric midline abdomen mass with increased activity measuring 6.5 cm, SUV of 70.  In the uncinate process there is an area of increased activity visualized since seen on series 603 image 91 measuring 13.7 SUV.  Increased activity throughout the liver and spleen limiting evaluation for metastatic disease.  Hepatic activity is  heterogeneous. -Lanreotide started on 11/13/2019.   2.  Cirrhosis and ascites: -Thought to be secondary to hepatitis C. -Last paracentesis on 08/21/2019 with 4.8 L removed.   PLAN:  1.  Well differentiated neuroendocrine tumor of the mesenteric mass: - He reports having diarrhea for the first 2 weeks after the lanreotide injection.  He is taking Lomotil 1 pill every 2 to 3 days which is helping.  He usually has constipation in the last 2 weeks. - Reviewed labs from today which shows normal CBC.  Total bilirubin is 1.3 and alk phos elevated at 151.  Rest of LFTs are normal.  Serum chromogranin level was 188 on 11/14/2020.  This is stable since February of this year when the last CT scan was done. - Proceed with lanreotide today.  We will arrange for CT of the abdomen and pelvis with contrast prior to next visit in 4 weeks.  We will follow-up on chromogranin level from today.   2.  Cirrhosis and ascites: - He has completed sofosbuvir for treatment of hepatitis C.  Follows up with hematology and [facility].    3.  Fluid retention: - Continue torsemide 20 mg and spironolactone 100 mg daily.   4.  Anxiety/panic attacks: - Continue Xanax 0.5 mg twice daily as needed.   Orders placed this encounter:  No orders of the defined types were placed in this encounter.    Derek Jack, MD [place] [phone]   I, Thana Ates, am acting as a scribe for Dr. Derek Jack.  I, Derek Jack MD, have reviewed the above documentation for accuracy and completeness, and I agree with the above.

## 2021-01-09 ENCOUNTER — Inpatient Hospital Stay (HOSPITAL_COMMUNITY): Payer: Medicaid - Out of State

## 2021-01-09 ENCOUNTER — Inpatient Hospital Stay (HOSPITAL_BASED_OUTPATIENT_CLINIC_OR_DEPARTMENT_OTHER): Payer: Medicaid - Out of State | Admitting: Hematology

## 2021-01-09 ENCOUNTER — Other Ambulatory Visit: Payer: Self-pay

## 2021-01-09 VITALS — BP 112/73 | HR 66 | Temp 97.3°F | Resp 18 | Wt 320.4 lb

## 2021-01-09 VITALS — BP 113/74 | HR 69 | Temp 97.8°F | Resp 18

## 2021-01-09 DIAGNOSIS — C7A019 Malignant carcinoid tumor of the small intestine, unspecified portion: Secondary | ICD-10-CM | POA: Diagnosis not present

## 2021-01-09 DIAGNOSIS — C7A00 Malignant carcinoid tumor of unspecified site: Secondary | ICD-10-CM

## 2021-01-09 DIAGNOSIS — R188 Other ascites: Secondary | ICD-10-CM

## 2021-01-09 DIAGNOSIS — K7460 Unspecified cirrhosis of liver: Secondary | ICD-10-CM

## 2021-01-09 LAB — COMPREHENSIVE METABOLIC PANEL
ALT: 25 U/L (ref 0–44)
AST: 33 U/L (ref 15–41)
Albumin: 3.1 g/dL — ABNORMAL LOW (ref 3.5–5.0)
Alkaline Phosphatase: 151 U/L — ABNORMAL HIGH (ref 38–126)
Anion gap: 6 (ref 5–15)
BUN: 16 mg/dL (ref 6–20)
CO2: 27 mmol/L (ref 22–32)
Calcium: 8.7 mg/dL — ABNORMAL LOW (ref 8.9–10.3)
Chloride: 103 mmol/L (ref 98–111)
Creatinine, Ser: 1.14 mg/dL (ref 0.61–1.24)
GFR, Estimated: >60 mL/min (ref >60)
Glucose, Bld: 110 mg/dL — ABNORMAL HIGH (ref 70–99)
Potassium: 4.2 mmol/L (ref 3.5–5.1)
Sodium: 136 mmol/L (ref 135–145)
Total Bilirubin: 1.3 mg/dL — ABNORMAL HIGH (ref 0.3–1.2)
Total Protein: 8.4 g/dL — ABNORMAL HIGH (ref 6.5–8.1)

## 2021-01-09 LAB — CBC WITH DIFFERENTIAL/PLATELET
Abs Immature Granulocytes: 0.02 10*3/uL (ref 0.00–0.07)
Basophils Absolute: 0 10*3/uL (ref 0.0–0.1)
Basophils Relative: 1 %
Eosinophils Absolute: 0.3 10*3/uL (ref 0.0–0.5)
Eosinophils Relative: 5 %
HCT: 42.5 % (ref 39.0–52.0)
Hemoglobin: 14.4 g/dL (ref 13.0–17.0)
Immature Granulocytes: 0 %
Lymphocytes Relative: 33 %
Lymphs Abs: 1.9 10*3/uL (ref 0.7–4.0)
MCH: 34.2 pg — ABNORMAL HIGH (ref 26.0–34.0)
MCHC: 33.9 g/dL (ref 30.0–36.0)
MCV: 101 fL — ABNORMAL HIGH (ref 80.0–100.0)
Monocytes Absolute: 0.5 10*3/uL (ref 0.1–1.0)
Monocytes Relative: 8 %
Neutro Abs: 3 10*3/uL (ref 1.7–7.7)
Neutrophils Relative %: 53 %
Platelets: 180 10*3/uL (ref 150–400)
RBC: 4.21 MIL/uL — ABNORMAL LOW (ref 4.22–5.81)
RDW: 13.1 % (ref 11.5–15.5)
WBC: 5.7 10*3/uL (ref 4.0–10.5)
nRBC: 0 % (ref 0.0–0.2)

## 2021-01-09 LAB — MAGNESIUM: Magnesium: 1.8 mg/dL (ref 1.7–2.4)

## 2021-01-09 MED ORDER — LANREOTIDE ACETATE 120 MG/0.5ML ~~LOC~~ SOLN
120.0000 mg | Freq: Once | SUBCUTANEOUS | Status: AC
  Administered 2021-01-09: 120 mg via SUBCUTANEOUS

## 2021-01-09 NOTE — Patient Instructions (Signed)
[facility]  Discharge Instructions: Thank you for choosing [facility] to provide your oncology and hematology care.  If you have a lab appointment with the [facility], please come in thru the Main Entrance and check in at the main information desk.  Wear comfortable clothing and clothing appropriate for easy access to any Portacath or PICC line.   We strive to give you quality time with your provider. You may need to reschedule your appointment if you arrive late (15 or more minutes).  Arriving late affects you and other patients whose appointments are after yours.  Also, if you miss three or more appointments without notifying the office, you may be dismissed from the clinic at the provider's discretion.      For prescription refill requests, have your pharmacy contact our office and allow 72 hours for refills to be completed.    Today you received the following chemotherapy and/or immunotherapy agents Lanreotide      To help prevent nausea and vomiting after your treatment, we encourage you to take your nausea medication as directed.  BELOW ARE SYMPTOMS THAT SHOULD BE REPORTED IMMEDIATELY: *FEVER GREATER THAN 100.4 F (38 C) OR HIGHER *CHILLS OR SWEATING *NAUSEA AND VOMITING THAT IS NOT CONTROLLED WITH YOUR NAUSEA MEDICATION *UNUSUAL SHORTNESS OF BREATH *UNUSUAL BRUISING OR BLEEDING *URINARY PROBLEMS (pain or burning when urinating, or frequent urination) *BOWEL PROBLEMS (unusual diarrhea, constipation, pain near the anus) TENDERNESS IN MOUTH AND THROAT WITH OR WITHOUT PRESENCE OF ULCERS (sore throat, sores in mouth, or a toothache) UNUSUAL RASH, SWELLING OR PAIN  UNUSUAL VAGINAL DISCHARGE OR ITCHING   Items with * indicate a potential emergency and should be followed up as soon as possible or go to the Emergency Department if any problems should occur.  Please show the CHEMOTHERAPY ALERT CARD or IMMUNOTHERAPY ALERT CARD at check-in to the Emergency  Department and triage nurse.  Should you have questions after your visit or need to cancel or reschedule your appointment, please contact [facility] [phone]  and follow the prompts.  Office hours are 8:00 a.m. to 4:30 p.m. Monday - Friday. Please note that voicemails left after 4:00 p.m. may not be returned until the following business day.  We are closed weekends and major holidays. You have access to a nurse at all times for urgent questions. Please call the main number to the clinic [phone] and follow the prompts.  For any non-urgent questions, you may also contact your provider using MyChart. We now offer e-Visits for anyone 98 and older to request care online for non-urgent symptoms. For details visit [email or web address].   Also download the MyChart app! Go to the app store, search "MyChart", open the app, select [facility], and log in with your MyChart username and password.  Due to Covid, a mask is required upon entering the hospital/clinic. If you do not have a mask, one will be given to you upon arrival. For doctor visits, patients may have 1 support person aged 75 or older with them. For treatment visits, patients cannot have anyone with them due to current Covid guidelines and our immunocompromised population.

## 2021-01-09 NOTE — Patient Instructions (Addendum)
[place] at [facility] Discharge Instructions  You were seen today by Dr. Delton Coombes. He went over your recent results. You will be scheduled for a CT scan of your abdomen and pelvis prior to your next appointment. Dr. Delton Coombes will see you back in 1 month for labs and follow up.   Thank you for choosing [place] at [facility] to provide your oncology and hematology care.  To afford each patient quality time with our provider, please arrive at least 15 minutes before your scheduled appointment time.   If you have a lab appointment with the [facility] please come in thru the Main Entrance and check in at the main information desk  You need to re-schedule your appointment should you arrive 10 or more minutes late.  We strive to give you quality time with our providers, and arriving late affects you and other patients whose appointments are after yours.  Also, if you no show three or more times for appointments you may be dismissed from the clinic at the providers discretion.     Again, thank you for choosing [facility].  Our hope is that these requests will decrease the amount of time that you wait before being seen by our physicians.       _____________________________________________________________  Should you have questions after your visit to [facility], please contact our office at [phone] between the hours of 8:00 a.m. and 4:30 p.m.  Voicemails left after 4:00 p.m. will not be returned until the following business day.  For prescription refill requests, have your pharmacy contact our office and allow 72 hours.    Cancer Center Support Programs:   > Cancer Support Group  2nd Tuesday of the month 1pm-2pm, Journey Room

## 2021-01-09 NOTE — Progress Notes (Signed)
[place] presents today for Lanreotide injection per the provider's orders.  Stable during administration without incident; injection site WNL; see MAR for injection details.  Patient tolerated procedure well and without incident.  No questions or complaints noted at this time.  

## 2021-01-11 LAB — CHROMOGRANIN A: Chromogranin A (ng/mL): 193.9 ng/mL — ABNORMAL HIGH (ref 0.0–101.8)

## 2021-01-30 ENCOUNTER — Encounter (HOSPITAL_COMMUNITY): Payer: Self-pay | Admitting: *Deleted

## 2021-01-30 ENCOUNTER — Other Ambulatory Visit (HOSPITAL_COMMUNITY): Payer: Self-pay | Admitting: *Deleted

## 2021-01-30 DIAGNOSIS — C7A00 Malignant carcinoid tumor of unspecified site: Secondary | ICD-10-CM

## 2021-01-30 DIAGNOSIS — K7460 Unspecified cirrhosis of liver: Secondary | ICD-10-CM

## 2021-01-30 DIAGNOSIS — R188 Other ascites: Secondary | ICD-10-CM

## 2021-01-30 NOTE — Progress Notes (Signed)
Order sent to [facility] for CT AP, as insurance requires all scans be done at this facility.  Fax [phone]

## 2021-02-03 ENCOUNTER — Telehealth (HOSPITAL_COMMUNITY): Payer: Self-pay | Admitting: Hematology

## 2021-02-03 NOTE — Telephone Encounter (Signed)
Ct A/P APPROVED BY INS [id number] 10/20-12/19/22  PT MUST GO TO A PAR FAC  [facility] [place] [address]

## 2021-02-06 ENCOUNTER — Inpatient Hospital Stay (HOSPITAL_COMMUNITY): Payer: Medicaid - Out of State | Attending: Hematology

## 2021-02-06 ENCOUNTER — Ambulatory Visit (HOSPITAL_COMMUNITY): Payer: Medicaid - Out of State

## 2021-02-06 ENCOUNTER — Other Ambulatory Visit: Payer: Self-pay

## 2021-02-06 DIAGNOSIS — R188 Other ascites: Secondary | ICD-10-CM | POA: Insufficient documentation

## 2021-02-06 DIAGNOSIS — C7A00 Malignant carcinoid tumor of unspecified site: Secondary | ICD-10-CM

## 2021-02-06 DIAGNOSIS — C7A098 Malignant carcinoid tumors of other sites: Secondary | ICD-10-CM | POA: Insufficient documentation

## 2021-02-06 DIAGNOSIS — K7460 Unspecified cirrhosis of liver: Secondary | ICD-10-CM | POA: Insufficient documentation

## 2021-02-06 LAB — CBC WITH DIFFERENTIAL/PLATELET
Abs Immature Granulocytes: 0.02 10*3/uL (ref 0.00–0.07)
Basophils Absolute: 0 10*3/uL (ref 0.0–0.1)
Basophils Relative: 1 %
Eosinophils Absolute: 0.3 10*3/uL (ref 0.0–0.5)
Eosinophils Relative: 6 %
HCT: 40.2 % (ref 39.0–52.0)
Hemoglobin: 13.1 g/dL (ref 13.0–17.0)
Immature Granulocytes: 0 %
Lymphocytes Relative: 28 %
Lymphs Abs: 1.4 10*3/uL (ref 0.7–4.0)
MCH: 33.1 pg (ref 26.0–34.0)
MCHC: 32.6 g/dL (ref 30.0–36.0)
MCV: 101.5 fL — ABNORMAL HIGH (ref 80.0–100.0)
Monocytes Absolute: 0.4 10*3/uL (ref 0.1–1.0)
Monocytes Relative: 7 %
Neutro Abs: 2.8 10*3/uL (ref 1.7–7.7)
Neutrophils Relative %: 58 %
Platelets: 162 10*3/uL (ref 150–400)
RBC: 3.96 MIL/uL — ABNORMAL LOW (ref 4.22–5.81)
RDW: 13.1 % (ref 11.5–15.5)
WBC: 4.9 10*3/uL (ref 4.0–10.5)
nRBC: 0 % (ref 0.0–0.2)

## 2021-02-06 LAB — COMPREHENSIVE METABOLIC PANEL
ALT: 20 U/L (ref 0–44)
AST: 28 U/L (ref 15–41)
Albumin: 2.6 g/dL — ABNORMAL LOW (ref 3.5–5.0)
Alkaline Phosphatase: 146 U/L — ABNORMAL HIGH (ref 38–126)
Anion gap: 5 (ref 5–15)
BUN: 15 mg/dL (ref 6–20)
CO2: 27 mmol/L (ref 22–32)
Calcium: 8.2 mg/dL — ABNORMAL LOW (ref 8.9–10.3)
Chloride: 103 mmol/L (ref 98–111)
Creatinine, Ser: 1.06 mg/dL (ref 0.61–1.24)
GFR, Estimated: >60 mL/min (ref >60)
Glucose, Bld: 163 mg/dL — ABNORMAL HIGH (ref 70–99)
Potassium: 3.6 mmol/L (ref 3.5–5.1)
Sodium: 135 mmol/L (ref 135–145)
Total Bilirubin: 0.9 mg/dL (ref 0.3–1.2)
Total Protein: 7.6 g/dL (ref 6.5–8.1)

## 2021-02-06 LAB — MAGNESIUM: Magnesium: 1.9 mg/dL (ref 1.7–2.4)

## 2021-02-07 LAB — CHROMOGRANIN A: Chromogranin A (ng/mL): 155.2 ng/mL — ABNORMAL HIGH (ref 0.0–101.8)

## 2021-02-08 NOTE — Progress Notes (Signed)
[place], [place] [address]   CLINIC:  Medical Oncology/Hematology  PCP:  Olin Hauser, [place] / [place] [address] [phone]   REASON FOR VISIT:  Follow-up for neuroendocrine tumor, on lanreotid  PRIOR THERAPY: none  NGS Results: not done  CURRENT THERAPY: Lanreotide monthly  BRIEF ONCOLOGIC HISTORY:  Oncology History   No history exists.    CANCER STAGING: Cancer Staging No matching staging information was found for the patient.  INTERVAL HISTORY:  [name], a 55 y.o. male, returns for routine follow-up of his neuroendocrine tumor, on lanreotid. Nicolas [name] was last seen on 01/09/2021.   Today he reports feeling well. He reports pain in his groin, abdomen, back, and right side as well as diarrhea starting 3 days ago following his latest CT scan. He has gained 20 pounds since his last visit, and he reports no change in his appetite. He also reports tightness across his abdomen.  He reports diarrhea following lanreotide injections which is well controlled with lomotil.   REVIEW OF SYSTEMS:  Review of Systems  Constitutional:  Positive for fatigue and unexpected weight change (+20 lbs). Negative for appetite change.  Respiratory:  Positive for shortness of breath.   Gastrointestinal:  Positive for abdominal pain (6/10), constipation and diarrhea.  Genitourinary:  Positive for pelvic pain.   Musculoskeletal:  Positive for back pain (6/10) and flank pain (6/10).  Neurological:  Positive for headaches.  Psychiatric/Behavioral:  The patient is nervous/anxious.   All other systems reviewed and are negative.  PAST MEDICAL/SURGICAL HISTORY:  Past Medical History:  Diagnosis Date   Cirrhosis ([place])    Hepatitis C    Ab + 07/28/19. RNA 436,000   HTN (hypertension)    Past Surgical History:  Procedure Laterality Date   debridement  Right    per pt, debridement of rt hand    SOCIAL HISTORY:   Social History   Socioeconomic History   Marital status: Divorced    Spouse name: Not on file   Number of children: 4   Years of education: Not on file   Highest education level: Not on file  Occupational History   Occupation: unemployed  Tobacco Use   Smoking status: Never   Smokeless tobacco: Current    Types: Snuff   Tobacco comments:    started using snuff since 1986  Vaping Use   Vaping Use: Never used  Substance and Sexual Activity   Alcohol use: Not Currently    Comment: occasional   Drug use: Never   Sexual activity: Not Currently  Other Topics Concern   Not on file  Social History Narrative   Not on file   Social Determinants of Health   Financial Resource Strain: Not on file  Food Insecurity: Not on file  Transportation Needs: Not on file  Physical Activity: Not on file  Stress: Not on file  Social Connections: Not on file  Intimate Partner Violence: Not on file    FAMILY HISTORY:  Family History  Problem Relation Age of Onset   Cirrhosis Sister        per patient, due to drug use.    Alzheimer's disease Mother    Cancer Mother    Diabetes Mother    Congestive Heart Failure Father    Diabetes Father    Alzheimer's disease Paternal Grandmother    Osteoporosis Paternal Grandmother    Diabetes Daughter    Colon cancer Neg Hx  CURRENT MEDICATIONS:  Current Outpatient Medications  Medication Sig Dispense Refill   ALPRAZolam (XANAX) 0.5 MG tablet 1 tab(s)     clotrimazole (LOTRIMIN) 1 % cream Apply 1 application topically 2 (two) times daily. 30 g 3   diphenoxylate-atropine (LOMOTIL) 2.5-0.025 MG tablet Take 2 tablets after first watery stool, then 1 tablet after each watery stool.  No more than 8 tablets daily. 60 tablet 3   diphenoxylate-atropine (LOMOTIL) 2.5-0.025 MG tablet 2 tab(s)     Ensure (ENSURE) Take 237 mLs by mouth every evening.     furosemide (LASIX) 40 MG tablet TAKE 1 TABLET BY MOUTH EVERY DAY 30 tablet 3   Hepatitis A-Hep B  Recomb Vac (TWINRIX) 720-20 ELU-MCG/ML injection Inject into the muscle. Pt has last shot in July     propranolol (INDERAL) 20 MG tablet Take 1 tablet by mouth daily.     sertraline (ZOLOFT) 50 MG tablet Take 1 tablet (50 mg total) by mouth daily. 60 tablet 0   sildenafil (VIAGRA) 50 MG tablet Take by mouth.     spironolactone (ALDACTONE) 100 MG tablet 1 tab(s)     spironolactone (ALDACTONE) 100 MG tablet Take one tablet in AM 60 tablet 2   torsemide (DEMADEX) 20 MG tablet Take 2 tablets (40 mg total) by mouth daily. 60 tablet 3   ALPRAZolam (XANAX) 0.5 MG tablet Take 1 tablet (0.5 mg total) by mouth 2 (two) times daily as needed for anxiety. (Patient not taking: Reported on 02/10/2021) 60 tablet 1   No current facility-administered medications for this visit.    ALLERGIES:  No Known Allergies  PHYSICAL EXAM:  Performance status (ECOG): 1 - Symptomatic but completely ambulatory  There were no vitals filed for this visit. Wt Readings from Last 3 Encounters:  01/09/21 (!) 320 lb 6.4 oz (145.3 kg)  11/14/20 (!) 327 lb 6.4 oz (148.5 kg)  10/17/20 (!) 325 lb 6.4 oz (147.6 kg)   Physical Exam Vitals reviewed.  Constitutional:      Appearance: Normal appearance.  Cardiovascular:     Rate and Rhythm: Normal rate and regular rhythm.     Pulses: Normal pulses.     Heart sounds: Normal heart sounds.  Pulmonary:     Effort: Pulmonary effort is normal.     Breath sounds: Normal breath sounds.  Abdominal:     Palpations: Abdomen is soft.     Tenderness: There is no abdominal tenderness.  Neurological:     General: No focal deficit present.     Mental Status: He is alert and oriented to person, place, and time.  Psychiatric:        Mood and Affect: Mood normal.        Behavior: Behavior normal.     LABORATORY DATA:  I have reviewed the labs as listed.  CBC Latest Ref Rng & Units 02/06/2021 01/09/2021 11/14/2020  WBC 4.0 - 10.5 K/uL 4.9 5.7 5.9  Hemoglobin 13.0 - 17.0 g/dL 13.1 14.4  13.8  Hematocrit 39.0 - 52.0 % 40.2 42.5 40.9  Platelets 150 - 400 K/uL 162 180 153   CMP Latest Ref Rng & Units 02/06/2021 01/09/2021 11/14/2020  Glucose 70 - 99 mg/dL 163(H) 110(H) 108(H)  BUN 6 - 20 mg/dL 15 16 18   Creatinine 0.61 - 1.24 mg/dL 1.06 1.14 1.07  Sodium 135 - 145 mmol/L 135 136 134(L)  Potassium 3.5 - 5.1 mmol/L 3.6 4.2 3.9  Chloride 98 - 111 mmol/L 103 103 106  CO2 22 - 32 mmol/L  27 27 23   Calcium 8.9 - 10.3 mg/dL 8.2(L) 8.7(L) 8.1(L)  Total Protein 6.5 - 8.1 g/dL 7.6 8.4(H) 8.1  Total Bilirubin 0.3 - 1.2 mg/dL 0.9 1.3(H) 1.7(H)  Alkaline Phos 38 - 126 U/L 146(H) 151(H) 142(H)  AST 15 - 41 U/L 28 33 31  ALT 0 - 44 U/L 20 25 20     DIAGNOSTIC IMAGING:  I have independently reviewed the scans and discussed with the patient. No results found.     ASSESSMENT:  1.   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  Possible mild nodular infiltration in the left upper quadrant mesentery and anterior omentum.  Multiple small mesenteric root nodes. -PET scan on 09/05/2019 showed 4.2 cm right anterior mesenteric mass with punctate calcifications, SUV 7.2.  Moderate ascites present.  No definite retroperitoneal adenopathy.  Liver is cirrhotic with spleen enlarged.  Diffusely elevated hepatic activity which is nonspecific. -CT-guided biopsy of the mesenteric mass on 10/09/2019 at [facility] consistent with well-differentiated neuroendocrine tumor (carcinoid tumor).  Tumor cells are strongly positive for chromogranin and synaptophysin.  Negative for CK7, CK20, TTF-1, PSA, NKX3.1, S100, HMB-45, and CD56. -Gallium dotatate PET CT scan on 11/08/2019 at [facility] shows large solid mesenteric midline abdomen mass with increased activity measuring 6.5 cm, SUV of 70.  In the uncinate process there is an area of  increased activity visualized since seen on series 603 image 91 measuring 13.7 SUV.  Increased activity throughout the liver and spleen limiting evaluation for metastatic disease.  Hepatic activity is heterogeneous. -Lanreotide started on 11/13/2019.   2.  Cirrhosis and ascites: -Thought to be secondary to hepatitis C. -Last paracentesis on 08/21/2019 with 4.8 L removed.   PLAN:  1.  Well differentiated neuroendocrine tumor of the mesenteric mass: - Reviewed CT abdomen and pelvis with contrast from 02/07/2021 done in [place]. - 4.7 cm soft tissue mass along the mesenteric root present.  This was measured as 5.3 cm on prior scan from February 2022.  Large volume ascites present.  No significant abdominal or pelvic adenopathy. - Reviewed labs from 02/06/2021 which showed chromogranin improved to 155 from 193 previously.  CBC was normal. - Continue lanreotide monthly.  No monthly labs needed. - RTC 3 months with repeat labs and chromogranin.   2.  Cirrhosis and ascites: - He has completed sofosbuvir for treatment of hepatitis C. - Follow-up with GI.    3.  Fluid retention: - He gained 20 pounds since last visit. - Will increase torsemide to 40 mg daily for the next 1 week.  He will continue spironolactone 100 mg daily. - If there is no improvement in the abdominal distention, consider paracentesis.   4.  Anxiety/panic attacks: - Continue Xanax 0.5 mg twice daily as needed.   Orders placed this encounter:  No orders of the defined types were placed in this encounter.    Derek Jack, MD [place] [phone]   I, Thana Ates, am acting as a scribe for Dr. Derek Jack.  I, Derek Jack MD, have reviewed the above documentation for accuracy and completeness, and I agree with the above.

## 2021-02-10 ENCOUNTER — Other Ambulatory Visit (HOSPITAL_COMMUNITY): Payer: Medicaid - Out of State

## 2021-02-10 ENCOUNTER — Inpatient Hospital Stay (HOSPITAL_COMMUNITY): Payer: Medicaid - Out of State

## 2021-02-10 ENCOUNTER — Other Ambulatory Visit: Payer: Self-pay

## 2021-02-10 ENCOUNTER — Inpatient Hospital Stay (HOSPITAL_BASED_OUTPATIENT_CLINIC_OR_DEPARTMENT_OTHER): Payer: Medicaid - Out of State | Admitting: Hematology

## 2021-02-10 VITALS — BP 126/84 | HR 72 | Temp 98.9°F | Resp 18 | Wt 340.6 lb

## 2021-02-10 DIAGNOSIS — C7A00 Malignant carcinoid tumor of unspecified site: Secondary | ICD-10-CM

## 2021-02-10 DIAGNOSIS — K7460 Unspecified cirrhosis of liver: Secondary | ICD-10-CM

## 2021-02-10 DIAGNOSIS — C7A098 Malignant carcinoid tumors of other sites: Secondary | ICD-10-CM | POA: Diagnosis not present

## 2021-02-10 DIAGNOSIS — R188 Other ascites: Secondary | ICD-10-CM

## 2021-02-10 MED ORDER — LANREOTIDE ACETATE 120 MG/0.5ML ~~LOC~~ SOLN
120.0000 mg | Freq: Once | SUBCUTANEOUS | Status: AC
  Administered 2021-02-10: 120 mg via SUBCUTANEOUS
  Filled 2021-02-10: qty 120

## 2021-02-10 NOTE — Patient Instructions (Signed)
[place]  Discharge Instructions: Thank you for choosing [facility] to provide your oncology and hematology care.  If you have a lab appointment with the [facility], please come in thru the Main Entrance and check in at the main information desk.  Wear comfortable clothing and clothing appropriate for easy access to any Portacath or PICC line.   We strive to give you quality time with your provider. You may need to reschedule your appointment if you arrive late (15 or more minutes).  Arriving late affects you and other patients whose appointments are after yours.  Also, if you miss three or more appointments without notifying the office, you may be dismissed from the clinic at the provider's discretion.      For prescription refill requests, have your pharmacy contact our office and allow 72 hours for refills to be completed.    Today you received Lanreotide injection.     BELOW ARE SYMPTOMS THAT SHOULD BE REPORTED IMMEDIATELY: *FEVER GREATER THAN 100.4 F (38 C) OR HIGHER *CHILLS OR SWEATING *NAUSEA AND VOMITING THAT IS NOT CONTROLLED WITH YOUR NAUSEA MEDICATION *UNUSUAL SHORTNESS OF BREATH *UNUSUAL BRUISING OR BLEEDING *URINARY PROBLEMS (pain or burning when urinating, or frequent urination) *BOWEL PROBLEMS (unusual diarrhea, constipation, pain near the anus) TENDERNESS IN MOUTH AND THROAT WITH OR WITHOUT PRESENCE OF ULCERS (sore throat, sores in mouth, or a toothache) UNUSUAL RASH, SWELLING OR PAIN  UNUSUAL VAGINAL DISCHARGE OR ITCHING   Items with * indicate a potential emergency and should be followed up as soon as possible or go to the Emergency Department if any problems should occur.  Please show the CHEMOTHERAPY ALERT CARD or IMMUNOTHERAPY ALERT CARD at check-in to the Emergency Department and triage nurse.  Should you have questions after your visit or need to cancel or reschedule your appointment, please contact [facility]  [phone]  and follow the prompts.  Office hours are 8:00 a.m. to 4:30 p.m. Monday - Friday. Please note that voicemails left after 4:00 p.m. may not be returned until the following business day.  We are closed weekends and major holidays. You have access to a nurse at all times for urgent questions. Please call the main number to the clinic [phone] and follow the prompts.  For any non-urgent questions, you may also contact your provider using MyChart. We now offer e-Visits for anyone 55 and older to request care online for non-urgent symptoms. For details visit [email or web address].   Also download the MyChart app! Go to the app store, search "MyChart", open the app, select [facility], and log in with your MyChart username and password.  Due to Covid, a mask is required upon entering the hospital/clinic. If you do not have a mask, one will be given to you upon arrival. For doctor visits, patients may have 1 support person aged 69 or older with them. For treatment visits, patients cannot have anyone with them due to current Covid guidelines and our immunocompromised population.

## 2021-02-10 NOTE — Progress Notes (Signed)
Nicolas [name] presents today for injection per the provider's orders.  Lanreotide administration without incident; injection site WNL; see MAR for injection details.  Patient tolerated procedure well and without incident.  No questions or complaints noted at this time.   Lanreotide given today per MD orders. Tolerated infusion without adverse affects. Vital signs stable. No complaints at this time. Discharged from clinic ambulatory in stable condition. Alert and oriented x 3. F/U with [facility] as scheduled.

## 2021-02-10 NOTE — Progress Notes (Signed)
Patient has been assessed, vital signs and labs have been reviewed by Dr. Katragadda. ANC, Creatinine, LFTs, and Platelets are within treatment parameters per Dr. Katragadda. The patient is good to proceed with Lanreotide treatment at this time.  Primary RN and pharmacy aware.  

## 2021-02-10 NOTE — Patient Instructions (Addendum)
[place] at [facility] Discharge Instructions  You were seen and examined today by Dr. Delton Coombes. He reviewed your most recent labs and everything looks good. He wants you to increase the furosemide to two pills daily until tightness resolves. If it has not gotten better in a week please call. Continue taking the spironolactone once daily.   Thank you for choosing [place] at [facility] to provide your oncology and hematology care.  To afford each patient quality time with our provider, please arrive at least 15 minutes before your scheduled appointment time.   If you have a lab appointment with the [facility] please come in thru the Main Entrance and check in at the main information desk.  You need to re-schedule your appointment should you arrive 10 or more minutes late.  We strive to give you quality time with our providers, and arriving late affects you and other patients whose appointments are after yours.  Also, if you no show three or more times for appointments you may be dismissed from the clinic at the providers discretion.     Again, thank you for choosing [facility].  Our hope is that these requests will decrease the amount of time that you wait before being seen by our physicians.       _____________________________________________________________  Should you have questions after your visit to [facility], please contact our office at [phone] and follow the prompts.  Our office hours are 8:00 a.m. and 4:30 p.m. Monday - Friday.  Please note that voicemails left after 4:00 p.m. may not be returned until the following business day.  We are closed weekends and major holidays.  You do have access to a nurse 24-7, just call the main number to the clinic [phone] and do not press any options, hold on the line and a nurse will answer the phone.    For prescription refill requests, have your pharmacy  contact our office and allow 72 hours.    Due to Covid, you will need to wear a mask upon entering the hospital. If you do not have a mask, a mask will be given to you at the Main Entrance upon arrival. For doctor visits, patients may have 1 support person age 25 or older with them. For treatment visits, patients can not have anyone with them due to social distancing guidelines and our immunocompromised population.

## 2021-03-10 ENCOUNTER — Encounter (HOSPITAL_COMMUNITY): Payer: Self-pay

## 2021-03-10 ENCOUNTER — Other Ambulatory Visit: Payer: Self-pay

## 2021-03-10 ENCOUNTER — Inpatient Hospital Stay (HOSPITAL_COMMUNITY): Payer: Medicaid - Out of State | Attending: Nurse Practitioner

## 2021-03-10 VITALS — BP 117/71 | HR 68 | Temp 98.5°F | Resp 20 | Wt 329.8 lb

## 2021-03-10 DIAGNOSIS — C7A00 Malignant carcinoid tumor of unspecified site: Secondary | ICD-10-CM

## 2021-03-10 DIAGNOSIS — C7A019 Malignant carcinoid tumor of the small intestine, unspecified portion: Secondary | ICD-10-CM | POA: Insufficient documentation

## 2021-03-10 MED ORDER — LANREOTIDE ACETATE 120 MG/0.5ML ~~LOC~~ SOLN
120.0000 mg | Freq: Once | SUBCUTANEOUS | Status: AC
  Administered 2021-03-10: 14:00:00 120 mg via SUBCUTANEOUS
  Filled 2021-03-10: qty 120

## 2021-03-10 NOTE — Patient Instructions (Signed)
[place]  Discharge Instructions: Thank you for choosing [facility] to provide your oncology and hematology care.  If you have a lab appointment with the [facility], please come in thru the Main Entrance and check in at the main information desk.  Wear comfortable clothing and clothing appropriate for easy access to any Portacath or PICC line.   We strive to give you quality time with your provider. You may need to reschedule your appointment if you arrive late (15 or more minutes).  Arriving late affects you and other patients whose appointments are after yours.  Also, if you miss three or more appointments without notifying the office, you may be dismissed from the clinic at the provider's discretion.      For prescription refill requests, have your pharmacy contact our office and allow 72 hours for refills to be completed.    Today you received the following Lanreotide, return as scheduled.   To help prevent nausea and vomiting after your treatment, we encourage you to take your nausea medication as directed.  BELOW ARE SYMPTOMS THAT SHOULD BE REPORTED IMMEDIATELY: *FEVER GREATER THAN 100.4 F (38 C) OR HIGHER *CHILLS OR SWEATING *NAUSEA AND VOMITING THAT IS NOT CONTROLLED WITH YOUR NAUSEA MEDICATION *UNUSUAL SHORTNESS OF BREATH *UNUSUAL BRUISING OR BLEEDING *URINARY PROBLEMS (pain or burning when urinating, or frequent urination) *BOWEL PROBLEMS (unusual diarrhea, constipation, pain near the anus) TENDERNESS IN MOUTH AND THROAT WITH OR WITHOUT PRESENCE OF ULCERS (sore throat, sores in mouth, or a toothache) UNUSUAL RASH, SWELLING OR PAIN  UNUSUAL VAGINAL DISCHARGE OR ITCHING   Items with * indicate a potential emergency and should be followed up as soon as possible or go to the Emergency Department if any problems should occur.  Please show the CHEMOTHERAPY ALERT CARD or IMMUNOTHERAPY ALERT CARD at check-in to the Emergency Department and triage  nurse.  Should you have questions after your visit or need to cancel or reschedule your appointment, please contact [facility] [phone]  and follow the prompts.  Office hours are 8:00 a.m. to 4:30 p.m. Monday - Friday. Please note that voicemails left after 4:00 p.m. may not be returned until the following business day.  We are closed weekends and major holidays. You have access to a nurse at all times for urgent questions. Please call the main number to the clinic [phone] and follow the prompts.  For any non-urgent questions, you may also contact your provider using MyChart. We now offer e-Visits for anyone 69 and older to request care online for non-urgent symptoms. For details visit [email or web address].   Also download the MyChart app! Go to the app store, search "MyChart", open the app, select [facility], and log in with your MyChart username and password.  Due to Covid, a mask is required upon entering the hospital/clinic. If you do not have a mask, one will be given to you upon arrival. For doctor visits, patients may have 1 support person aged 19 or older with them. For treatment visits, patients cannot have anyone with them due to current Covid guidelines and our immunocompromised population.

## 2021-03-10 NOTE — Progress Notes (Signed)
Patient tolerated injection with no complaints voiced. Site clean and dry with no bruising or swelling noted at site. See MAR for details. Band aid applied.  Patient stable during and after injection. Patient declined AVS. VSS with discharge and left in satisfactory condition with no s/s of distress noted.

## 2021-03-11 MED ORDER — EPOETIN ALFA-EPBX 20000 UNIT/ML IJ SOLN
INTRAMUSCULAR | Status: AC
  Filled 2021-03-11: qty 1

## 2021-03-11 MED ORDER — EPOETIN ALFA-EPBX 40000 UNIT/ML IJ SOLN
INTRAMUSCULAR | Status: AC
  Filled 2021-03-11: qty 1

## 2021-04-08 ENCOUNTER — Encounter (HOSPITAL_COMMUNITY): Payer: Self-pay

## 2021-04-08 ENCOUNTER — Other Ambulatory Visit: Payer: Self-pay

## 2021-04-08 ENCOUNTER — Inpatient Hospital Stay (HOSPITAL_COMMUNITY): Payer: Medicaid - Out of State | Attending: Nurse Practitioner

## 2021-04-08 VITALS — BP 111/72 | HR 76 | Temp 97.7°F | Resp 20 | Wt 351.0 lb

## 2021-04-08 DIAGNOSIS — C7A019 Malignant carcinoid tumor of the small intestine, unspecified portion: Secondary | ICD-10-CM | POA: Diagnosis present

## 2021-04-08 DIAGNOSIS — C7A00 Malignant carcinoid tumor of unspecified site: Secondary | ICD-10-CM

## 2021-04-08 MED ORDER — LANREOTIDE ACETATE 120 MG/0.5ML ~~LOC~~ SOLN
120.0000 mg | Freq: Once | SUBCUTANEOUS | Status: AC
  Administered 2021-04-08: 15:00:00 120 mg via SUBCUTANEOUS
  Filled 2021-04-08: qty 120

## 2021-04-08 NOTE — Patient Instructions (Signed)
[facility]  Discharge Instructions: ?Thank you for choosing [facility] to provide your oncology and hematology care.  ?If you have a lab appointment with the Cancer Center, please come in thru the Main Entrance and check in at the main information desk. ? ? ? ?We strive to give you quality time with your provider. You may need to reschedule your appointment if you arrive late (15 or more minutes).  Arriving late affects you and other patients whose appointments are after yours.  Also, if you miss three or more appointments without notifying the office, you may be dismissed from the clinic at the provider?s discretion.    ?  ?For prescription refill requests, have your pharmacy contact our office and allow 72 hours for refills to be completed.   ? ?  ?To help prevent nausea and vomiting after your treatment, we encourage you to take your nausea medication as directed. ? ?BELOW ARE SYMPTOMS THAT SHOULD BE REPORTED IMMEDIATELY: ?*FEVER GREATER THAN 100.4 F (38 ?C) OR HIGHER ?*CHILLS OR SWEATING ?*NAUSEA AND VOMITING THAT IS NOT CONTROLLED WITH YOUR NAUSEA MEDICATION ?*UNUSUAL SHORTNESS OF BREATH ?*UNUSUAL BRUISING OR BLEEDING ?*URINARY PROBLEMS (pain or burning when urinating, or frequent urination) ?*BOWEL PROBLEMS (unusual diarrhea, constipation, pain near the anus) ?TENDERNESS IN MOUTH AND THROAT WITH OR WITHOUT PRESENCE OF ULCERS (sore throat, sores in mouth, or a toothache) ?UNUSUAL RASH, SWELLING OR PAIN  ?UNUSUAL VAGINAL DISCHARGE OR ITCHING  ? ?Items with * indicate a potential emergency and should be followed up as soon as possible or go to the Emergency Department if any problems should occur. ? ?Please show the CHEMOTHERAPY ALERT CARD or IMMUNOTHERAPY ALERT CARD at check-in to the Emergency Department and triage nurse. ? ?Should you have questions after your visit or need to cancel or reschedule your appointment, please contact [facility] [phone]  and follow  the prompts.  Office hours are 8:00 a.m. to 4:30 p.m. Monday - Friday. Please note that voicemails left after 4:00 p.m. may not be returned until the following business day.  We are closed weekends and major holidays. You have access to a nurse at all times for urgent questions. Please call the main number to the clinic [phone] and follow the prompts. ? ?For any non-urgent questions, you may also contact your provider using MyChart. We now offer e-Visits for anyone 18 and older to request care online for non-urgent symptoms. For details visit [email or web address]. ?  ?Also download the MyChart app! Go to the app store, search "MyChart", open the app, select [facility], and log in with your MyChart username and password. ? ?Due to Covid, a mask is required upon entering the hospital/clinic. If you do not have a mask, one will be given to you upon arrival. For doctor visits, patients may have 1 support person aged 18 or older with them. For treatment visits, patients cannot have anyone with them due to current Covid guidelines and our immunocompromised population.  ?

## 2021-04-08 NOTE — Progress Notes (Signed)
Injection given per orders. Patient tolerated it well without problems. Vitals stable and discharged home from clinic ambulatory. Follow up as scheduled.  

## 2021-04-15 ENCOUNTER — Other Ambulatory Visit (HOSPITAL_COMMUNITY): Payer: Self-pay | Admitting: Hematology

## 2021-04-15 DIAGNOSIS — K7460 Unspecified cirrhosis of liver: Secondary | ICD-10-CM

## 2021-04-15 DIAGNOSIS — R188 Other ascites: Secondary | ICD-10-CM

## 2021-04-29 ENCOUNTER — Other Ambulatory Visit: Payer: Self-pay

## 2021-04-29 ENCOUNTER — Inpatient Hospital Stay (HOSPITAL_COMMUNITY): Payer: Medicaid - Out of State | Attending: Hematology

## 2021-04-29 DIAGNOSIS — C7A098 Malignant carcinoid tumors of other sites: Secondary | ICD-10-CM | POA: Diagnosis present

## 2021-04-29 DIAGNOSIS — K7460 Unspecified cirrhosis of liver: Secondary | ICD-10-CM

## 2021-04-29 DIAGNOSIS — R188 Other ascites: Secondary | ICD-10-CM

## 2021-04-29 DIAGNOSIS — R609 Edema, unspecified: Secondary | ICD-10-CM | POA: Insufficient documentation

## 2021-04-29 DIAGNOSIS — K7469 Other cirrhosis of liver: Secondary | ICD-10-CM | POA: Diagnosis not present

## 2021-04-29 DIAGNOSIS — C7A00 Malignant carcinoid tumor of unspecified site: Secondary | ICD-10-CM

## 2021-04-29 LAB — COMPREHENSIVE METABOLIC PANEL
ALT: 19 U/L (ref 0–44)
AST: 26 U/L (ref 15–41)
Albumin: 2.8 g/dL — ABNORMAL LOW (ref 3.5–5.0)
Alkaline Phosphatase: 137 U/L — ABNORMAL HIGH (ref 38–126)
Anion gap: 7 (ref 5–15)
BUN: 18 mg/dL (ref 6–20)
CO2: 25 mmol/L (ref 22–32)
Calcium: 8.6 mg/dL — ABNORMAL LOW (ref 8.9–10.3)
Chloride: 104 mmol/L (ref 98–111)
Creatinine, Ser: 0.97 mg/dL (ref 0.61–1.24)
GFR, Estimated: >60 mL/min (ref >60)
Glucose, Bld: 144 mg/dL — ABNORMAL HIGH (ref 70–99)
Potassium: 3.8 mmol/L (ref 3.5–5.1)
Sodium: 136 mmol/L (ref 135–145)
Total Bilirubin: 1.1 mg/dL (ref 0.3–1.2)
Total Protein: 8.1 g/dL (ref 6.5–8.1)

## 2021-04-29 LAB — CBC WITH DIFFERENTIAL/PLATELET
Abs Immature Granulocytes: 0.01 10*3/uL (ref 0.00–0.07)
Basophils Absolute: 0 10*3/uL (ref 0.0–0.1)
Basophils Relative: 1 %
Eosinophils Absolute: 0.2 10*3/uL (ref 0.0–0.5)
Eosinophils Relative: 3 %
HCT: 41.2 % (ref 39.0–52.0)
Hemoglobin: 13.6 g/dL (ref 13.0–17.0)
Immature Granulocytes: 0 %
Lymphocytes Relative: 25 %
Lymphs Abs: 1.4 10*3/uL (ref 0.7–4.0)
MCH: 33.3 pg (ref 26.0–34.0)
MCHC: 33 g/dL (ref 30.0–36.0)
MCV: 100.7 fL — ABNORMAL HIGH (ref 80.0–100.0)
Monocytes Absolute: 0.4 10*3/uL (ref 0.1–1.0)
Monocytes Relative: 8 %
Neutro Abs: 3.5 10*3/uL (ref 1.7–7.7)
Neutrophils Relative %: 63 %
Platelets: 188 10*3/uL (ref 150–400)
RBC: 4.09 MIL/uL — ABNORMAL LOW (ref 4.22–5.81)
RDW: 13.2 % (ref 11.5–15.5)
WBC: 5.5 10*3/uL (ref 4.0–10.5)
nRBC: 0 % (ref 0.0–0.2)

## 2021-04-29 LAB — MAGNESIUM: Magnesium: 1.8 mg/dL (ref 1.7–2.4)

## 2021-05-02 LAB — CHROMOGRANIN A: Chromogranin A (ng/mL): 177.2 ng/mL — ABNORMAL HIGH (ref 0.0–101.8)

## 2021-05-06 ENCOUNTER — Inpatient Hospital Stay (HOSPITAL_COMMUNITY): Payer: Medicaid - Out of State

## 2021-05-06 ENCOUNTER — Other Ambulatory Visit: Payer: Self-pay

## 2021-05-06 ENCOUNTER — Inpatient Hospital Stay (HOSPITAL_BASED_OUTPATIENT_CLINIC_OR_DEPARTMENT_OTHER): Payer: Medicaid - Out of State | Admitting: Hematology

## 2021-05-06 ENCOUNTER — Encounter (HOSPITAL_COMMUNITY): Payer: Self-pay | Admitting: Hematology

## 2021-05-06 VITALS — BP 125/76 | HR 79 | Temp 99.0°F | Resp 18 | Ht 74.5 in | Wt 340.0 lb

## 2021-05-06 DIAGNOSIS — C7A00 Malignant carcinoid tumor of unspecified site: Secondary | ICD-10-CM

## 2021-05-06 DIAGNOSIS — R188 Other ascites: Secondary | ICD-10-CM | POA: Diagnosis not present

## 2021-05-06 DIAGNOSIS — K7460 Unspecified cirrhosis of liver: Secondary | ICD-10-CM | POA: Diagnosis not present

## 2021-05-06 DIAGNOSIS — C7A098 Malignant carcinoid tumors of other sites: Secondary | ICD-10-CM | POA: Diagnosis not present

## 2021-05-06 MED ORDER — LANREOTIDE ACETATE 120 MG/0.5ML ~~LOC~~ SOLN
120.0000 mg | Freq: Once | SUBCUTANEOUS | Status: AC
  Administered 2021-05-06: 15:00:00 120 mg via SUBCUTANEOUS

## 2021-05-06 MED ORDER — LANREOTIDE ACETATE 120 MG/0.5ML ~~LOC~~ SOLN
SUBCUTANEOUS | Status: AC
  Filled 2021-05-06: qty 120

## 2021-05-06 MED ORDER — PANTOPRAZOLE SODIUM 40 MG PO TBEC: 40.0000 mg | DELAYED_RELEASE_TABLET | Freq: Every day | ORAL | 3 refills | Status: DC

## 2021-05-06 NOTE — Progress Notes (Signed)
Patient has been assessed, vital signs and labs have been reviewed by Dr. Katragadda. ANC, Creatinine, LFTs, and Platelets are within treatment parameters per Dr. Katragadda. The patient is good to proceed with Lanreotide treatment at this time.  Primary RN and pharmacy aware.  

## 2021-05-06 NOTE — Progress Notes (Addendum)
[place], [place] [address]   CLINIC:  Medical Oncology/Hematology  PCP:  Olin Hauser, [address] / [place] [address] [phone]   REASON FOR VISIT:  Follow-up for neuroendocrine tumor, on lanreotid  PRIOR THERAPY: none  NGS Results: not done  CURRENT THERAPY: Lanreotide monthly  BRIEF ONCOLOGIC HISTORY:  Oncology History   No history exists.    CANCER STAGING: Cancer Staging  No matching staging information was found for the patient.  INTERVAL HISTORY:  [name], a 56 y.o. male, returns for routine follow-up of his neuroendocrine tumor, on lanreotid. Nicolas [name] was last seen on 02/10/2021.  He reports some burning sensation in the epigastric region since last visit.  He is taking fluid pills as prescribed with Lasix and spironolactone once daily.  He reports that he is able to control his fluid overload very well with the diuretics.  He also reported on and off metallic taste in the mouth since last visit.    REVIEW OF SYSTEMS:  Review of Systems  All other systems reviewed and are negative.  PAST MEDICAL/SURGICAL HISTORY:  Past Medical History:  Diagnosis Date   Cirrhosis ([place])    Hepatitis C    Ab + 07/28/19. RNA 436,000   HTN (hypertension)    Past Surgical History:  Procedure Laterality Date   debridement  Right    per pt, debridement of rt hand    SOCIAL HISTORY:  Social History   Socioeconomic History   Marital status: Divorced    Spouse name: Not on file   Number of children: 4   Years of education: Not on file   Highest education level: Not on file  Occupational History   Occupation: unemployed  Tobacco Use   Smoking status: Never   Smokeless tobacco: Current    Types: Snuff   Tobacco comments:    started using snuff since 1986  Vaping Use   Vaping Use: Never used  Substance and Sexual Activity   Alcohol use: Not Currently    Comment: occasional   Drug use: Never    Sexual activity: Not Currently  Other Topics Concern   Not on file  Social History Narrative   Not on file   Social Determinants of Health   Financial Resource Strain: Not on file  Food Insecurity: Not on file  Transportation Needs: Not on file  Physical Activity: Not on file  Stress: Not on file  Social Connections: Not on file  Intimate Partner Violence: Not on file    FAMILY HISTORY:  Family History  Problem Relation Age of Onset   Cirrhosis Sister        per patient, due to drug use.    Alzheimer's disease Mother    Cancer Mother    Diabetes Mother    Congestive Heart Failure Father    Diabetes Father    Alzheimer's disease Paternal Grandmother    Osteoporosis Paternal Grandmother    Diabetes Daughter    Colon cancer Neg Hx     CURRENT MEDICATIONS:  Current Outpatient Medications  Medication Sig Dispense Refill   ALPRAZolam (XANAX) 0.5 MG tablet Take 1 tablet (0.5 mg total) by mouth 2 (two) times daily as needed for anxiety. 60 tablet 1   ALPRAZolam (XANAX) 0.5 MG tablet 1 tab(s)     clotrimazole (LOTRIMIN) 1 % cream Apply 1 application topically 2 (two) times daily. 30 g 3   diphenoxylate-atropine (LOMOTIL) 2.5-0.025 MG tablet Take 2  tablets after first watery stool, then 1 tablet after each watery stool.  No more than 8 tablets daily. 60 tablet 3   diphenoxylate-atropine (LOMOTIL) 2.5-0.025 MG tablet 2 tab(s)     Ensure (ENSURE) Take 237 mLs by mouth every evening.     furosemide (LASIX) 40 MG tablet TAKE 1 TABLET BY MOUTH EVERY DAY 30 tablet 3   Hepatitis A-Hep B Recomb Vac (TWINRIX) 720-20 ELU-MCG/ML injection Inject into the muscle. Pt has last shot in July     propranolol (INDERAL) 20 MG tablet Take 1 tablet by mouth daily.     sertraline (ZOLOFT) 50 MG tablet Take 1 tablet (50 mg total) by mouth daily. 60 tablet 0   sildenafil (VIAGRA) 50 MG tablet Take by mouth.     sildenafil (VIAGRA) 50 MG tablet Take by mouth.     spironolactone (ALDACTONE) 100 MG  tablet 1 tab(s)     spironolactone (ALDACTONE) 100 MG tablet Take one tablet in AM 60 tablet 2   torsemide (DEMADEX) 20 MG tablet Take 2 tablets (40 mg total) by mouth daily. 60 tablet 3   No current facility-administered medications for this visit.   Facility-Administered Medications Ordered in Other Visits  Medication Dose Route Frequency Provider Last Rate Last Admin   epoetin alfa-epbx (RETACRIT) 91638 UNIT/ML injection            epoetin alfa-epbx (RETACRIT) 46659 UNIT/ML injection             ALLERGIES:  No Known Allergies  PHYSICAL EXAM:  Performance status (ECOG): 1 - Symptomatic but completely ambulatory  There were no vitals filed for this visit. Wt Readings from Last 3 Encounters:  04/08/21 (!) 351 lb (159.2 kg)  03/10/21 (!) 329 lb 12.9 oz (149.6 kg)  02/10/21 (!) 340 lb 9.6 oz (154.5 kg)   Physical Exam Vitals reviewed.  Constitutional:      Appearance: Normal appearance.  Cardiovascular:     Rate and Rhythm: Normal rate and regular rhythm.  Pulmonary:     Effort: Pulmonary effort is normal.     Breath sounds: Normal breath sounds.  Abdominal:     Palpations: Abdomen is soft.  Neurological:     Mental Status: He is alert.     LABORATORY DATA:  I have reviewed the labs as listed.  CBC Latest Ref Rng & Units 04/29/2021 02/06/2021 01/09/2021  WBC 4.0 - 10.5 K/uL 5.5 4.9 5.7  Hemoglobin 13.0 - 17.0 g/dL 13.6 13.1 14.4  Hematocrit 39.0 - 52.0 % 41.2 40.2 42.5  Platelets 150 - 400 K/uL 188 162 180   CMP Latest Ref Rng & Units 04/29/2021 02/06/2021 01/09/2021  Glucose 70 - 99 mg/dL 144(H) 163(H) 110(H)  BUN 6 - 20 mg/dL 18 15 16   Creatinine 0.61 - 1.24 mg/dL 0.97 1.06 1.14  Sodium 135 - 145 mmol/L 136 135 136  Potassium 3.5 - 5.1 mmol/L 3.8 3.6 4.2  Chloride 98 - 111 mmol/L 104 103 103  CO2 22 - 32 mmol/L 25 27 27   Calcium 8.9 - 10.3 mg/dL 8.6(L) 8.2(L) 8.7(L)  Total Protein 6.5 - 8.1 g/dL 8.1 7.6 8.4(H)  Total Bilirubin 0.3 - 1.2 mg/dL 1.1 0.9 1.3(H)   Alkaline Phos 38 - 126 U/L 137(H) 146(H) 151(H)  AST 15 - 41 U/L 26 28 33  ALT 0 - 44 U/L 19 20 25     DIAGNOSTIC IMAGING:  I have independently reviewed the scans and discussed with the patient. No results found.   ASSESSMENT:  1.  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  Possible mild nodular infiltration in the left upper quadrant mesentery and anterior omentum.  Multiple small mesenteric root nodes. -PET scan on 09/05/2019 showed 4.2 cm right anterior mesenteric mass with punctate calcifications, SUV 7.2.  Moderate ascites present.  No definite retroperitoneal adenopathy.  Liver is cirrhotic with spleen enlarged.  Diffusely elevated hepatic activity which is nonspecific. -CT-guided biopsy of the mesenteric mass on 10/09/2019 at [facility] consistent with well-differentiated neuroendocrine tumor (carcinoid tumor).  Tumor cells are strongly positive for chromogranin and synaptophysin.  Negative for CK7, CK20, TTF-1, PSA, NKX3.1, S100, HMB-45, and CD56. -Gallium dotatate PET CT scan on 11/08/2019 at [facility] shows large solid mesenteric midline abdomen mass with increased activity measuring 6.5 cm, SUV of 70.  In the uncinate process there is an area of increased activity visualized since seen on series 603 image 91 measuring 13.7 SUV.  Increased activity throughout the liver and spleen limiting evaluation for metastatic disease.  Hepatic activity is heterogeneous. -Lanreotide started on 11/13/2019.   2.  Cirrhosis and ascites: -Thought to be secondary to hepatitis C. -Last paracentesis on 08/21/2019 with 4.8 L removed.   PLAN:  1.  Well differentiated neuroendocrine tumor of the mesenteric mass: -CTAP with contrast from 02/07/2021 done in [place] showed 4.8 cm soft tissue mass along the root of the  mesentery, measured 5.3 cm on prior scan from February 2022.  Large volume ascites present.  No significant abdominal or pelvic adenopathy. - He does not have any carcinoid syndrome symptoms at this time.  He is tolerating lanreotide very well. -Reviewed labs from 04/29/2021 which showed normal LFTs.  CBC was grossly normal.  Chromogranin was stable at 177. - I will continue lanreotide monthly at this time. - He reports stomach burning in the epigastric region since last visit.  He is not able to recollect the burning pains relation to eating habits.  Recommend starting Protonix and see if it helps.  He will call us in a month if it does not get better.  He also reported metallic taste for which I have suggested to suck on some lozenges. - RTC 3 months with CTAP with contrast, chromogranin and other labs.   2.  Cirrhosis and ascites: -He has completed sofosbuvir for treatment of hepatitis C.    3.  Fluid retention: -Continue furosemide 40 mg daily and spironolactone 100 mg daily.  His weight is stable.  He does not report any abdominal distention.   Orders placed this encounter:  No orders of the defined types were placed in this encounter.    Derek Jack, MD [place] [phone]   I, Thana Ates, am acting as a scribe for Dr. Derek Jack.  I, Derek Jack MD, have reviewed the above documentation for accuracy and completeness, and I agree with the above.

## 2021-05-06 NOTE — Patient Instructions (Signed)
[place]  Discharge Instructions: Thank you for choosing [facility] to provide your oncology and hematology care.  If you have a lab appointment with the [facility], please come in thru the Main Entrance and check in at the main information desk.  Wear comfortable clothing and clothing appropriate for easy access to any Portacath or PICC line.   We strive to give you quality time with your provider. You may need to reschedule your appointment if you arrive late (15 or more minutes).  Arriving late affects you and other patients whose appointments are after yours.  Also, if you miss three or more appointments without notifying the office, you may be dismissed from the clinic at the providers discretion.      For prescription refill requests, have your pharmacy contact our office and allow 72 hours for refills to be completed.    Today you received the following Lanreotide, return as scheduled.   To help prevent nausea and vomiting after your treatment, we encourage you to take your nausea medication as directed.  BELOW ARE SYMPTOMS THAT SHOULD BE REPORTED IMMEDIATELY: *FEVER GREATER THAN 100.4 F (38 C) OR HIGHER *CHILLS OR SWEATING *NAUSEA AND VOMITING THAT IS NOT CONTROLLED WITH YOUR NAUSEA MEDICATION *UNUSUAL SHORTNESS OF BREATH *UNUSUAL BRUISING OR BLEEDING *URINARY PROBLEMS (pain or burning when urinating, or frequent urination) *BOWEL PROBLEMS (unusual diarrhea, constipation, pain near the anus) TENDERNESS IN MOUTH AND THROAT WITH OR WITHOUT PRESENCE OF ULCERS (sore throat, sores in mouth, or a toothache) UNUSUAL RASH, SWELLING OR PAIN  UNUSUAL VAGINAL DISCHARGE OR ITCHING   Items with * indicate a potential emergency and should be followed up as soon as possible or go to the Emergency Department if any problems should occur.  Please show the CHEMOTHERAPY ALERT CARD or IMMUNOTHERAPY ALERT CARD at check-in to the Emergency Department and triage  nurse.  Should you have questions after your visit or need to cancel or reschedule your appointment, please contact [facility] [phone]  and follow the prompts.  Office hours are 8:00 a.m. to 4:30 p.m. Monday - Friday. Please note that voicemails left after 4:00 p.m. may not be returned until the following business day.  We are closed weekends and major holidays. You have access to a nurse at all times for urgent questions. Please call the main number to the clinic [phone] and follow the prompts.  For any non-urgent questions, you may also contact your provider using MyChart. We now offer e-Visits for anyone 75 and older to request care online for non-urgent symptoms. For details visit [email or web address].   Also download the MyChart app! Go to the app store, search "MyChart", open the app, select [facility], and log in with your MyChart username and password.  Due to Covid, a mask is required upon entering the hospital/clinic. If you do not have a mask, one will be given to you upon arrival. For doctor visits, patients may have 1 support person aged 26 or older with them. For treatment visits, patients cannot have anyone with them due to current Covid guidelines and our immunocompromised population.

## 2021-05-06 NOTE — Progress Notes (Addendum)
Nicolas [name], [place] [address]   CLINIC:  Medical Oncology/Hematology  PCP:  Olin Hauser, [address] / [place] [address] [phone]   REASON FOR VISIT:  Follow-up for neuroendocrine tumor, on lanreotid  PRIOR THERAPY: none  NGS Results: not done  CURRENT THERAPY: Lanreotide monthly  BRIEF ONCOLOGIC HISTORY:  Oncology History   No history exists.    CANCER STAGING:  Cancer Staging  No matching staging information was found for the patient.  INTERVAL HISTORY:  [name], a 56 y.o. male, returns for routine follow-up of his neuroendocrine tumor, on lanreotid. Senan was last seen on 02/10/2021.   Today he reports feeling well. He reports chronic back pain. He reports a metallic taste in his mouth and burning in his stomach which has occurred daily since his last injection; he denies any association with eating. He reports fatigue. He reports increased saliva in his mouth. He denies flushing.   REVIEW OF SYSTEMS:  Review of Systems  Constitutional:  Positive for fatigue. Negative for appetite change.  Cardiovascular:  Negative for leg swelling.  Gastrointestinal:  Positive for abdominal pain (burning).  Musculoskeletal:  Positive for arthralgias (8/10 chronic) and back pain.  All other systems reviewed and are negative.  PAST MEDICAL/SURGICAL HISTORY:  Past Medical History:  Diagnosis Date   Cirrhosis ([place])    Hepatitis C    Ab + 07/28/19. RNA 436,000   HTN (hypertension)    Past Surgical History:  Procedure Laterality Date   debridement  Right    per pt, debridement of rt hand    SOCIAL HISTORY:  Social History   Socioeconomic History   Marital status: Divorced    Spouse name: Not on file   Number of children: 4   Years of education: Not on file   Highest education level: Not on file  Occupational History   Occupation: unemployed  Tobacco Use   Smoking status: Never   Smokeless  tobacco: Current    Types: Snuff   Tobacco comments:    started using snuff since 1986  Vaping Use   Vaping Use: Never used  Substance and Sexual Activity   Alcohol use: Not Currently    Comment: occasional   Drug use: Never   Sexual activity: Not Currently  Other Topics Concern   Not on file  Social History Narrative   Not on file   Social Determinants of Health   Financial Resource Strain: Not on file  Food Insecurity: Not on file  Transportation Needs: Not on file  Physical Activity: Not on file  Stress: Not on file  Social Connections: Not on file  Intimate Partner Violence: Not on file    FAMILY HISTORY:  Family History  Problem Relation Age of Onset   Cirrhosis Sister        per patient, due to drug use.    Alzheimer's disease Mother    Cancer Mother    Diabetes Mother    Congestive Heart Failure Father    Diabetes Father    Alzheimer's disease Paternal Grandmother    Osteoporosis Paternal Grandmother    Diabetes Daughter    Colon cancer Neg Hx     CURRENT MEDICATIONS:  Current Outpatient Medications  Medication Sig Dispense Refill   clotrimazole (LOTRIMIN) 1 % cream Apply 1 application topically 2 (two) times daily. 30 g 3   diphenoxylate-atropine (LOMOTIL) 2.5-0.025 MG tablet Take 2 tablets after first watery stool, then 1 tablet  after each watery stool.  No more than 8 tablets daily. 60 tablet 3   diphenoxylate-atropine (LOMOTIL) 2.5-0.025 MG tablet 2 tab(s)     Ensure (ENSURE) Take 237 mLs by mouth every evening.     furosemide (LASIX) 40 MG tablet TAKE 1 TABLET BY MOUTH EVERY DAY 30 tablet 3   Hepatitis A-Hep B Recomb Vac (TWINRIX) 720-20 ELU-MCG/ML injection Inject into the muscle. Pt has last shot in July     pantoprazole (PROTONIX) 40 MG tablet Take 1 tablet (40 mg total) by mouth daily. 30 tablet 3   propranolol (INDERAL) 20 MG tablet Take 1 tablet by mouth daily.     sertraline (ZOLOFT) 50 MG tablet Take 1 tablet (50 mg total) by mouth daily. 60  tablet 0   sildenafil (VIAGRA) 50 MG tablet Take by mouth.     sildenafil (VIAGRA) 50 MG tablet Take by mouth.     spironolactone (ALDACTONE) 100 MG tablet 1 tab(s)     spironolactone (ALDACTONE) 100 MG tablet Take one tablet in AM 60 tablet 2   No current facility-administered medications for this visit.   Facility-Administered Medications Ordered in Other Visits  Medication Dose Route Frequency Provider Last Rate Last Admin   epoetin alfa-epbx (RETACRIT) 09381 UNIT/ML injection            epoetin alfa-epbx (RETACRIT) 82993 UNIT/ML injection             ALLERGIES:  No Known Allergies  PHYSICAL EXAM:  Performance status (ECOG): 1 - Symptomatic but completely ambulatory  Vitals:   05/06/21 1422  BP: 125/76  Pulse: 79  Resp: 18  Temp: 99 F (37.2 C)  SpO2: 96%   Wt Readings from Last 3 Encounters:  05/06/21 (!) 340 lb (154.2 kg)  04/08/21 (!) 351 lb (159.2 kg)  03/10/21 (!) 329 lb 12.9 oz (149.6 kg)   Physical Exam Vitals reviewed.  Constitutional:      Appearance: Normal appearance. He is obese.  Cardiovascular:     Rate and Rhythm: Normal rate and regular rhythm.     Pulses: Normal pulses.     Heart sounds: Normal heart sounds.  Pulmonary:     Effort: Pulmonary effort is normal.     Breath sounds: Normal breath sounds.  Abdominal:     Palpations: Abdomen is soft. There is no hepatomegaly, splenomegaly or mass.     Tenderness: There is no abdominal tenderness.  Neurological:     General: No focal deficit present.     Mental Status: He is alert and oriented to person, place, and time.  Psychiatric:        Mood and Affect: Mood normal.        Behavior: Behavior normal.     LABORATORY DATA:  I have reviewed the labs as listed.  CBC Latest Ref Rng & Units 04/29/2021 02/06/2021 01/09/2021  WBC 4.0 - 10.5 K/uL 5.5 4.9 5.7  Hemoglobin 13.0 - 17.0 g/dL 13.6 13.1 14.4  Hematocrit 39.0 - 52.0 % 41.2 40.2 42.5  Platelets 150 - 400 K/uL 188 162 180   CMP Latest Ref  Rng & Units 04/29/2021 02/06/2021 01/09/2021  Glucose 70 - 99 mg/dL 144(H) 163(H) 110(H)  BUN 6 - 20 mg/dL 18 15 16   Creatinine 0.61 - 1.24 mg/dL 0.97 1.06 1.14  Sodium 135 - 145 mmol/L 136 135 136  Potassium 3.5 - 5.1 mmol/L 3.8 3.6 4.2  Chloride 98 - 111 mmol/L 104 103 103  CO2 22 - 32 mmol/L 25 27  27  Calcium 8.9 - 10.3 mg/dL 8.6(L) 8.2(L) 8.7(L)  Total Protein 6.5 - 8.1 g/dL 8.1 7.6 8.4(H)  Total Bilirubin 0.3 - 1.2 mg/dL 1.1 0.9 1.3(H)  Alkaline Phos 38 - 126 U/L 137(H) 146(H) 151(H)  AST 15 - 41 U/L 26 28 33  ALT 0 - 44 U/L 19 20 25     DIAGNOSTIC IMAGING:  I have independently reviewed the scans and discussed with the patient. No results found.   ASSESSMENT:  1.   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  Possible mild nodular infiltration in the left upper quadrant mesentery and anterior omentum.  Multiple small mesenteric root nodes. -PET scan on 09/05/2019 showed 4.2 cm right anterior mesenteric mass with punctate calcifications, SUV 7.2.  Moderate ascites present.  No definite retroperitoneal adenopathy.  Liver is cirrhotic with spleen enlarged.  Diffusely elevated hepatic activity which is nonspecific. -CT-guided biopsy of the mesenteric mass on 10/09/2019 at [facility] consistent with well-differentiated neuroendocrine tumor (carcinoid tumor).  Tumor cells are strongly positive for chromogranin and synaptophysin.  Negative for CK7, CK20, TTF-1, PSA, NKX3.1, S100, HMB-45, and CD56. -Gallium dotatate PET CT scan on 11/08/2019 at [facility] shows large solid mesenteric midline abdomen mass with increased activity measuring 6.5 cm, SUV of 70.  In the uncinate process there is an area of increased activity visualized since seen on series 603 image 91 measuring 13.7 SUV.  Increased activity throughout  the liver and spleen limiting evaluation for metastatic disease.  Hepatic activity is heterogeneous. -Lanreotide started on 11/13/2019.   2.  Cirrhosis and ascites: -Thought to be secondary to hepatitis C. -Last paracentesis on 08/21/2019 with 4.8 L removed.   PLAN:  1.  Well differentiated neuroendocrine tumor of the mesenteric mass: -CTAP with contrast from 02/07/2021 done in [place] showed 4.8 cm soft tissue mass along the root of the mesentery, measured 5.3 cm on prior scan from February 2022.  Large volume ascites present.  No significant abdominal or pelvic adenopathy. - He does not have any carcinoid syndrome symptoms at this time.  He is tolerating lanreotide very well. -Reviewed labs from 04/29/2021 which showed normal LFTs.  CBC was grossly normal.  Chromogranin was stable at 177. - I will continue lanreotide monthly at this time. - He reports stomach burning in the epigastric region since last visit.  He is not able to recollect the burning pains relation to eating habits.  Recommend starting Protonix and see if it helps.  He will call us in a month if it does not get better.  He also reported metallic taste for which I have suggested to suck on some lozenges. - RTC 3 months with CTAP with contrast, chromogranin and other labs.   2.  Cirrhosis and ascites: -He has completed sofosbuvir for treatment of hepatitis C.    3.  Fluid retention: -Continue furosemide 40 mg daily and spironolactone 100 mg daily.  His weight is stable.  He does not report any abdominal distention.     Orders placed this encounter:  Orders Placed This Encounter  Procedures   CBC with Differential/Platelet   Comprehensive metabolic panel   Chromogranin A     Derek Jack, MD [place] [phone]   I, Thana Ates, am acting as a scribe for Dr. Derek Jack.  IDerek Jack MD, have reviewed the above documentation  for accuracy and completeness, and I agree  with the above.

## 2021-05-06 NOTE — Patient Instructions (Addendum)
Welcome at [facility] Discharge Instructions  You were seen and examined today by Dr. Delton Coombes. He reviewed your most recent labs and everything looks okay. Please keep follow up appointment as scheduled in 3 months.   Thank you for choosing [place] at [facility] to provide your oncology and hematology care.  To afford each patient quality time with our provider, please arrive at least 15 minutes before your scheduled appointment time.   If you have a lab appointment with the [facility] please come in thru the Main Entrance and check in at the main information desk.  You need to re-schedule your appointment should you arrive 10 or more minutes late.  We strive to give you quality time with our providers, and arriving late affects you and other patients whose appointments are after yours.  Also, if you no show three or more times for appointments you may be dismissed from the clinic at the providers discretion.     Again, thank you for choosing [facility].  Our hope is that these requests will decrease the amount of time that you wait before being seen by our physicians.       _____________________________________________________________  Should you have questions after your visit to [facility], please contact our office at [phone] and follow the prompts.  Our office hours are 8:00 a.m. and 4:30 p.m. Monday - Friday.  Please note that voicemails left after 4:00 p.m. may not be returned until the following business day.  We are closed weekends and major holidays.  You do have access to a nurse 24-7, just call the main number to the clinic [phone] and do not press any options, hold on the line and a nurse will answer the phone.    For prescription refill requests, have your pharmacy contact our office and allow 72 hours.    Due to Covid, you will need to wear a mask upon entering the hospital. If you do  not have a mask, a mask will be given to you at the Main Entrance upon arrival. For doctor visits, patients may have 1 support person age 31 or older with them. For treatment visits, patients can not have anyone with them due to social distancing guidelines and our immunocompromised population.

## 2021-05-06 NOTE — Progress Notes (Signed)
Patient tolerated injection with no complaints voiced. Site clean and dry with no bruising or swelling noted at site. See MAR for details. Band aid applied.  Patient stable during and after injection. VSS with discharge and left in satisfactory condition with no s/s of distress noted.  

## 2021-06-03 ENCOUNTER — Inpatient Hospital Stay (HOSPITAL_COMMUNITY): Payer: Medicaid - Out of State | Attending: Nurse Practitioner

## 2021-06-03 ENCOUNTER — Other Ambulatory Visit: Payer: Self-pay

## 2021-06-03 VITALS — BP 119/67 | HR 71 | Temp 97.9°F | Resp 18 | Ht 74.5 in

## 2021-06-03 DIAGNOSIS — C7A019 Malignant carcinoid tumor of the small intestine, unspecified portion: Secondary | ICD-10-CM | POA: Insufficient documentation

## 2021-06-03 DIAGNOSIS — C7A00 Malignant carcinoid tumor of unspecified site: Secondary | ICD-10-CM

## 2021-06-03 MED ORDER — LANREOTIDE ACETATE 120 MG/0.5ML ~~LOC~~ SOLN
120.0000 mg | Freq: Once | SUBCUTANEOUS | Status: AC
  Administered 2021-06-03: 120 mg via SUBCUTANEOUS

## 2021-06-03 NOTE — Progress Notes (Signed)
Nicolas [name] presents today for lanreotide injection per the provider's orders.  Stable during administration without incident; injection site WNL; see MAR for injection details.  Patient tolerated procedure well and without incident.  No questions or complaints noted at this time. Discharge from clinic ambulatory in stable condition.  Alert and oriented X 3.  Follow up with [facility] as scheduled.

## 2021-06-03 NOTE — Patient Instructions (Signed)
Nicolas [name]  Discharge Instructions: Thank you for choosing [facility] to provide your oncology and hematology care.  If you have a lab appointment with the [facility], please come in thru the Main Entrance and check in at the main information desk.  Wear comfortable clothing and clothing appropriate for easy access to any Portacath or PICC line.   We strive to give you quality time with your provider. You may need to reschedule your appointment if you arrive late (15 or more minutes).  Arriving late affects you and other patients whose appointments are after yours.  Also, if you miss three or more appointments without notifying the office, you may be dismissed from the clinic at the providers discretion.      For prescription refill requests, have your pharmacy contact our office and allow 72 hours for refills to be completed.    Today you received the following chemotherapy and/or immunotherapy agents Lanreotide      To help prevent nausea and vomiting after your treatment, we encourage you to take your nausea medication as directed.  BELOW ARE SYMPTOMS THAT SHOULD BE REPORTED IMMEDIATELY: *FEVER GREATER THAN 100.4 F (38 C) OR HIGHER *CHILLS OR SWEATING *NAUSEA AND VOMITING THAT IS NOT CONTROLLED WITH YOUR NAUSEA MEDICATION *UNUSUAL SHORTNESS OF BREATH *UNUSUAL BRUISING OR BLEEDING *URINARY PROBLEMS (pain or burning when urinating, or frequent urination) *BOWEL PROBLEMS (unusual diarrhea, constipation, pain near the anus) TENDERNESS IN MOUTH AND THROAT WITH OR WITHOUT PRESENCE OF ULCERS (sore throat, sores in mouth, or a toothache) UNUSUAL RASH, SWELLING OR PAIN  UNUSUAL VAGINAL DISCHARGE OR ITCHING   Items with * indicate a potential emergency and should be followed up as soon as possible or go to the Emergency Department if any problems should occur.  Please show the CHEMOTHERAPY ALERT CARD or IMMUNOTHERAPY ALERT CARD at check-in to the Emergency  Department and triage nurse.  Should you have questions after your visit or need to cancel or reschedule your appointment, please contact [facility] [phone]  and follow the prompts.  Office hours are 8:00 a.m. to 4:30 p.m. Monday - Friday. Please note that voicemails left after 4:00 p.m. may not be returned until the following business day.  We are closed weekends and major holidays. You have access to a nurse at all times for urgent questions. Please call the main number to the clinic [phone] and follow the prompts.  For any non-urgent questions, you may also contact your provider using MyChart. We now offer e-Visits for anyone 56 and older to request care online for non-urgent symptoms. For details visit [email or web address].   Also download the MyChart app! Go to the app store, search "MyChart", open the app, select [facility], and log in with your MyChart username and password.  Due to Covid, a mask is required upon entering the hospital/clinic. If you do not have a mask, one will be given to you upon arrival. For doctor visits, patients may have 1 support person aged 56 or older with them. For treatment visits, patients cannot have anyone with them due to current Covid guidelines and our immunocompromised population.

## 2021-07-01 ENCOUNTER — Encounter (HOSPITAL_COMMUNITY): Payer: Self-pay

## 2021-07-01 ENCOUNTER — Other Ambulatory Visit (HOSPITAL_COMMUNITY): Payer: Self-pay | Admitting: *Deleted

## 2021-07-01 ENCOUNTER — Other Ambulatory Visit: Payer: Self-pay

## 2021-07-01 ENCOUNTER — Inpatient Hospital Stay (HOSPITAL_COMMUNITY): Payer: Medicaid - Out of State | Attending: Nurse Practitioner

## 2021-07-01 VITALS — BP 120/83 | HR 70 | Temp 97.8°F | Resp 18 | Wt 316.6 lb

## 2021-07-01 DIAGNOSIS — K6389 Other specified diseases of intestine: Secondary | ICD-10-CM

## 2021-07-01 DIAGNOSIS — C7A019 Malignant carcinoid tumor of the small intestine, unspecified portion: Secondary | ICD-10-CM | POA: Diagnosis not present

## 2021-07-01 DIAGNOSIS — C7A00 Malignant carcinoid tumor of unspecified site: Secondary | ICD-10-CM

## 2021-07-01 MED ORDER — LANREOTIDE ACETATE 120 MG/0.5ML ~~LOC~~ SOLN
120.0000 mg | Freq: Once | SUBCUTANEOUS | Status: AC
  Administered 2021-07-01: 120 mg via SUBCUTANEOUS

## 2021-07-01 NOTE — Progress Notes (Signed)
Order for CT CAP sent to [facility] imaging for repeat scan in April prior to follow up. Fax ?[phone] ?

## 2021-07-01 NOTE — Patient Instructions (Signed)
Nicolas [name]  Discharge Instructions: ?Thank you for choosing [facility] to provide your oncology and hematology care.  ?If you have a lab appointment with the [facility], please come in thru the Main Entrance and check in at the main information desk. ? ?Wear comfortable clothing and clothing appropriate for easy access to any Portacath or PICC line.  ? ?We strive to give you quality time with your provider. You may need to reschedule your appointment if you arrive late (15 or more minutes).  Arriving late affects you and other patients whose appointments are after yours.  Also, if you miss three or more appointments without notifying the office, you may be dismissed from the clinic at the provider?s discretion.    ?  ?For prescription refill requests, have your pharmacy contact our office and allow 72 hours for refills to be completed.   ? ?Today you received the following chemotherapy and/or immunotherapy agents lanreotide.  ?  ?To help prevent nausea and vomiting after your treatment, we encourage you to take your nausea medication as directed. ? ?BELOW ARE SYMPTOMS THAT SHOULD BE REPORTED IMMEDIATELY: ?*FEVER GREATER THAN 100.4 F (38 ?C) OR HIGHER ?*CHILLS OR SWEATING ?*NAUSEA AND VOMITING THAT IS NOT CONTROLLED WITH YOUR NAUSEA MEDICATION ?*UNUSUAL SHORTNESS OF BREATH ?*UNUSUAL BRUISING OR BLEEDING ?*URINARY PROBLEMS (pain or burning when urinating, or frequent urination) ?*BOWEL PROBLEMS (unusual diarrhea, constipation, pain near the anus) ?TENDERNESS IN MOUTH AND THROAT WITH OR WITHOUT PRESENCE OF ULCERS (sore throat, sores in mouth, or a toothache) ?UNUSUAL RASH, SWELLING OR PAIN  ?UNUSUAL VAGINAL DISCHARGE OR ITCHING  ? ?Items with * indicate a potential emergency and should be followed up as soon as possible or go to the Emergency Department if any problems should occur. ? ?Please show the CHEMOTHERAPY ALERT CARD or IMMUNOTHERAPY ALERT CARD at check-in to the Emergency  Department and triage nurse. ? ?Should you have questions after your visit or need to cancel or reschedule your appointment, please contact [facility] [phone]  and follow the prompts.  Office hours are 8:00 a.m. to 4:30 p.m. Monday - Friday. Please note that voicemails left after 4:00 p.m. may not be returned until the following business day.  We are closed weekends and major holidays. You have access to a nurse at all times for urgent questions. Please call the main number to the clinic [phone] and follow the prompts. ? ?For any non-urgent questions, you may also contact your provider using MyChart. We now offer e-Visits for anyone 55 and older to request care online for non-urgent symptoms. For details visit [email or web address]. ?  ?Also download the MyChart app! Go to the app store, search "MyChart", open the app, select [facility], and log in with your MyChart username and password. ? ?Due to Covid, a mask is required upon entering the hospital/clinic. If you do not have a mask, one will be given to you upon arrival. For doctor visits, patients may have 1 support person aged 1 or older with them. For treatment visits, patients cannot have anyone with them due to current Covid guidelines and our immunocompromised population.  ?

## 2021-07-01 NOTE — Progress Notes (Signed)
Patient tolerated Lanreotide injection with no complaints voiced.  Patient stable during and after injection.  See MAR for details.  Site clean and dry with no bruising or swelling noted at site.  Band aid applied.  Vss with discharge and left in satisfactory condition with no s/s of distress noted.   

## 2021-07-23 ENCOUNTER — Inpatient Hospital Stay (HOSPITAL_COMMUNITY): Payer: Medicaid - Out of State | Attending: Hematology

## 2021-07-23 DIAGNOSIS — C7A00 Malignant carcinoid tumor of unspecified site: Secondary | ICD-10-CM

## 2021-07-23 DIAGNOSIS — R188 Other ascites: Secondary | ICD-10-CM | POA: Insufficient documentation

## 2021-07-23 DIAGNOSIS — C7A098 Malignant carcinoid tumors of other sites: Secondary | ICD-10-CM | POA: Diagnosis present

## 2021-07-23 DIAGNOSIS — K7460 Unspecified cirrhosis of liver: Secondary | ICD-10-CM | POA: Insufficient documentation

## 2021-07-23 DIAGNOSIS — R197 Diarrhea, unspecified: Secondary | ICD-10-CM | POA: Insufficient documentation

## 2021-07-23 LAB — COMPREHENSIVE METABOLIC PANEL
ALT: 20 U/L (ref 0–44)
AST: 29 U/L (ref 15–41)
Albumin: 3.2 g/dL — ABNORMAL LOW (ref 3.5–5.0)
Alkaline Phosphatase: 78 U/L (ref 38–126)
Anion gap: 5 (ref 5–15)
BUN: 16 mg/dL (ref 6–20)
CO2: 26 mmol/L (ref 22–32)
Calcium: 8.6 mg/dL — ABNORMAL LOW (ref 8.9–10.3)
Chloride: 104 mmol/L (ref 98–111)
Creatinine, Ser: 1.15 mg/dL (ref 0.61–1.24)
GFR, Estimated: >60 mL/min (ref >60)
Glucose, Bld: 112 mg/dL — ABNORMAL HIGH (ref 70–99)
Potassium: 4 mmol/L (ref 3.5–5.1)
Sodium: 135 mmol/L (ref 135–145)
Total Bilirubin: 1.5 mg/dL — ABNORMAL HIGH (ref 0.3–1.2)
Total Protein: 7.5 g/dL (ref 6.5–8.1)

## 2021-07-23 LAB — CBC WITH DIFFERENTIAL/PLATELET
Abs Immature Granulocytes: 0.02 10*3/uL (ref 0.00–0.07)
Basophils Absolute: 0 10*3/uL (ref 0.0–0.1)
Basophils Relative: 1 %
Eosinophils Absolute: 0.1 10*3/uL (ref 0.0–0.5)
Eosinophils Relative: 2 %
HCT: 42.5 % (ref 39.0–52.0)
Hemoglobin: 14.2 g/dL (ref 13.0–17.0)
Immature Granulocytes: 0 %
Lymphocytes Relative: 31 %
Lymphs Abs: 1.6 10*3/uL (ref 0.7–4.0)
MCH: 32.6 pg (ref 26.0–34.0)
MCHC: 33.4 g/dL (ref 30.0–36.0)
MCV: 97.7 fL (ref 80.0–100.0)
Monocytes Absolute: 0.5 10*3/uL (ref 0.1–1.0)
Monocytes Relative: 10 %
Neutro Abs: 2.9 10*3/uL (ref 1.7–7.7)
Neutrophils Relative %: 56 %
Platelets: 127 10*3/uL — ABNORMAL LOW (ref 150–400)
RBC: 4.35 MIL/uL (ref 4.22–5.81)
RDW: 13.7 % (ref 11.5–15.5)
WBC: 5.2 10*3/uL (ref 4.0–10.5)
nRBC: 0 % (ref 0.0–0.2)

## 2021-07-24 LAB — CHROMOGRANIN A: Chromogranin A (ng/mL): 165.4 ng/mL — ABNORMAL HIGH (ref 0.0–101.8)

## 2021-07-30 ENCOUNTER — Inpatient Hospital Stay (HOSPITAL_COMMUNITY): Payer: Medicaid - Out of State

## 2021-07-30 ENCOUNTER — Inpatient Hospital Stay (HOSPITAL_BASED_OUTPATIENT_CLINIC_OR_DEPARTMENT_OTHER): Payer: Medicaid - Out of State | Admitting: Hematology

## 2021-07-30 VITALS — BP 131/84 | HR 80 | Temp 98.0°F | Resp 18 | Wt 318.8 lb

## 2021-07-30 DIAGNOSIS — K7460 Unspecified cirrhosis of liver: Secondary | ICD-10-CM | POA: Diagnosis not present

## 2021-07-30 DIAGNOSIS — C7A098 Malignant carcinoid tumors of other sites: Secondary | ICD-10-CM | POA: Diagnosis not present

## 2021-07-30 DIAGNOSIS — R188 Other ascites: Secondary | ICD-10-CM | POA: Diagnosis not present

## 2021-07-30 DIAGNOSIS — C7A00 Malignant carcinoid tumor of unspecified site: Secondary | ICD-10-CM

## 2021-07-30 MED ORDER — LANREOTIDE ACETATE 120 MG/0.5ML ~~LOC~~ SOLN
120.0000 mg | Freq: Once | SUBCUTANEOUS | Status: AC
  Administered 2021-07-30: 120 mg via SUBCUTANEOUS

## 2021-07-30 NOTE — Progress Notes (Signed)
Patient tolerated Lanreotide injection with no complaints voiced.  Patient stable during and after injection.  See MAR for details.  Site clean and dry with no bruising or swelling noted at site.  Band aid applied.  Vss with discharge and left in satisfactory condition with no s/s of distress noted.   

## 2021-07-30 NOTE — Patient Instructions (Signed)
[place] at [facility] ?Discharge Instructions ? ?You were seen and examined today by Dr. Delton Coombes. ? ?Dr. Delton Coombes discussed your most recent lab work and everything looks okay. ? ?Continue getting your Lanreotide injections monthly. ? ?Please keep follow-up as scheduled in 3 months.  ? ? ?Thank you for choosing [place] at [facility] to provide your oncology and hematology care.  To afford each patient quality time with our provider, please arrive at least 15 minutes before your scheduled appointment time.  ? ?If you have a lab appointment with the [facility] please come in thru the Main Entrance and check in at the main information desk. ? ?You need to re-schedule your appointment should you arrive 10 or more minutes late.  We strive to give you quality time with our providers, and arriving late affects you and other patients whose appointments are after yours.  Also, if you no show three or more times for appointments you may be dismissed from the clinic at the providers discretion.     ?Again, thank you for choosing [facility].  Our hope is that these requests will decrease the amount of time that you wait before being seen by our physicians.       ?_____________________________________________________________ ? ?Should you have questions after your visit to [facility], please contact our office at [phone] and follow the prompts.  Our office hours are 8:00 a.m. and 4:30 p.m. Monday - Friday.  Please note that voicemails left after 4:00 p.m. may not be returned until the following business day.  We are closed weekends and major holidays.  You do have access to a nurse 24-7, just call the main number to the clinic [phone] and do not press any options, hold on the line and a nurse will answer the phone.   ? ?For prescription refill requests, have your pharmacy contact our office and allow 72 hours.   ? ?Due to Covid,  you will need to wear a mask upon entering the hospital. If you do not have a mask, a mask will be given to you at the Main Entrance upon arrival. For doctor visits, patients may have 1 support person age 34 or older with them. For treatment visits, patients can not have anyone with them due to social distancing guidelines and our immunocompromised population.  ? ?  ?

## 2021-07-30 NOTE — Patient Instructions (Signed)
[place]  Discharge Instructions: ?Thank you for choosing [facility] to provide your oncology and hematology care.  ?If you have a lab appointment with the [facility], please come in thru the Main Entrance and check in at the main information desk. ? ?Wear comfortable clothing and clothing appropriate for easy access to any Portacath or PICC line.  ? ?We strive to give you quality time with your provider. You may need to reschedule your appointment if you arrive late (15 or more minutes).  Arriving late affects you and other patients whose appointments are after yours.  Also, if you miss three or more appointments without notifying the office, you may be dismissed from the clinic at the provider?s discretion.    ?  ?For prescription refill requests, have your pharmacy contact our office and allow 72 hours for refills to be completed.   ? ?Today you received the following chemotherapy and/or immunotherapy agents lanreotide.     ?  ?To help prevent nausea and vomiting after your treatment, we encourage you to take your nausea medication as directed. ? ?BELOW ARE SYMPTOMS THAT SHOULD BE REPORTED IMMEDIATELY: ?*FEVER GREATER THAN 100.4 F (38 ?C) OR HIGHER ?*CHILLS OR SWEATING ?*NAUSEA AND VOMITING THAT IS NOT CONTROLLED WITH YOUR NAUSEA MEDICATION ?*UNUSUAL SHORTNESS OF BREATH ?*UNUSUAL BRUISING OR BLEEDING ?*URINARY PROBLEMS (pain or burning when urinating, or frequent urination) ?*BOWEL PROBLEMS (unusual diarrhea, constipation, pain near the anus) ?TENDERNESS IN MOUTH AND THROAT WITH OR WITHOUT PRESENCE OF ULCERS (sore throat, sores in mouth, or a toothache) ?UNUSUAL RASH, SWELLING OR PAIN  ?UNUSUAL VAGINAL DISCHARGE OR ITCHING  ? ?Items with * indicate a potential emergency and should be followed up as soon as possible or go to the Emergency Department if any problems should occur. ? ?Please show the CHEMOTHERAPY ALERT CARD or IMMUNOTHERAPY ALERT CARD at check-in to the Emergency  Department and triage nurse. ? ?Should you have questions after your visit or need to cancel or reschedule your appointment, please contact [facility] [phone]  and follow the prompts.  Office hours are 8:00 a.m. to 4:30 p.m. Monday - Friday. Please note that voicemails left after 4:00 p.m. may not be returned until the following business day.  We are closed weekends and major holidays. You have access to a nurse at all times for urgent questions. Please call the main number to the clinic [phone] and follow the prompts. ? ?For any non-urgent questions, you may also contact your provider using MyChart. We now offer e-Visits for anyone 43 and older to request care online for non-urgent symptoms. For details visit [email or web address]. ?  ?Also download the MyChart app! Go to the app store, search "MyChart", open the app, select [facility] [place], and log in with your MyChart username and password. ? ?Due to Covid, a mask is required upon entering the hospital/clinic. If you do not have a mask, one will be given to you upon arrival. For doctor visits, patients may have 1 support person aged 29 or older with them. For treatment visits, patients cannot have anyone with them due to current Covid guidelines and our immunocompromised population.  ?

## 2021-07-30 NOTE — Progress Notes (Signed)
. ? ?[place] ?[address] ?[place], [place] [address] ? ? ?CLINIC:  ?Medical Oncology/Hematology ? ?PCP:  ?Olin Hauser, MD ?[address] / [place] [address] ?[phone] ? ? ?REASON FOR VISIT:  ?Follow-up for neuroendocrine tumor, on lanreotid ? ?CURRENT THERAPY: Lanreotide monthly ? ?BRIEF ONCOLOGIC HISTORY:  ?Oncology History  ? No history exists.  ? ? ?CANCER STAGING: ? Cancer Staging  ?No matching staging information was found for the patient. ? ?INTERVAL HISTORY:  ?[name], a 56 y.o. male, returns for routine follow-up of his neuroendocrine tumor, on lanreotid. Nicolas [name] was last seen on 05/06/2021.  ? ?Today he reports feeling good. He is taking Lasix and Spirolactone. He has lost 22 lbs since his last visit. He reports soft diarrhea for 1 week following his injections occurring 2-3 times daily, and a week before his injection he reports 1 week of constipation. He denies wheezing and flushing. He reports occasional dizziness upon standing, and he reports feeling cold constantly.  ? ?REVIEW OF SYSTEMS:  ?Review of Systems  ?Constitutional:  Negative for appetite change and fatigue.  ?Respiratory:  Positive for shortness of breath. Negative for wheezing.   ?Gastrointestinal:  Positive for constipation and diarrhea.  ?Neurological:  Positive for dizziness.  ?All other systems reviewed and are negative. ? ?PAST MEDICAL/SURGICAL HISTORY:  ?Past Medical History:  ?Diagnosis Date  ? Cirrhosis ([place])   ? Hepatitis C   ? Ab + 07/28/19. RNA 436,000  ? HTN (hypertension)   ? ?Past Surgical History:  ?Procedure Laterality Date  ? debridement  Right   ? per pt, debridement of rt hand  ? ? ?SOCIAL HISTORY:  ?Social History  ? ?Socioeconomic History  ? Marital status: Divorced  ?  Spouse name: Not on file  ? Number of children: 4  ? Years of education: Not on file  ? Highest education level: Not on file  ?Occupational History  ? Occupation: unemployed  ?Tobacco Use  ? Smoking status:  Never  ? Smokeless tobacco: Current  ?  Types: Snuff  ? Tobacco comments:  ?  started using snuff since 1986  ?Vaping Use  ? Vaping Use: Never used  ?Substance and Sexual Activity  ? Alcohol use: Not Currently  ?  Comment: occasional  ? Drug use: Never  ? Sexual activity: Not Currently  ?Other Topics Concern  ? Not on file  ?Social History Narrative  ? Not on file  ? ?Social Determinants of Health  ? ?Financial Resource Strain: Not on file  ?Food Insecurity: Not on file  ?Transportation Needs: Not on file  ?Physical Activity: Not on file  ?Stress: Not on file  ?Social Connections: Not on file  ?Intimate Partner Violence: Not on file  ? ? ?FAMILY HISTORY:  ?Family History  ?Problem Relation Age of Onset  ? Cirrhosis Sister   ?     per patient, due to drug use.   ? Alzheimer's disease Mother   ? Cancer Mother   ? Diabetes Mother   ? Congestive Heart Failure Father   ? Diabetes Father   ? Alzheimer's disease Paternal Grandmother   ? Osteoporosis Paternal Grandmother   ? Diabetes Daughter   ? Colon cancer Neg Hx   ? ? ?CURRENT MEDICATIONS:  ?Current Outpatient Medications  ?Medication Sig Dispense Refill  ? clotrimazole (LOTRIMIN) 1 % cream Apply 1 application topically 2 (two) times daily. 30 g 3  ? diphenoxylate-atropine (LOMOTIL) 2.5-0.025 MG tablet Take 2 tablets after first watery  stool, then 1 tablet after each watery stool.  No more than 8 tablets daily. 60 tablet 3  ? diphenoxylate-atropine (LOMOTIL) 2.5-0.025 MG tablet 2 tab(s)    ? Ensure (ENSURE) Take 237 mLs by mouth every evening.    ? furosemide (LASIX) 40 MG tablet TAKE 1 TABLET BY MOUTH EVERY DAY 30 tablet 3  ? Hepatitis A-Hep B Recomb Vac (TWINRIX) 720-20 ELU-MCG/ML injection Inject into the muscle. Pt has last shot in July    ? pantoprazole (PROTONIX) 40 MG tablet Take 1 tablet (40 mg total) by mouth daily. 30 tablet 3  ? propranolol (INDERAL) 20 MG tablet Take 1 tablet by mouth daily.    ? sertraline (ZOLOFT) 50 MG tablet Take 1 tablet (50 mg total)  by mouth daily. 60 tablet 0  ? sildenafil (VIAGRA) 50 MG tablet Take by mouth.    ? sildenafil (VIAGRA) 50 MG tablet Take by mouth.    ? spironolactone (ALDACTONE) 100 MG tablet 1 tab(s)    ? spironolactone (ALDACTONE) 100 MG tablet Take one tablet in AM 60 tablet 2  ? ?No current facility-administered medications for this visit.  ? ?Facility-Administered Medications Ordered in Other Visits  ?Medication Dose Route Frequency Provider Last Rate Last Admin  ? epoetin alfa-epbx (RETACRIT) 49702 UNIT/ML injection           ? epoetin alfa-epbx (RETACRIT) 63785 UNIT/ML injection           ? ? ?ALLERGIES:  ?No Known Allergies ? ?PHYSICAL EXAM:  ?Performance status (ECOG): 1 - Symptomatic but completely ambulatory ? ?Vitals:  ? 07/30/21 1358  ?BP: 131/84  ?Pulse: 80  ?Resp: 18  ?Temp: 98 ?F (36.7 ?C)  ?SpO2: 98%  ? ?Wt Readings from Last 3 Encounters:  ?07/30/21 (!) 318 lb 12.8 oz (144.6 kg)  ?07/01/21 (!) 316 lb 9.6 oz (143.6 kg)  ?05/06/21 (!) 340 lb (154.2 kg)  ? ?Physical Exam ?Vitals reviewed.  ?Constitutional:   ?   Appearance: Normal appearance. He is obese.  ?Cardiovascular:  ?   Rate and Rhythm: Normal rate and regular rhythm.  ?   Pulses: Normal pulses.  ?   Heart sounds: Normal heart sounds.  ?Pulmonary:  ?   Effort: Pulmonary effort is normal.  ?   Breath sounds: Normal breath sounds.  ?Neurological:  ?   General: No focal deficit present.  ?   Mental Status: He is alert and oriented to person, place, and time.  ?Psychiatric:     ?   Mood and Affect: Mood normal.     ?   Behavior: Behavior normal.  ?  ? ?LABORATORY DATA:  ?I have reviewed the labs as listed.  ? ?  Latest Ref Rng & Units 07/23/2021  ? 12:30 PM 04/29/2021  ? 12:59 PM 02/06/2021  ?  8:27 AM  ?CBC  ?WBC 4.0 - 10.5 K/uL 5.2   5.5   4.9    ?Hemoglobin 13.0 - 17.0 g/dL 14.2   13.6   13.1    ?Hematocrit 39.0 - 52.0 % 42.5   41.2   40.2    ?Platelets 150 - 400 K/uL 127   188   162    ? ? ?  Latest Ref Rng & Units 07/23/2021  ? 12:30 PM 04/29/2021  ? 12:59 PM  02/06/2021  ?  8:27 AM  ?CMP  ?Glucose 70 - 99 mg/dL 112   144   163    ?BUN 6 - 20 mg/dL 16   18  15    ?Creatinine 0.61 - 1.24 mg/dL 1.15   0.97   1.06    ?Sodium 135 - 145 mmol/L 135   136   135    ?Potassium 3.5 - 5.1 mmol/L 4.0   3.8   3.6    ?Chloride 98 - 111 mmol/L 104   104   103    ?CO2 22 - 32 mmol/L '26   25   27    '$ ?Calcium 8.9 - 10.3 mg/dL 8.6   8.6   8.2    ?Total Protein 6.5 - 8.1 g/dL 7.5   8.1   7.6    ?Total Bilirubin 0.3 - 1.2 mg/dL 1.5   1.1   0.9    ?Alkaline Phos 38 - 126 U/L 78   137   146    ?AST 15 - 41 U/L '29   26   28    '$ ?ALT 0 - 44 U/L '20   19   20    '$ ? ? ?DIAGNOSTIC IMAGING:  ?I have independently reviewed the scans and discussed with the patient. ?No results found.  ? ?ASSESSMENT:  ?1.   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  Possible mild nodular infiltration in the left upper quadrant mesentery and anterior omentum.  Multiple small mesenteric root nodes. ?-PET scan on 09/05/2019 showed 4.2 cm right anterior mesenteric mass with punctate calcifications, SUV 7.2.  Moderate ascites present.  No definite retroperitoneal adenopathy.  Liver is cirrhotic with spleen enlarged.  Diffusely elevated hepatic activity which is nonspecific. ?-CT-guided biopsy of the mesenteric mass on 10/09/2019 at [facility] consistent with well-differentiated neuroendocrine tumor (carcinoid tumor).  Tumor cells are strongly positive for chromogranin and synaptophysin.  Negative for CK7, CK20, TTF-1, PSA, NKX3.1, S100, HMB-45, and CD56. ?-Gallium dotatate PET CT scan on 11/08/2019 at [facility] shows large solid mesenteric midline abdomen mass with increased activity measuring 6.5 cm, SUV of 70.  In the uncinate process there is an area of increased activity visualized since seen on series 603 image 91 measuring 13.7 SUV.   Increased activity throughout the liver and spleen limiting evaluation for metastatic disease.  Hepatic activity is heterogeneous. ?-Lanreotide started on 11/13/2019. ?- CT CAP (07/11/2021): 4.6 cm soft ti

## 2021-08-27 ENCOUNTER — Encounter (HOSPITAL_COMMUNITY): Payer: Self-pay

## 2021-08-27 ENCOUNTER — Inpatient Hospital Stay (HOSPITAL_COMMUNITY): Payer: Medicaid - Out of State | Attending: Nurse Practitioner

## 2021-08-27 VITALS — BP 121/84 | HR 74 | Temp 96.6°F | Resp 20 | Wt 317.9 lb

## 2021-08-27 DIAGNOSIS — C7A00 Malignant carcinoid tumor of unspecified site: Secondary | ICD-10-CM

## 2021-08-27 DIAGNOSIS — C7A019 Malignant carcinoid tumor of the small intestine, unspecified portion: Secondary | ICD-10-CM | POA: Diagnosis present

## 2021-08-27 MED ORDER — LANREOTIDE ACETATE 120 MG/0.5ML ~~LOC~~ SOLN
120.0000 mg | Freq: Once | SUBCUTANEOUS | Status: AC
  Administered 2021-08-27: 120 mg via SUBCUTANEOUS
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
## 2021-08-27 NOTE — Patient Instructions (Signed)
[facility]  Discharge Instructions: ?Thank you for choosing [facility] to provide your oncology and hematology care.  ?If you have a lab appointment with the [facility], please come in thru the Main Entrance and check in at the main information desk. ? ?Wear comfortable clothing and clothing appropriate for easy access to any Portacath or PICC line.  ? ?We strive to give you quality time with your provider. You may need to reschedule your appointment if you arrive late (15 or more minutes).  Arriving late affects you and other patients whose appointments are after yours.  Also, if you miss three or more appointments without notifying the office, you may be dismissed from the clinic at the provider?s discretion.    ?  ?For prescription refill requests, have your pharmacy contact our office and allow 72 hours for refills to be completed.   ? ?Today you received the following chemotherapy and/or immunotherapy agents lanreotide.  ?Lanreotide injection ?What is this medication? ?LANREOTIDE (lan REE oh tide) is used to reduce blood levels of growth hormone in patients with a condition called acromegaly. It also works to slow or stop tumor growth in patients with neuroendocrine tumors and treat carcinoid syndrome. ?This medicine may be used for other purposes; ask your health care provider or pharmacist if you have questions. ?COMMON BRAND NAME(S): Somatuline Depot ?What should I tell my care team before I take this medication? ?They need to know if you have any of these conditions: ?diabetes ?gallbladder disease ?heart disease ?kidney disease ?liver disease ?thyroid disease ?an unusual or allergic reaction to lanreotide, other medicines, foods, dyes, or preservatives ?pregnant or trying to get pregnant ?breast-feeding ?How should I use this medication? ?This medicine is for injection under the skin. It is given by a health care professional in a hospital or clinic setting. ?Contact your  pediatrician or health care professional regarding the use of this medicine in children. Special care may be needed. ?Overdosage: If you think you have taken too much of this medicine contact a poison control center or emergency room at once. ?NOTE: This medicine is only for you. Do not share this medicine with others. ?What if I miss a dose? ?It is important not to miss your dose. Call your doctor or health care professional if you are unable to keep an appointment. ?What may interact with this medication? ?This medicine may interact with the following medications: ?bromocriptine ?cyclosporine ?certain medicines for blood pressure, heart disease, irregular heart beat ?certain medicines for diabetes ?quinidine ?terfenadine ?This list may not describe all possible interactions. Give your health care provider a list of all the medicines, herbs, non-prescription drugs, or dietary supplements you use. Also tell them if you smoke, drink alcohol, or use illegal drugs. Some items may interact with your medicine. ?What should I watch for while using this medication? ?Tell your doctor or healthcare professional if your symptoms do not start to get better or if they get worse. ?Visit your doctor or health care professional for regular checks on your progress. Your condition will be monitored carefully while you are receiving this medicine. ?This medicine may increase blood sugar. Ask your healthcare provider if changes in diet or medicines are needed if you have diabetes. ?You may need blood work done while you are taking this medicine. ?Women should inform their doctor if they wish to become pregnant or think they might be pregnant. There is a potential for serious side effects to an unborn child. Talk to  your health care professional or pharmacist for more information. Do not breast-feed an infant while taking this medicine or for 6 months after stopping it. ?This medicine has caused ovarian failure in some women. This  medicine may interfere with the ability to have a child. Talk with your doctor or health care professional if you are concerned about your fertility. ?What side effects may I notice from receiving this medication? ?Side effects that you should report to your doctor or health care professional as soon as possible: ?allergic reactions like skin rash, itching or hives, swelling of the face, lips, or tongue ?increased blood pressure ?severe stomach pain ?signs and symptoms of hgh blood sugar such as being more thirsty or hungry or having to urinate more than normal. You may also feel very tired or have blurry vision. ?signs and symptoms of low blood sugar such as feeling anxious; confusion; dizziness; increased hunger; unusually weak or tired; sweating; shakiness; cold; irritable; headache; blurred vision; fast heartbeat; loss of consciousness ?unusually slow heartbeat ?Side effects that usually do not require medical attention (report to your doctor or health care professional if they continue or are bothersome): ?constipation ?diarrhea ?dizziness ?headache ?muscle pain ?muscle spasms ?nausea ?pain, redness, or irritation at site where injected ?This list may not describe all possible side effects. Call your doctor for medical advice about side effects. You may report side effects to FDA at 1-800-FDA-1088. ?Where should I keep my medication? ?This drug is given in a hospital or clinic and will not be stored at home. ?NOTE: This sheet is a summary. It may not cover all possible information. If you have questions about this medicine, talk to your doctor, pharmacist, or health care provider. ?? 2023 Elsevier/Gold Standard (2018-02-16 00:00:00) ?    ?  ?To help prevent nausea and vomiting after your treatment, we encourage you to take your nausea medication as directed. ? ?BELOW ARE SYMPTOMS THAT SHOULD BE REPORTED IMMEDIATELY: ?*FEVER GREATER THAN 100.4 F (38 ?C) OR HIGHER ?*CHILLS OR SWEATING ?*NAUSEA AND VOMITING THAT  IS NOT CONTROLLED WITH YOUR NAUSEA MEDICATION ?*UNUSUAL SHORTNESS OF BREATH ?*UNUSUAL BRUISING OR BLEEDING ?*URINARY PROBLEMS (pain or burning when urinating, or frequent urination) ?*BOWEL PROBLEMS (unusual diarrhea, constipation, pain near the anus) ?TENDERNESS IN MOUTH AND THROAT WITH OR WITHOUT PRESENCE OF ULCERS (sore throat, sores in mouth, or a toothache) ?UNUSUAL RASH, SWELLING OR PAIN  ?UNUSUAL VAGINAL DISCHARGE OR ITCHING  ? ?Items with * indicate a potential emergency and should be followed up as soon as possible or go to the Emergency Department if any problems should occur. ? ?Please show the CHEMOTHERAPY ALERT CARD or IMMUNOTHERAPY ALERT CARD at check-in to the Emergency Department and triage nurse. ? ?Should you have questions after your visit or need to cancel or reschedule your appointment, please contact [facility] [phone]  and follow the prompts.  Office hours are 8:00 a.m. to 4:30 p.m. Monday - Friday. Please note that voicemails left after 4:00 p.m. may not be returned until the following business day.  We are closed weekends and major holidays. You have access to a nurse at all times for urgent questions. Please call the main number to the clinic [phone] and follow the prompts. ? ?For any non-urgent questions, you may also contact your provider using MyChart. We now offer e-Visits for anyone 26 and older to request care online for non-urgent symptoms. For details visit [email or web address]. ?  ?Also download the MyChart app! Go to the app store, search "MyChart", open  the app, select [facility], and log in with your MyChart username and password. ? ?Due to Covid, a mask is required upon entering the hospital/clinic. If you do not have a mask, one will be given to you upon arrival. For doctor visits, patients may have 1 support person aged 24 or older with them. For treatment visits, patients cannot have anyone with them due to current Covid guidelines and our  immunocompromised population.  ?

## 2021-09-07 ENCOUNTER — Other Ambulatory Visit (HOSPITAL_COMMUNITY): Payer: Self-pay | Admitting: Hematology

## 2021-09-07 DIAGNOSIS — R188 Other ascites: Secondary | ICD-10-CM

## 2021-09-09 ENCOUNTER — Encounter (HOSPITAL_COMMUNITY): Payer: Self-pay | Admitting: Hematology

## 2021-09-24 ENCOUNTER — Encounter (HOSPITAL_COMMUNITY): Payer: Self-pay

## 2021-09-24 ENCOUNTER — Inpatient Hospital Stay (HOSPITAL_COMMUNITY): Payer: Medicaid - Out of State | Attending: Nurse Practitioner

## 2021-09-24 VITALS — BP 129/85 | HR 71 | Temp 97.9°F | Resp 20 | Wt 313.1 lb

## 2021-09-24 DIAGNOSIS — R188 Other ascites: Secondary | ICD-10-CM

## 2021-09-24 DIAGNOSIS — C7A00 Malignant carcinoid tumor of unspecified site: Secondary | ICD-10-CM

## 2021-09-24 DIAGNOSIS — C7A019 Malignant carcinoid tumor of the small intestine, unspecified portion: Secondary | ICD-10-CM | POA: Diagnosis present

## 2021-09-24 MED ORDER — PANTOPRAZOLE SODIUM 40 MG PO TBEC: 40.0000 mg | DELAYED_RELEASE_TABLET | Freq: Every day | ORAL | 3 refills | Status: DC

## 2021-09-24 MED ORDER — LANREOTIDE ACETATE 120 MG/0.5ML ~~LOC~~ SOLN
120.0000 mg | Freq: Once | SUBCUTANEOUS | Status: AC
  Administered 2021-09-24: 120 mg via SUBCUTANEOUS
  Filled 2021-09-24: qty 120

## 2021-09-24 NOTE — Progress Notes (Signed)
Jensen Kilburg presents today for injection per the provider's orders.  Lanreotide 120 mg Ona administration without incident; injection site WNL; see MAR for injection details.  Patient tolerated procedure well and without incident.  No questions or complaints noted at this time.

## 2021-09-24 NOTE — Patient Instructions (Addendum)
[facility]  Discharge Instructions: Thank you for choosing [facility] to provide your oncology and hematology care.  If you have a lab appointment with the [facility], please come in thru the Main Entrance and check in at the main information desk.  Wear comfortable clothing and clothing appropriate for easy access to any Portacath or PICC line.   We strive to give you quality time with your provider. You may need to reschedule your appointment if you arrive late (15 or more minutes).  Arriving late affects you and other patients whose appointments are after yours.  Also, if you miss three or more appointments without notifying the office, you may be dismissed from the clinic at the provider's discretion.      For prescription refill requests, have your pharmacy contact our office and allow 72 hours for refills to be completed.    Today you received the following chemotherapy and/or immunotherapy agents Lanreotide injection 120 mg [place].  Lanreotide injection What is this medication? LANREOTIDE (lan REE oh tide) is used to reduce blood levels of growth hormone in patients with a condition called acromegaly. It also works to slow or stop tumor growth in patients with neuroendocrine tumors and treat carcinoid syndrome. This medicine may be used for other purposes; ask your health care provider or pharmacist if you have questions. COMMON BRAND NAME(S): Somatuline [place] What should I tell my care team before I take this medication? They need to know if you have any of these conditions: diabetes gallbladder disease heart disease kidney disease liver disease thyroid disease an unusual or allergic reaction to lanreotide, other medicines, foods, dyes, or preservatives pregnant or trying to get pregnant breast-feeding How should I use this medication? This medicine is for injection under the skin. It is given by a health care professional in a hospital or clinic  setting. Contact your pediatrician or health care professional regarding the use of this medicine in children. Special care may be needed. Overdosage: If you think you have taken too much of this medicine contact a poison control center or emergency room at once. NOTE: This medicine is only for you. Do not share this medicine with others. What if I miss a dose? It is important not to miss your dose. Call your doctor or health care professional if you are unable to keep an appointment. What may interact with this medication? This medicine may interact with the following medications: bromocriptine cyclosporine certain medicines for blood pressure, heart disease, irregular heart beat certain medicines for diabetes quinidine terfenadine This list may not describe all possible interactions. Give your health care provider a list of all the medicines, herbs, non-prescription drugs, or dietary supplements you use. Also tell them if you smoke, drink alcohol, or use illegal drugs. Some items may interact with your medicine. What should I watch for while using this medication? Tell your doctor or healthcare professional if your symptoms do not start to get better or if they get worse. Visit your doctor or health care professional for regular checks on your progress. Your condition will be monitored carefully while you are receiving this medicine. This medicine may increase blood sugar. Ask your healthcare provider if changes in diet or medicines are needed if you have diabetes. You may need blood work done while you are taking this medicine. Women should inform their doctor if they wish to become pregnant or think they might be pregnant. There is a potential for serious side effects to an  unborn child. Talk to your health care professional or pharmacist for more information. Do not breast-feed an infant while taking this medicine or for 6 months after stopping it. This medicine has caused ovarian failure  in some women. This medicine may interfere with the ability to have a child. Talk with your doctor or health care professional if you are concerned about your fertility. What side effects may I notice from receiving this medication? Side effects that you should report to your doctor or health care professional as soon as possible: allergic reactions like skin rash, itching or hives, swelling of the face, lips, or tongue increased blood pressure severe stomach pain signs and symptoms of hgh blood sugar such as being more thirsty or hungry or having to urinate more than normal. You may also feel very tired or have blurry vision. signs and symptoms of low blood sugar such as feeling anxious; confusion; dizziness; increased hunger; unusually weak or tired; sweating; shakiness; cold; irritable; headache; blurred vision; fast heartbeat; loss of consciousness unusually slow heartbeat Side effects that usually do not require medical attention (report to your doctor or health care professional if they continue or are bothersome): constipation diarrhea dizziness headache muscle pain muscle spasms nausea pain, redness, or irritation at site where injected This list may not describe all possible side effects. Call your doctor for medical advice about side effects. You may report side effects to FDA at 1-800-FDA-1088. Where should I keep my medication? This drug is given in a hospital or clinic and will not be stored at home. NOTE: This sheet is a summary. It may not cover all possible information. If you have questions about this medicine, talk to your doctor, pharmacist, or health care provider.  2023 Elsevier/Gold Standard (2018-02-16 00:00:00)       To help prevent nausea and vomiting after your treatment, we encourage you to take your nausea medication as directed.  BELOW ARE SYMPTOMS THAT SHOULD BE REPORTED IMMEDIATELY: *FEVER GREATER THAN 100.4 F (38 C) OR HIGHER *CHILLS OR SWEATING *NAUSEA  AND VOMITING THAT IS NOT CONTROLLED WITH YOUR NAUSEA MEDICATION *UNUSUAL SHORTNESS OF BREATH *UNUSUAL BRUISING OR BLEEDING *URINARY PROBLEMS (pain or burning when urinating, or frequent urination) *BOWEL PROBLEMS (unusual diarrhea, constipation, pain near the anus) TENDERNESS IN MOUTH AND THROAT WITH OR WITHOUT PRESENCE OF ULCERS (sore throat, sores in mouth, or a toothache) UNUSUAL RASH, SWELLING OR PAIN  UNUSUAL VAGINAL DISCHARGE OR ITCHING   Items with * indicate a potential emergency and should be followed up as soon as possible or go to the Emergency Department if any problems should occur.  Please show the CHEMOTHERAPY ALERT CARD or IMMUNOTHERAPY ALERT CARD at check-in to the Emergency Department and triage nurse.  Should you have questions after your visit or need to cancel or reschedule your appointment, please contact [facility] [phone]  and follow the prompts.  Office hours are 8:00 a.m. to 4:30 p.m. Monday - Friday. Please note that voicemails left after 4:00 p.m. may not be returned until the following business day.  We are closed weekends and major holidays. You have access to a nurse at all times for urgent questions. Please call the main number to the clinic [phone] and follow the prompts.  For any non-urgent questions, you may also contact your provider using MyChart. We now offer e-Visits for anyone 63 and older to request care online for non-urgent symptoms. For details visit [email or web address].   Also download the MyChart app! Go to the app  store, search "MyChart", open the app, select [facility], and log in with your MyChart username and password.  Masks are optional in the cancer centers. If you would like for your care team to wear a mask while they are taking care of you, please let them know. For doctor visits, patients may have with them one support person who is at least 56 years old. At this time, visitors are not allowed in the infusion  area.

## 2021-10-22 ENCOUNTER — Inpatient Hospital Stay (HOSPITAL_COMMUNITY): Payer: Medicaid - Out of State

## 2021-10-22 ENCOUNTER — Other Ambulatory Visit (HOSPITAL_COMMUNITY): Payer: Self-pay | Admitting: *Deleted

## 2021-10-22 ENCOUNTER — Inpatient Hospital Stay (HOSPITAL_COMMUNITY): Payer: Medicaid - Out of State | Attending: Nurse Practitioner | Admitting: Hematology

## 2021-10-22 ENCOUNTER — Other Ambulatory Visit: Payer: Self-pay

## 2021-10-22 ENCOUNTER — Encounter (HOSPITAL_COMMUNITY): Payer: Self-pay | Admitting: Hematology

## 2021-10-22 VITALS — BP 119/79 | HR 65 | Temp 97.6°F | Resp 16 | Ht 74.8 in | Wt 310.4 lb

## 2021-10-22 VITALS — BP 162/72 | HR 80 | Resp 18

## 2021-10-22 DIAGNOSIS — R188 Other ascites: Secondary | ICD-10-CM

## 2021-10-22 DIAGNOSIS — C7A00 Malignant carcinoid tumor of unspecified site: Secondary | ICD-10-CM

## 2021-10-22 DIAGNOSIS — C7A019 Malignant carcinoid tumor of the small intestine, unspecified portion: Secondary | ICD-10-CM | POA: Diagnosis not present

## 2021-10-22 DIAGNOSIS — K7460 Unspecified cirrhosis of liver: Secondary | ICD-10-CM | POA: Diagnosis not present

## 2021-10-22 LAB — COMPREHENSIVE METABOLIC PANEL
ALT: 24 U/L (ref 0–44)
AST: 31 U/L (ref 15–41)
Albumin: 3.1 g/dL — ABNORMAL LOW (ref 3.5–5.0)
Alkaline Phosphatase: 127 U/L — ABNORMAL HIGH (ref 38–126)
Anion gap: 6 (ref 5–15)
BUN: 18 mg/dL (ref 6–20)
CO2: 25 mmol/L (ref 22–32)
Calcium: 8.4 mg/dL — ABNORMAL LOW (ref 8.9–10.3)
Chloride: 105 mmol/L (ref 98–111)
Creatinine, Ser: 1.12 mg/dL (ref 0.61–1.24)
GFR, Estimated: >60 mL/min (ref >60)
Glucose, Bld: 103 mg/dL — ABNORMAL HIGH (ref 70–99)
Potassium: 4.3 mmol/L (ref 3.5–5.1)
Sodium: 136 mmol/L (ref 135–145)
Total Bilirubin: 1.3 mg/dL — ABNORMAL HIGH (ref 0.3–1.2)
Total Protein: 8.1 g/dL (ref 6.5–8.1)

## 2021-10-22 LAB — CBC WITH DIFFERENTIAL/PLATELET
Abs Immature Granulocytes: 0.01 10*3/uL (ref 0.00–0.07)
Basophils Absolute: 0 10*3/uL (ref 0.0–0.1)
Basophils Relative: 1 %
Eosinophils Absolute: 0.2 10*3/uL (ref 0.0–0.5)
Eosinophils Relative: 3 %
HCT: 41.7 % (ref 39.0–52.0)
Hemoglobin: 14.1 g/dL (ref 13.0–17.0)
Immature Granulocytes: 0 %
Lymphocytes Relative: 25 %
Lymphs Abs: 1.6 10*3/uL (ref 0.7–4.0)
MCH: 33.2 pg (ref 26.0–34.0)
MCHC: 33.8 g/dL (ref 30.0–36.0)
MCV: 98.1 fL (ref 80.0–100.0)
Monocytes Absolute: 0.4 10*3/uL (ref 0.1–1.0)
Monocytes Relative: 7 %
Neutro Abs: 4.1 10*3/uL (ref 1.7–7.7)
Neutrophils Relative %: 64 %
Platelets: 145 10*3/uL — ABNORMAL LOW (ref 150–400)
RBC: 4.25 MIL/uL (ref 4.22–5.81)
RDW: 13.7 % (ref 11.5–15.5)
WBC: 6.3 10*3/uL (ref 4.0–10.5)
nRBC: 0 % (ref 0.0–0.2)

## 2021-10-22 LAB — TSH: TSH: 4.489 u[IU]/mL (ref 0.350–4.500)

## 2021-10-22 MED ORDER — LANREOTIDE ACETATE 120 MG/0.5ML ~~LOC~~ SOLN
120.0000 mg | Freq: Once | SUBCUTANEOUS | Status: AC
  Administered 2021-10-22: 120 mg via SUBCUTANEOUS

## 2021-10-22 NOTE — Progress Notes (Signed)
[place], [place] [address]   CLINIC:  Medical Oncology/Hematology  PCP:  Olin Hauser, [address] / [place] [address] [phone]   REASON FOR VISIT:  Follow-up for neuroendocrine tumor, on lanreotid  PRIOR THERAPY: none  NGS Results: not done  CURRENT THERAPY: Lanreotide monthly  BRIEF ONCOLOGIC HISTORY:  Oncology History   No history exists.    CANCER STAGING: Cancer Staging  No matching staging information was found for the patient.  INTERVAL HISTORY:  Nicolas [name], a 56 y.o. male, returns for routine follow-up of his neuroendocrine tumor, on lanreotid. Jese was last seen on 07/30/2021.   Today he reports feeling good. He reports mild constipation for which he eats Rasin Bran. He has lost 8 lbs since his last visit. He continues to take Lasix and spirolactone. He has diarrhea 2-3 times a day for 1 week following his injection. He reports stable right sided abdominal pain. He is no longer taking Protonix as he reports the burning in his chest has improved. He continues to feel cold constantly.   REVIEW OF SYSTEMS:  Review of Systems  Constitutional:  Negative for appetite change and fatigue. Unexpected weight change: -8 lbs. Gastrointestinal:  Positive for abdominal pain (R side - stable) and constipation. Negative for diarrhea (resolved).  Musculoskeletal:  Positive for back pain (7/10 chronic).  All other systems reviewed and are negative.   PAST MEDICAL/SURGICAL HISTORY:  Past Medical History:  Diagnosis Date   Cirrhosis ([place])    Hepatitis C    Ab + 07/28/19. RNA 436,000   HTN (hypertension)    Past Surgical History:  Procedure Laterality Date   debridement  Right    per pt, debridement of rt hand    SOCIAL HISTORY:  Social History   Socioeconomic History   Marital status: Divorced    Spouse name: Not on file   Number of children: 4   Years of education: Not on file   Highest  education level: Not on file  Occupational History   Occupation: unemployed  Tobacco Use   Smoking status: Never   Smokeless tobacco: Current    Types: Snuff   Tobacco comments:    started using snuff since 1986  Vaping Use   Vaping Use: Never used  Substance and Sexual Activity   Alcohol use: Not Currently    Comment: occasional   Drug use: Never   Sexual activity: Not Currently  Other Topics Concern   Not on file  Social History Narrative   Not on file   Social Determinants of Health   Financial Resource Strain: Low Risk  (08/02/2019)   Overall Financial Resource Strain (CARDIA)    Difficulty of Paying Living Expenses: Not very hard  Food Insecurity: No Food Insecurity (08/02/2019)   Hunger Vital Sign    Worried About Running Out of Food in the Last Year: Never true    Ran Out of Food in the Last Year: Never true  Transportation Needs: Unmet Transportation Needs (08/02/2019)   PRAPARE - Transportation    Lack of Transportation (Medical): Yes    Lack of Transportation (Non-Medical): Yes  Physical Activity: Inactive (08/02/2019)   Exercise Vital Sign    Days of Exercise per Week: 0 days    Minutes of Exercise per Session: 0 min  Stress: Stress Concern Present (08/02/2019)   [place]    Feeling of Stress : To some extent  Social Connections: Socially Isolated (08/02/2019)   Social Connection and Isolation Panel [NHANES]    Frequency of Communication with Friends and Family: Three times a week    Frequency of Social Gatherings with Friends and Family: Three times a week    Attends Religious Services: Never    Active Member of Clubs or Organizations: No    Attends Archivist Meetings: Never    Marital Status: Divorced  Human resources officer Violence: Not At Risk (08/02/2019)   Humiliation, Afraid, Rape, and Kick questionnaire    Fear of Current or Ex-Partner: No    Emotionally Abused: No    Physically  Abused: No    Sexually Abused: No    FAMILY HISTORY:  Family History  Problem Relation Age of Onset   Cirrhosis Sister        per patient, due to drug use.    Alzheimer's disease Mother    Cancer Mother    Diabetes Mother    Congestive Heart Failure Father    Diabetes Father    Alzheimer's disease Paternal Grandmother    Osteoporosis Paternal Grandmother    Diabetes Daughter    Colon cancer Neg Hx     CURRENT MEDICATIONS:  Current Outpatient Medications  Medication Sig Dispense Refill   clotrimazole (LOTRIMIN) 1 % cream Apply 1 application topically 2 (two) times daily. 30 g 3   diphenoxylate-atropine (LOMOTIL) 2.5-0.025 MG tablet Take 2 tablets after first watery stool, then 1 tablet after each watery stool.  No more than 8 tablets daily. 60 tablet 3   diphenoxylate-atropine (LOMOTIL) 2.5-0.025 MG tablet 2 tab(s)     Ensure (ENSURE) Take 237 mLs by mouth every evening.     furosemide (LASIX) 40 MG tablet TAKE 1 TABLET BY MOUTH EVERY DAY 30 tablet 3   Hepatitis A-Hep B Recomb Vac (TWINRIX) 720-20 ELU-MCG/ML injection Inject into the muscle. Pt has last shot in July     pantoprazole (PROTONIX) 40 MG tablet Take 1 tablet (40 mg total) by mouth daily. 30 tablet 3   propranolol (INDERAL) 20 MG tablet Take 1 tablet by mouth daily.     sertraline (ZOLOFT) 50 MG tablet Take 1 tablet (50 mg total) by mouth daily. 60 tablet 0   sildenafil (VIAGRA) 50 MG tablet Take by mouth.     sildenafil (VIAGRA) 50 MG tablet Take by mouth.     spironolactone (ALDACTONE) 100 MG tablet 1 tab(s)     spironolactone (ALDACTONE) 100 MG tablet Take one tablet in AM 60 tablet 2   No current facility-administered medications for this visit.   Facility-Administered Medications Ordered in Other Visits  Medication Dose Route Frequency Provider Last Rate Last Admin   epoetin alfa-epbx (RETACRIT) 81448 UNIT/ML injection            epoetin alfa-epbx (RETACRIT) 18563 UNIT/ML injection            lanreotide  acetate (SOMATULINE DEPOT) injection 120 mg  120 mg Subcutaneous Once Derek Jack, MD        ALLERGIES:  No Known Allergies  PHYSICAL EXAM:  Performance status (ECOG): 1 - Symptomatic but completely ambulatory  Vitals:   10/22/21 1341  BP: 119/79  Pulse: 65  Resp: 16  Temp: 97.6 F (36.4 C)  SpO2: 100%   Wt Readings from Last 3 Encounters:  10/22/21 (!) 310 lb 6.5 oz (140.8 kg)  09/24/21 (!) 313 lb 0.9 oz (142 kg)  08/27/21 (!) 317 lb 14.5 oz (144.2 kg)   Physical Exam  Vitals reviewed.  Constitutional:      Appearance: Normal appearance. He is obese.  Cardiovascular:     Rate and Rhythm: Normal rate and regular rhythm.     Pulses: Normal pulses.     Heart sounds: Normal heart sounds.  Pulmonary:     Effort: Pulmonary effort is normal.     Breath sounds: Normal breath sounds.  Neurological:     General: No focal deficit present.     Mental Status: He is alert and oriented to person, place, and time.  Psychiatric:        Mood and Affect: Mood normal.        Behavior: Behavior normal.      LABORATORY DATA:  I have reviewed the labs as listed.     Latest Ref Rng & Units 10/22/2021   11:46 AM 07/23/2021   12:30 PM 04/29/2021   12:59 PM  CBC  WBC 4.0 - 10.5 K/uL 6.3  5.2  5.5   Hemoglobin 13.0 - 17.0 g/dL 14.1  14.2  13.6   Hematocrit 39.0 - 52.0 % 41.7  42.5  41.2   Platelets 150 - 400 K/uL 145  127  188       Latest Ref Rng & Units 10/22/2021   11:46 AM 07/23/2021   12:30 PM 04/29/2021   12:59 PM  CMP  Glucose 70 - 99 mg/dL 103  112  144   BUN 6 - 20 mg/dL _0 Creatinine 0.61 - 1.24 mg/dL 1.12  1.15  0.97   Sodium 135 - 145 mmol/L 136  135  136   Potassium 3.5 - 5.1 mmol/L 4.3  4.0  3.8   Chloride 98 - 111 mmol/L 105  104  104   CO2 22 - 32 mmol/L _1 Calcium 8.9 - 10.3 mg/dL 8.4  8.6  8.6   Total Protein 6.5 - 8.1 g/dL 8.1  7.5  8.1   Total Bilirubin 0.3 - 1.2 mg/dL 1.3  1.5  1.1   Alkaline Phos 38 - 126 U/L 127  78  137   AST  15 - 41 U/L _2 ALT 0 - 44 U/L _3 DIAGNOSTIC IMAGING:  I have independently reviewed the scans and discussed with the patient. No results found.   ASSESSMENT:  1.   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  Possible mild nodular infiltration in the left upper quadrant mesentery and anterior omentum.  Multiple small mesenteric root nodes. -PET scan on 09/05/2019 showed 4.2 cm right anterior mesenteric mass with punctate calcifications, SUV 7.2.  Moderate ascites present.  No definite retroperitoneal adenopathy.  Liver is cirrhotic with spleen enlarged.  Diffusely elevated hepatic activity which is nonspecific. -CT-guided biopsy of the mesenteric mass on 10/09/2019 at [facility] consistent with well-differentiated neuroendocrine tumor (carcinoid tumor).  Tumor cells are strongly positive for chromogranin and synaptophysin.  Negative for CK7, CK20, TTF-1, PSA, NKX3.1, S100, HMB-45, and CD56. -Gallium dotatate PET CT scan on 11/08/2019 at [facility] shows large solid mesenteric midline abdomen mass with increased activity measuring 6.5 cm, SUV of 70.  In the uncinate process there is an area of increased activity visualized since seen on series 603 image 91 measuring 13.7 SUV.  Increased activity throughout the liver and spleen limiting evaluation for metastatic disease.  Hepatic activity is heterogeneous. -Lanreotide started on 11/13/2019. - CT CAP (07/11/2021): 4.6 cm soft tissue mass along the mesenteric root.  No significant abdominal adenopathy.  Small volume ascites.  Cirrhosis.   2.  Cirrhosis and ascites: -Thought to be secondary to hepatitis C. -Last paracentesis on 08/21/2019 with 4.8 L removed.   PLAN:  1.  Well differentiated neuroendocrine tumor of the mesenteric mass: - CT CAP  (07/11/2021):-We will: 4.6 cm soft tissue mass along the mesenteric root stable.  Small volume ascites. - He has some diarrhea (2-3 soft stools per day) 1 week after each lanreotide injection.  He gets constipated 1 week prior to each injection. - He does not have any symptoms of carcinoid syndrome. - He reports decreased libido.  Will check testosterone level. - Reviewed labs which showed CBC was normal.  Alk phos mildly elevated and rest of LFTs are normal.  Creatinine was normal.  TSH was 4.4.  Last chromogranin was 165. - Continue lanreotide monthly.  RTC 3 months for follow-up.  We will plan to check blood work, tumor marker and CT AP with contrast.   2.  Cirrhosis and ascites: - He is status post completion of sofosbuvir for treatment of hep C.    3.  Fluid retention: - Continue furosemide 40 mg daily and spironolactone 100 mg daily. - He lost another 7 pounds from last visit.   Orders placed this encounter:  No orders of the defined types were placed in this encounter.    Derek Jack, MD [facility] [phone]   I, Thana Ates, am acting as a scribe for Dr. Derek Jack.  I, Derek Jack MD, have reviewed the above documentation for accuracy and completeness, and I agree with the above.

## 2021-10-22 NOTE — Patient Instructions (Addendum)
[facility] at [facility] Discharge Instructions  You were seen and examined today by Dr. Delton Coombes.  Dr. Delton Coombes discussed your most recent lab work and everything looks good. Dr. Delton Coombes is checking a testosterone level today.  You received your injection today.  Follow-up as scheduled in .    Thank you for choosing [facility] [place] at [facility] to provide your oncology and hematology care.  To afford each patient quality time with our provider, please arrive at least 15 minutes before your scheduled appointment time.   If you have a lab appointment with the [facility] please come in thru the Main Entrance and check in at the main information desk.  You need to re-schedule your appointment should you arrive 10 or more minutes late.  We strive to give you quality time with our providers, and arriving late affects you and other patients whose appointments are after yours.  Also, if you no show three or more times for appointments you may be dismissed from the clinic at the providers discretion.     Again, thank you for choosing [facility].  Our hope is that these requests will decrease the amount of time that you wait before being seen by our physicians.       _____________________________________________________________  Should you have questions after your visit to [facility], please contact our office at [phone] and follow the prompts.  Our office hours are 8:00 a.m. and 4:30 p.m. Monday - Friday.  Please note that voicemails left after 4:00 p.m. may not be returned until the following business day.  We are closed weekends and major holidays.  You do have access to a nurse 24-7, just call the main number to the clinic [phone] and do not press any options, hold on the line and a nurse will answer the phone.    For prescription refill requests, have your pharmacy contact our office and allow 72 hours.     Due to Covid, you will need to wear a mask upon entering the hospital. If you do not have a mask, a mask will be given to you at the Main Entrance upon arrival. For doctor visits, patients may have 1 support person age 16 or older with them. For treatment visits, patients can not have anyone with them due to social distancing guidelines and our immunocompromised population.

## 2021-10-22 NOTE — Patient Instructions (Signed)
[facility]  Discharge Instructions: Thank you for choosing [facility] to provide your oncology and hematology care.  If you have a lab appointment with the [facility], please come in thru the Main Entrance and check in at the main information desk.  Wear comfortable clothing and clothing appropriate for easy access to any Portacath or PICC line.   We strive to give you quality time with your provider. You may need to reschedule your appointment if you arrive late (15 or more minutes).  Arriving late affects you and other patients whose appointments are after yours.  Also, if you miss three or more appointments without notifying the office, you may be dismissed from the clinic at the provider's discretion.      For prescription refill requests, have your pharmacy contact our office and allow 72 hours for refills to be completed.    Today you received the following chemotherapy and/or immunotherapy agents Lanreotide      To help prevent nausea and vomiting after your treatment, we encourage you to take your nausea medication as directed.  BELOW ARE SYMPTOMS THAT SHOULD BE REPORTED IMMEDIATELY: *FEVER GREATER THAN 100.4 F (38 C) OR HIGHER *CHILLS OR SWEATING *NAUSEA AND VOMITING THAT IS NOT CONTROLLED WITH YOUR NAUSEA MEDICATION *UNUSUAL SHORTNESS OF BREATH *UNUSUAL BRUISING OR BLEEDING *URINARY PROBLEMS (pain or burning when urinating, or frequent urination) *BOWEL PROBLEMS (unusual diarrhea, constipation, pain near the anus) TENDERNESS IN MOUTH AND THROAT WITH OR WITHOUT PRESENCE OF ULCERS (sore throat, sores in mouth, or a toothache) UNUSUAL RASH, SWELLING OR PAIN  UNUSUAL VAGINAL DISCHARGE OR ITCHING   Items with * indicate a potential emergency and should be followed up as soon as possible or go to the Emergency Department if any problems should occur.  Please show the CHEMOTHERAPY ALERT CARD or IMMUNOTHERAPY ALERT CARD at check-in to the Emergency  Department and triage nurse.  Should you have questions after your visit or need to cancel or reschedule your appointment, please contact [facility] [phone]  and follow the prompts.  Office hours are 8:00 a.m. to 4:30 p.m. Monday - Friday. Please note that voicemails left after 4:00 p.m. may not be returned until the following business day.  We are closed weekends and major holidays. You have access to a nurse at all times for urgent questions. Please call the main number to the clinic [phone] and follow the prompts.  For any non-urgent questions, you may also contact your provider using MyChart. We now offer e-Visits for anyone 65 and older to request care online for non-urgent symptoms. For details visit [email or web address].   Also download the MyChart app! Go to the app store, search "MyChart", open the app, select [facility], and log in with your MyChart username and password.  Masks are optional in the cancer centers. If you would like for your care team to wear a mask while they are taking care of you, please let them know. For doctor visits, patients may have with them one support person who is at least 56 years old. At this time, visitors are not allowed in the infusion area.

## 2021-10-22 NOTE — Progress Notes (Signed)
Nicolas [name] presents today for Lanreotide injection per the provider's orders.  Stable during administration without incident; injection site WNL; see MAR for injection details.  Patient tolerated procedure well and without incident.  No questions or complaints noted at this time.

## 2021-10-23 LAB — CHROMOGRANIN A: Chromogranin A (ng/mL): 217.4 ng/mL — ABNORMAL HIGH (ref 0.0–101.8)

## 2021-10-23 LAB — TESTOSTERONE: Testosterone: 863 ng/dL (ref 264–916)

## 2021-11-19 ENCOUNTER — Inpatient Hospital Stay: Payer: Medicaid - Out of State | Attending: Hematology

## 2021-11-19 VITALS — BP 121/76 | HR 65 | Temp 98.2°F | Resp 20 | Wt 315.1 lb

## 2021-11-19 DIAGNOSIS — C7A098 Malignant carcinoid tumors of other sites: Secondary | ICD-10-CM | POA: Insufficient documentation

## 2021-11-19 DIAGNOSIS — C7A00 Malignant carcinoid tumor of unspecified site: Secondary | ICD-10-CM

## 2021-11-19 MED ORDER — LANREOTIDE ACETATE 120 MG/0.5ML ~~LOC~~ SOLN
120.0000 mg | Freq: Once | SUBCUTANEOUS | Status: AC
  Administered 2021-11-19: 120 mg via SUBCUTANEOUS

## 2021-11-19 NOTE — Patient Instructions (Signed)
[facility]  Discharge Instructions: Thank you for choosing [facility] to provide your oncology and hematology care.  If you have a lab appointment with the [facility], please come in thru the Main Entrance and check in at the main information desk.  Wear comfortable clothing and clothing appropriate for easy access to any Portacath or PICC line.   We strive to give you quality time with your provider. You may need to reschedule your appointment if you arrive late (15 or more minutes).  Arriving late affects you and other patients whose appointments are after yours.  Also, if you miss three or more appointments without notifying the office, you may be dismissed from the clinic at the provider's discretion.      For prescription refill requests, have your pharmacy contact our office and allow 72 hours for refills to be completed.    Today you received the following chemotherapy and/or immunotherapy agents Lanreotide      To help prevent nausea and vomiting after your treatment, we encourage you to take your nausea medication as directed.  BELOW ARE SYMPTOMS THAT SHOULD BE REPORTED IMMEDIATELY: *FEVER GREATER THAN 100.4 F (38 C) OR HIGHER *CHILLS OR SWEATING *NAUSEA AND VOMITING THAT IS NOT CONTROLLED WITH YOUR NAUSEA MEDICATION *UNUSUAL SHORTNESS OF BREATH *UNUSUAL BRUISING OR BLEEDING *URINARY PROBLEMS (pain or burning when urinating, or frequent urination) *BOWEL PROBLEMS (unusual diarrhea, constipation, pain near the anus) TENDERNESS IN MOUTH AND THROAT WITH OR WITHOUT PRESENCE OF ULCERS (sore throat, sores in mouth, or a toothache) UNUSUAL RASH, SWELLING OR PAIN  UNUSUAL VAGINAL DISCHARGE OR ITCHING   Items with * indicate a potential emergency and should be followed up as soon as possible or go to the Emergency Department if any problems should occur.  Please show the CHEMOTHERAPY ALERT CARD or IMMUNOTHERAPY ALERT CARD at check-in to the  Emergency Department and triage nurse.  Should you have questions after your visit or need to cancel or reschedule your appointment, please contact [facility] [phone]  and follow the prompts.  Office hours are 8:00 a.m. to 4:30 p.m. Monday - Friday. Please note that voicemails left after 4:00 p.m. may not be returned until the following business day.  We are closed weekends and major holidays. You have access to a nurse at all times for urgent questions. Please call the main number to the clinic [phone] and follow the prompts.  For any non-urgent questions, you may also contact your provider using MyChart. We now offer e-Visits for anyone 75 and older to request care online for non-urgent symptoms. For details visit [email or web address].   Also download the MyChart app! Go to the app store, search "MyChart", open the app, select [facility], and log in with your MyChart username and password.  Masks are optional in the cancer centers. If you would like for your care team to wear a mask while they are taking care of you, please let them know. For doctor visits, patients may have with them one support person who is at least 56 years old. At this time, visitors are not allowed in the infusion area.

## 2021-11-19 NOTE — Progress Notes (Signed)
Patient presents today for Lanreotide injection per providers order.  Vital signs within parameters.  Patient has no new complaints at this time.  Stable during administration without incident; injection site WNL; see MAR for injection details.  Patient tolerated procedure well and without incident.  No questions or complaints noted at this time.

## 2021-12-17 ENCOUNTER — Inpatient Hospital Stay: Payer: Medicaid Other | Attending: Hematology

## 2021-12-17 VITALS — BP 125/83 | HR 75 | Temp 96.9°F | Resp 20 | Wt 312.2 lb

## 2021-12-17 DIAGNOSIS — C7A098 Malignant carcinoid tumors of other sites: Secondary | ICD-10-CM | POA: Insufficient documentation

## 2021-12-17 DIAGNOSIS — C7A00 Malignant carcinoid tumor of unspecified site: Secondary | ICD-10-CM

## 2021-12-17 MED ORDER — LANREOTIDE ACETATE 120 MG/0.5ML ~~LOC~~ SOLN
120.0000 mg | Freq: Once | SUBCUTANEOUS | Status: AC
  Administered 2021-12-17: 120 mg via SUBCUTANEOUS

## 2021-12-17 NOTE — Patient Instructions (Signed)
[facility]  Discharge Instructions: Thank you for choosing [facility] to provide your oncology and hematology care.  If you have a lab appointment with the [facility], please come in thru the Main Entrance and check in at the main information desk.  Wear comfortable clothing and clothing appropriate for easy access to any Portacath or PICC line.   We strive to give you quality time with your provider. You may need to reschedule your appointment if you arrive late (15 or more minutes).  Arriving late affects you and other patients whose appointments are after yours.  Also, if you miss three or more appointments without notifying the office, you may be dismissed from the clinic at the provider's discretion.      For prescription refill requests, have your pharmacy contact our office and allow 72 hours for refills to be completed.    Today you received the following chemotherapy and/or immunotherapy agents lanreotide.   Lanreotide Injection What is this medication? LANREOTIDE (lan REE oh tide) treats high levels of growth hormone (acromegaly). It is used when other therapies have not worked well enough or cannot be tolerated. It works by reducing the amount of growth hormone your body makes. This reduces symptoms and the risk of health problems caused by too much growth hormone, such as diabetes and heart disease. It may also be used to treat neuroendocrine tumors, a cancer of the cells that release hormones and other substances in your body. It works by slowing down the release of these substances from the cells. This slows tumor growth. It also decreases the symptoms of carcinoid syndrome, such as flushing or diarrhea. This medicine may be used for other purposes; ask your health care provider or pharmacist if you have questions. COMMON BRAND NAME(S): Somatuline [place] What should I tell my care team before I take this medication? They need to know if you have  any of these conditions: Diabetes Gallbladder disease Heart disease Kidney disease Liver disease Thyroid disease An unusual or allergic reaction to lanreotide, other medications, foods, dyes, or preservatives Pregnant or trying to get pregnant Breast-feeding How should I use this medication? This medication is injected under the skin. It is given by your care team in a hospital or clinic setting. Talk to your care team about the use of this medication in children. Special care may be needed. Overdosage: If you think you have taken too much of this medicine contact a poison control center or emergency room at once. NOTE: This medicine is only for you. Do not share this medicine with others. What if I miss a dose? Keep appointments for follow-up doses. It is important not to miss your dose. Call your care team if you are unable to keep an appointment. What may interact with this medication? Bromocriptine Cyclosporine Certain medications for blood pressure, heart disease, irregular heartbeat Certain medications for diabetes Quinidine Terfenadine This list may not describe all possible interactions. Give your health care provider a list of all the medicines, herbs, non-prescription drugs, or dietary supplements you use. Also tell them if you smoke, drink alcohol, or use illegal drugs. Some items may interact with your medicine. What should I watch for while using this medication? Visit your care team for regular checks on your progress. Tell your care team if your symptoms do not start to get better or if they get worse. Your condition will be monitored carefully while you are receiving this medication. You may need blood work  while you are taking this medication. This medication may increase blood sugar. The risk may be higher in patients who already have diabetes. Ask your care team what you can do to lower your risk of diabetes while taking this medication. Talk to your care team if you  wish to become pregnant or think you may be pregnant. This medication can cause serious birth defects. Do not breast-feed while taking this medication and for 6 months after stopping therapy. This medication may cause infertility. Talk to your care team if you are concerned about your fertility. What side effects may I notice from receiving this medication? Side effects that you should report to your care team as soon as possible: Allergic reactions--skin rash, itching, hives, swelling of the face, lips, tongue, or throat Gallbladder problems--severe stomach pain, nausea, vomiting, fever High blood sugar (hyperglycemia)--increased thirst or amount of urine, unusual weakness or fatigue, blurry vision Increase in blood pressure Low blood sugar (hypoglycemia)--tremors or shaking, anxiety, sweating, cold or clammy skin, confusion, dizziness, rapid heartbeat Low thyroid levels (hypothyroidism)--unusual weakness or fatigue, increased sensitivity to cold, constipation, hair loss, dry skin, weight gain, feelings of depression Slow heartbeat--dizziness, feeling faint or lightheaded, confusion, trouble breathing, unusual weakness or fatigue Side effects that usually do not require medical attention (report to your care team if they continue or are bothersome): Diarrhea Dizziness Headache Muscle spasms Nausea Pain, redness, irritation, or bruising at the injection site Stomach pain This list may not describe all possible side effects. Call your doctor for medical advice about side effects. You may report side effects to FDA at 1-800-FDA-1088. Where should I keep my medication? This medication is given in a hospital or clinic. It will not be stored at home. NOTE: This sheet is a summary. It may not cover all possible information. If you have questions about this medicine, talk to your doctor, pharmacist, or health care provider.  2023 Elsevier/Gold Standard (2021-06-18 00:00:00)       To help prevent  nausea and vomiting after your treatment, we encourage you to take your nausea medication as directed.  BELOW ARE SYMPTOMS THAT SHOULD BE REPORTED IMMEDIATELY: *FEVER GREATER THAN 100.4 F (38 C) OR HIGHER *CHILLS OR SWEATING *NAUSEA AND VOMITING THAT IS NOT CONTROLLED WITH YOUR NAUSEA MEDICATION *UNUSUAL SHORTNESS OF BREATH *UNUSUAL BRUISING OR BLEEDING *URINARY PROBLEMS (pain or burning when urinating, or frequent urination) *BOWEL PROBLEMS (unusual diarrhea, constipation, pain near the anus) TENDERNESS IN MOUTH AND THROAT WITH OR WITHOUT PRESENCE OF ULCERS (sore throat, sores in mouth, or a toothache) UNUSUAL RASH, SWELLING OR PAIN  UNUSUAL VAGINAL DISCHARGE OR ITCHING   Items with * indicate a potential emergency and should be followed up as soon as possible or go to the Emergency Department if any problems should occur.  Please show the CHEMOTHERAPY ALERT CARD or IMMUNOTHERAPY ALERT CARD at check-in to the Emergency Department and triage nurse.  Should you have questions after your visit or need to cancel or reschedule your appointment, please contact [facility] [phone]  and follow the prompts.  Office hours are 8:00 a.m. to 4:30 p.m. Monday - Friday. Please note that voicemails left after 4:00 p.m. may not be returned until the following business day.  We are closed weekends and major holidays. You have access to a nurse at all times for urgent questions. Please call the main number to the clinic [phone] and follow the prompts.  For any non-urgent questions, you may also contact your provider using MyChart. We  now offer e-Visits for anyone 101 and older to request care online for non-urgent symptoms. For details visit [email or web address].   Also download the MyChart app! Go to the app store, search "MyChart", open the app, select [facility], and log in with your MyChart username and password.  Masks are optional in the cancer centers. If you would  like for your care team to wear a mask while they are taking care of you, please let them know. You may have one support person who is at least 56 years old accompany you for your appointments.

## 2021-12-17 NOTE — Progress Notes (Signed)
Patient tolerated Lanreotide injection with no complaints voiced.  Patient stable during and after injection.  See MAR for details.  Site clean and dry with no bruising or swelling noted at site.  Band aid applied.  Vss with discharge and left in satisfactory condition with no s/s of distress noted.   

## 2022-01-13 ENCOUNTER — Other Ambulatory Visit: Payer: Self-pay

## 2022-01-13 DIAGNOSIS — C7A00 Malignant carcinoid tumor of unspecified site: Secondary | ICD-10-CM

## 2022-01-14 ENCOUNTER — Inpatient Hospital Stay: Payer: Medicaid Other

## 2022-01-14 ENCOUNTER — Inpatient Hospital Stay: Payer: Medicaid Other | Attending: Hematology | Admitting: Hematology

## 2022-01-14 ENCOUNTER — Encounter: Payer: Self-pay | Admitting: Hematology

## 2022-01-14 ENCOUNTER — Encounter (HOSPITAL_COMMUNITY): Payer: Self-pay | Admitting: Hematology

## 2022-01-14 VITALS — BP 123/87 | Temp 98.6°F | Resp 18 | Wt 310.3 lb

## 2022-01-14 DIAGNOSIS — C7A098 Malignant carcinoid tumors of other sites: Secondary | ICD-10-CM | POA: Insufficient documentation

## 2022-01-14 DIAGNOSIS — C7A00 Malignant carcinoid tumor of unspecified site: Secondary | ICD-10-CM

## 2022-01-14 DIAGNOSIS — R197 Diarrhea, unspecified: Secondary | ICD-10-CM | POA: Diagnosis not present

## 2022-01-14 DIAGNOSIS — G8929 Other chronic pain: Secondary | ICD-10-CM | POA: Diagnosis not present

## 2022-01-14 DIAGNOSIS — K7460 Unspecified cirrhosis of liver: Secondary | ICD-10-CM | POA: Insufficient documentation

## 2022-01-14 DIAGNOSIS — M549 Dorsalgia, unspecified: Secondary | ICD-10-CM | POA: Insufficient documentation

## 2022-01-14 LAB — CBC WITH DIFFERENTIAL/PLATELET
Abs Immature Granulocytes: 0.02 10*3/uL (ref 0.00–0.07)
Basophils Absolute: 0 10*3/uL (ref 0.0–0.1)
Basophils Relative: 1 %
Eosinophils Absolute: 0.3 10*3/uL (ref 0.0–0.5)
Eosinophils Relative: 6 %
HCT: 42.6 % (ref 39.0–52.0)
Hemoglobin: 14.2 g/dL (ref 13.0–17.0)
Immature Granulocytes: 0 %
Lymphocytes Relative: 31 %
Lymphs Abs: 1.6 10*3/uL (ref 0.7–4.0)
MCH: 32.6 pg (ref 26.0–34.0)
MCHC: 33.3 g/dL (ref 30.0–36.0)
MCV: 97.9 fL (ref 80.0–100.0)
Monocytes Absolute: 0.5 10*3/uL (ref 0.1–1.0)
Monocytes Relative: 9 %
Neutro Abs: 2.7 10*3/uL (ref 1.7–7.7)
Neutrophils Relative %: 53 %
Platelets: 127 10*3/uL — ABNORMAL LOW (ref 150–400)
RBC: 4.35 MIL/uL (ref 4.22–5.81)
RDW: 13.5 % (ref 11.5–15.5)
WBC: 5.1 10*3/uL (ref 4.0–10.5)
nRBC: 0 % (ref 0.0–0.2)

## 2022-01-14 LAB — COMPREHENSIVE METABOLIC PANEL
ALT: 18 U/L (ref 0–44)
AST: 28 U/L (ref 15–41)
Albumin: 2.9 g/dL — ABNORMAL LOW (ref 3.5–5.0)
Alkaline Phosphatase: 90 U/L (ref 38–126)
Anion gap: 8 (ref 5–15)
BUN: 16 mg/dL (ref 6–20)
CO2: 27 mmol/L (ref 22–32)
Calcium: 8.7 mg/dL — ABNORMAL LOW (ref 8.9–10.3)
Chloride: 104 mmol/L (ref 98–111)
Creatinine, Ser: 1.1 mg/dL (ref 0.61–1.24)
GFR, Estimated: >60 mL/min (ref >60)
Glucose, Bld: 116 mg/dL — ABNORMAL HIGH (ref 70–99)
Potassium: 3.9 mmol/L (ref 3.5–5.1)
Sodium: 139 mmol/L (ref 135–145)
Total Bilirubin: 1.1 mg/dL (ref 0.3–1.2)
Total Protein: 7.8 g/dL (ref 6.5–8.1)

## 2022-01-14 MED ORDER — LANREOTIDE ACETATE 120 MG/0.5ML ~~LOC~~ SOLN
120.0000 mg | Freq: Once | SUBCUTANEOUS | Status: AC
  Administered 2022-01-14: 120 mg via SUBCUTANEOUS

## 2022-01-14 MED ORDER — LANREOTIDE ACETATE 120 MG/0.5ML ~~LOC~~ SOLN
SUBCUTANEOUS | Status: AC
  Filled 2022-01-14: qty 120

## 2022-01-14 NOTE — Patient Instructions (Addendum)
Nicolas [name]  Discharge Instructions  You were seen and examined today by Dr. Delton Coombes.  Dr. Delton Coombes discussed your most recent lab work which revealed that everything looks good.  Follow-up as scheduled in 2 months.    Thank you for choosing [facility] to provide your oncology and hematology care.   To afford each patient quality time with our provider, please arrive at least 15 minutes before your scheduled appointment time. You may need to reschedule your appointment if you arrive late (10 or more minutes). Arriving late affects you and other patients whose appointments are after yours.  Also, if you miss three or more appointments without notifying the office, you may be dismissed from the clinic at the provider's discretion.    Again, thank you for choosing [facility].  Our hope is that these requests will decrease the amount of time that you wait before being seen by our physicians.   If you have a lab appointment with the [facility] please come in thru the Main Entrance and check in at the main information desk.           _____________________________________________________________  Should you have questions after your visit to [facility], please contact our office at [phone] and follow the prompts.  Our office hours are 8:00 a.m. to 4:30 p.m. Monday - Thursday and 8:00 a.m. to 2:30 p.m. Friday.  Please note that voicemails left after 4:00 p.m. may not be returned until the following business day.  We are closed weekends and all major holidays.  You do have access to a nurse 24-7, just call the main number to the clinic [phone] and do not press any options, hold on the line and a nurse will answer the phone.    For prescription refill requests, have your pharmacy contact our office and allow 72 hours.    Masks are optional in the cancer centers. If you would like for your care team  to wear a mask while they are taking care of you, please let them know. You may have one support person who is at least 56 years old accompany you for your appointments.

## 2022-01-14 NOTE — Progress Notes (Signed)
[place], [place] [address]   CLINIC:  Medical Oncology/Hematology  PCP:  Olin Hauser, [place] / [place] [address] [phone]   REASON FOR VISIT:  Follow-up for neuroendocrine tumor, on lanreotid  PRIOR THERAPY: none  NGS Results: not done  CURRENT THERAPY: Lanreotide monthly  BRIEF ONCOLOGIC HISTORY:  Oncology History   No history exists.    CANCER STAGING:  Cancer Staging  No matching staging information was found for the patient.  INTERVAL HISTORY:  Mr. Eleanor Dimichele, a 56 y.o. male, seen for follow-up of malignant carcinoid tumor.  Denies any flushing, wheezing or diarrhea.  He usually gets diarrhea for a week after each lanreotide injection followed by 2 weeks of regular bowel movements.  1 week prior to his next injection, he gets constipated.  Chronic back pain is stable.  Energy levels are 50%.  Weight has been stable.  REVIEW OF SYSTEMS:  Review of Systems  Gastrointestinal:  Positive for constipation and diarrhea.  All other systems reviewed and are negative.   PAST MEDICAL/SURGICAL HISTORY:  Past Medical History:  Diagnosis Date   Cirrhosis ([place])    Hepatitis C    Ab + 07/28/19. RNA 436,000   HTN (hypertension)    Past Surgical History:  Procedure Laterality Date   debridement  Right    per pt, debridement of rt hand    SOCIAL HISTORY:  Social History   Socioeconomic History   Marital status: Divorced    Spouse name: Not on file   Number of children: 4   Years of education: Not on file   Highest education level: Not on file  Occupational History   Occupation: unemployed  Tobacco Use   Smoking status: Never   Smokeless tobacco: Current    Types: Snuff   Tobacco comments:    started using snuff since 1986  Vaping Use   Vaping Use: Never used  Substance and Sexual Activity   Alcohol use: Not Currently    Comment: occasional   Drug use: Never   Sexual activity: Not  Currently  Other Topics Concern   Not on file  Social History Narrative   Not on file   Social Determinants of Health   Financial Resource Strain: Low Risk  (08/02/2019)   Overall Financial Resource Strain (CARDIA)    Difficulty of Paying Living Expenses: Not very hard  Food Insecurity: No Food Insecurity (08/02/2019)   Hunger Vital Sign    Worried About Running Out of Food in the Last Year: Never true    Ran Out of Food in the Last Year: Never true  Transportation Needs: Unmet Transportation Needs (08/02/2019)   PRAPARE - Transportation    Lack of Transportation (Medical): Yes    Lack of Transportation (Non-Medical): Yes  Physical Activity: Inactive (08/02/2019)   Exercise Vital Sign    Days of Exercise per Week: 0 days    Minutes of Exercise per Session: 0 min  Stress: Stress Concern Present (08/02/2019)   [place]    Feeling of Stress : To some extent  Social Connections: Socially Isolated (08/02/2019)   Social Connection and Isolation Panel [NHANES]    Frequency of Communication with Friends and Family: Three times a week    Frequency of Social Gatherings with Friends and Family: Three times a week    Attends Religious Services: Never    Active Member of Clubs or Organizations: No  Attends Archivist Meetings: Never    Marital Status: Divorced  Human resources officer Violence: Not At Risk (08/02/2019)   Humiliation, Afraid, Rape, and Kick questionnaire    Fear of Current or Ex-Partner: No    Emotionally Abused: No    Physically Abused: No    Sexually Abused: No    FAMILY HISTORY:  Family History  Problem Relation Age of Onset   Cirrhosis Sister        per patient, due to drug use.    Alzheimer's disease Mother    Cancer Mother    Diabetes Mother    Congestive Heart Failure Father    Diabetes Father    Alzheimer's disease Paternal Grandmother    Osteoporosis Paternal Grandmother    Diabetes  Daughter    Colon cancer Neg Hx     CURRENT MEDICATIONS:  Current Outpatient Medications  Medication Sig Dispense Refill   clotrimazole (LOTRIMIN) 1 % cream Apply 1 application topically 2 (two) times daily. 30 g 3   diphenoxylate-atropine (LOMOTIL) 2.5-0.025 MG tablet Take 2 tablets after first watery stool, then 1 tablet after each watery stool.  No more than 8 tablets daily. 60 tablet 3   diphenoxylate-atropine (LOMOTIL) 2.5-0.025 MG tablet 2 tab(s)     Ensure (ENSURE) Take 237 mLs by mouth every evening.     furosemide (LASIX) 40 MG tablet TAKE 1 TABLET BY MOUTH EVERY DAY 30 tablet 3   Hepatitis A-Hep B Recomb Vac (TWINRIX) 720-20 ELU-MCG/ML injection Inject into the muscle. Pt has last shot in July     pantoprazole (PROTONIX) 40 MG tablet Take 1 tablet (40 mg total) by mouth daily. 30 tablet 3   propranolol (INDERAL) 20 MG tablet Take 1 tablet by mouth daily.     sertraline (ZOLOFT) 50 MG tablet Take 1 tablet (50 mg total) by mouth daily. 60 tablet 0   sildenafil (VIAGRA) 50 MG tablet Take by mouth.     sildenafil (VIAGRA) 50 MG tablet Take by mouth.     spironolactone (ALDACTONE) 100 MG tablet 1 tab(s)     spironolactone (ALDACTONE) 100 MG tablet Take one tablet in AM 60 tablet 2   No current facility-administered medications for this visit.   Facility-Administered Medications Ordered in Other Visits  Medication Dose Route Frequency Provider Last Rate Last Admin   epoetin alfa-epbx (RETACRIT) 37106 UNIT/ML injection            epoetin alfa-epbx (RETACRIT) 26948 UNIT/ML injection            lanreotide acetate (SOMATULINE DEPOT) 120 MG/0.5ML injection             ALLERGIES:  No Known Allergies  PHYSICAL EXAM:  Performance status (ECOG): 1 - Symptomatic but completely ambulatory  There were no vitals filed for this visit.  Wt Readings from Last 3 Encounters:  12/17/21 (!) 312 lb 2.7 oz (141.6 kg)  11/19/21 (!) 315 lb 1.6 oz (142.9 kg)  10/22/21 (!) 310 lb 6.5 oz (140.8 kg)    Physical Exam Vitals reviewed.  Constitutional:      Appearance: Normal appearance. He is obese.  Cardiovascular:     Rate and Rhythm: Normal rate and regular rhythm.     Pulses: Normal pulses.     Heart sounds: Normal heart sounds.  Pulmonary:     Effort: Pulmonary effort is normal.     Breath sounds: Normal breath sounds.  Neurological:     General: No focal deficit present.     Mental  Status: He is alert and oriented to person, place, and time.  Psychiatric:        Mood and Affect: Mood normal.        Behavior: Behavior normal.      LABORATORY DATA:  I have reviewed the labs as listed.     Latest Ref Rng & Units 01/14/2022   12:22 PM 10/22/2021   11:46 AM 07/23/2021   12:30 PM  CBC  WBC 4.0 - 10.5 K/uL 5.1  6.3  5.2   Hemoglobin 13.0 - 17.0 g/dL 14.2  14.1  14.2   Hematocrit 39.0 - 52.0 % 42.6  41.7  42.5   Platelets 150 - 400 K/uL 127  145  127       Latest Ref Rng & Units 01/14/2022   12:22 PM 10/22/2021   11:46 AM 07/23/2021   12:30 PM  CMP  Glucose 70 - 99 mg/dL 116  103  112   BUN 6 - 20 mg/dL '16  18  16   '$ Creatinine 0.61 - 1.24 mg/dL 1.10  1.12  1.15   Sodium 135 - 145 mmol/L 139  136  135   Potassium 3.5 - 5.1 mmol/L 3.9  4.3  4.0   Chloride 98 - 111 mmol/L 104  105  104   CO2 22 - 32 mmol/L '27  25  26   '$ Calcium 8.9 - 10.3 mg/dL 8.7  8.4  8.6   Total Protein 6.5 - 8.1 g/dL 7.8  8.1  7.5   Total Bilirubin 0.3 - 1.2 mg/dL 1.1  1.3  1.5   Alkaline Phos 38 - 126 U/L 90  127  78   AST 15 - 41 U/L '28  31  29   '$ ALT 0 - 44 U/L '18  24  20     '$ DIAGNOSTIC IMAGING:  I have independently reviewed the scans and discussed with the patient. No results found.   ASSESSMENT:  1.   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  Possible mild nodular infiltration in the left upper quadrant mesentery and  anterior omentum.  Multiple small mesenteric root nodes. -PET scan on 09/05/2019 showed 4.2 cm right anterior mesenteric mass with punctate calcifications, SUV 7.2.  Moderate ascites present.  No definite retroperitoneal adenopathy.  Liver is cirrhotic with spleen enlarged.  Diffusely elevated hepatic activity which is nonspecific. -CT-guided biopsy of the mesenteric mass on 10/09/2019 at [facility] consistent with well-differentiated neuroendocrine tumor (carcinoid tumor).  Tumor cells are strongly positive for chromogranin and synaptophysin.  Negative for CK7, CK20, TTF-1, PSA, NKX3.1, S100, HMB-45, and CD56. -Gallium dotatate PET CT scan on 11/08/2019 at [facility] shows large solid mesenteric midline abdomen mass with increased activity measuring 6.5 cm, SUV of 70.  In the uncinate process there is an area of increased activity visualized since seen on series 603 image 91 measuring 13.7 SUV.  Increased activity throughout the liver and spleen limiting evaluation for metastatic disease.  Hepatic activity is heterogeneous. -Lanreotide started on 11/13/2019. - CT CAP (07/11/2021): 4.6 cm soft tissue mass along the mesenteric root.  No significant abdominal adenopathy.  Small volume ascites.  Cirrhosis.   2.  Cirrhosis and ascites: -Thought to be secondary to hepatitis C. -Last paracentesis on 08/21/2019 with 4.8 L removed.   PLAN:  1.  Well differentiated neuroendocrine tumor of the mesenteric mass: -  CT CAP (07/11/2021): 4.6 cm soft tissue mass along the mesenteric root is stable.  Small volume ascites. - He has diarrhea for 1 week after each lanreotide injection followed by 2 weeks of normal bowel movements.  He gets constipated 1 week prior to each lanreotide injection. - He does not have any symptoms of carcinoid syndrome. - We reviewed his TSH and testosterone levels which were normal. - Labs from today show CBC was grossly normal with mild thrombocytopenia which is stable.   LFTs and creatinine were within normal limits.  Chromogranin was stable between 1 50-200. - Recommend continuing lanreotide monthly.  I plan to arrange for a CT AP with contrast at [facility].  RTC after the scan.   2.  Cirrhosis and ascites: - Labs today shows LFTs are normal with low albumin.  He is status post completion of hepatitis C treatment with sofosbuvir.    3.  Fluid retention: - Continue furosemide 40 mg daily and spironolactone 100 mg daily. - Weight has been stable in the last 2 months.   Orders placed this encounter:  No orders of the defined types were placed in this encounter.    Derek Jack, MD [place] [phone]

## 2022-01-15 LAB — CHROMOGRANIN A: Chromogranin A (ng/mL): 185.9 ng/mL — ABNORMAL HIGH (ref 0.0–101.8)

## 2022-01-21 IMAGING — US US PARACENTESIS
1 series · 2 of 2 positions shown · non-contrast
Comparison: none

INDICATION: Cirrhosis, ascites

[Series 1: us paracentesis · 2 of 2 slices shown]
[im 1/2]
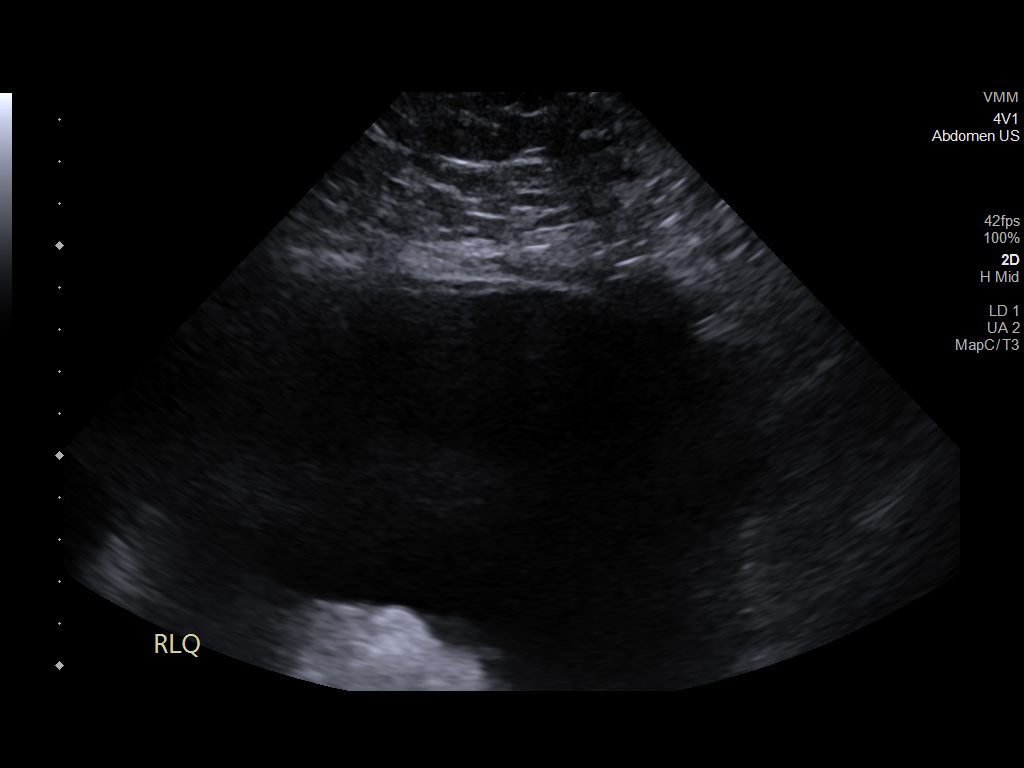
[im 2/2]
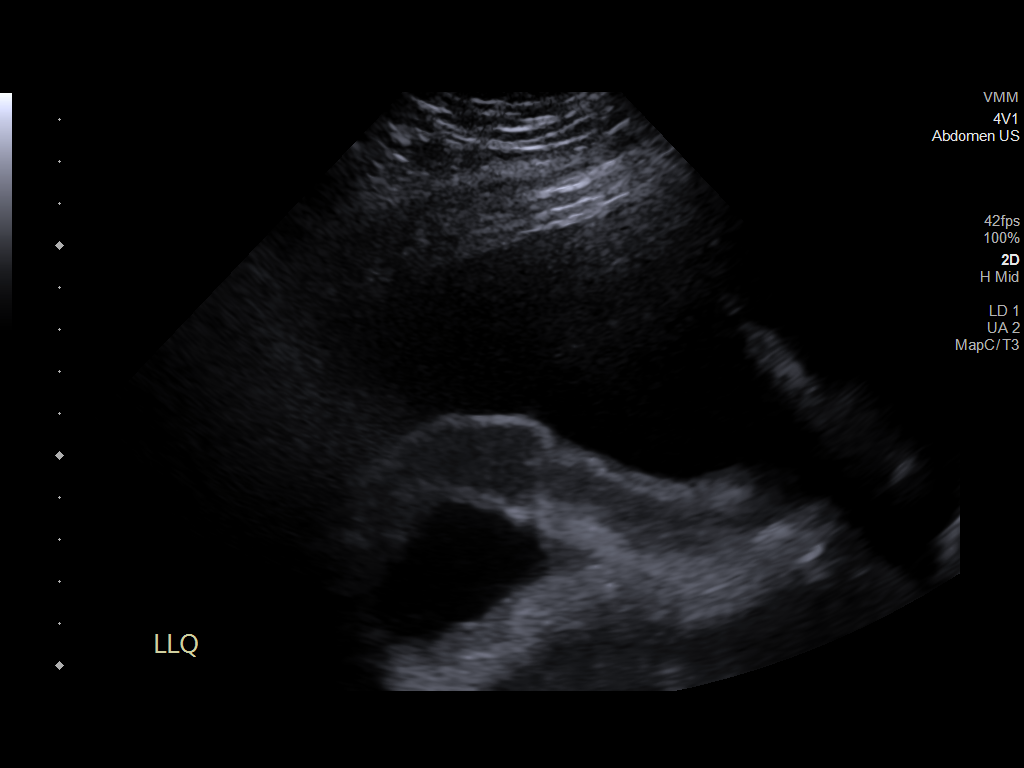

[2 of 2 positions shown; findings below may reference images not displayed]

EXAM:
ULTRASOUND GUIDED DIAGNOSTIC AND THERAPEUTIC PARACENTESIS

MEDICATIONS:
None

COMPLICATIONS:
None immediate

PROCEDURE:
Informed written consent was obtained from the patient after a
discussion of the risks, benefits and alternatives to treatment. A
timeout was performed prior to the initiation of the procedure.

Initial ultrasound scanning demonstrates a large amount of ascites
within the right lower abdominal quadrant. The right lower abdomen
was prepped and draped in the usual sterile fashion. 1% lidocaine
was used for local anesthesia.

Following this, a 5 French Yueh catheter was introduced. An
ultrasound image was saved for documentation purposes. The
paracentesis was performed. The catheter was removed and a dressing
was applied. The patient tolerated the procedure well without
immediate post procedural complication.
Patient received post-procedure intravenous albumin; see nursing
notes for details.
FINDINGS: A total of approximately 4.8 L of cloudy yellow ascitic fluid was
removed. Samples were sent to the laboratory as requested by the
clinical team.
IMPRESSION: Successful ultrasound-guided paracentesis yielding 4.8 liters of
peritoneal fluid.

## 2022-01-26 ENCOUNTER — Other Ambulatory Visit: Payer: Self-pay

## 2022-01-26 DIAGNOSIS — C7A00 Malignant carcinoid tumor of unspecified site: Secondary | ICD-10-CM

## 2022-02-11 ENCOUNTER — Inpatient Hospital Stay: Payer: Medicaid Other | Attending: Hematology

## 2022-02-11 VITALS — BP 124/76 | HR 68 | Temp 97.0°F | Resp 18

## 2022-02-11 DIAGNOSIS — K7460 Unspecified cirrhosis of liver: Secondary | ICD-10-CM | POA: Insufficient documentation

## 2022-02-11 DIAGNOSIS — C7A098 Malignant carcinoid tumors of other sites: Secondary | ICD-10-CM | POA: Insufficient documentation

## 2022-02-11 DIAGNOSIS — R188 Other ascites: Secondary | ICD-10-CM | POA: Diagnosis not present

## 2022-02-11 DIAGNOSIS — C7A00 Malignant carcinoid tumor of unspecified site: Secondary | ICD-10-CM

## 2022-02-11 MED ORDER — LANREOTIDE ACETATE 120 MG/0.5ML ~~LOC~~ SOLN
120.0000 mg | Freq: Once | SUBCUTANEOUS | Status: AC
  Administered 2022-02-11: 120 mg via SUBCUTANEOUS

## 2022-02-11 NOTE — Progress Notes (Signed)
Patient tolerated injection with no complaints voiced. Site clean and dry with no bruising or swelling noted at site. See MAR for details. Band aid applied.  Patient stable during and after injection. VSS with discharge and left in satisfactory condition with no s/s of distress noted.  

## 2022-02-11 NOTE — Patient Instructions (Signed)
[facility]  Discharge Instructions: Thank you for choosing [facility] to provide your oncology and hematology care.  If you have a lab appointment with the [facility], please come in thru the Main Entrance and check in at the main information desk.  Wear comfortable clothing and clothing appropriate for easy access to any Portacath or PICC line.   We strive to give you quality time with your provider. You may need to reschedule your appointment if you arrive late (15 or more minutes).  Arriving late affects you and other patients whose appointments are after yours.  Also, if you miss three or more appointments without notifying the office, you may be dismissed from the clinic at the provider's discretion.      For prescription refill requests, have your pharmacy contact our office and allow 72 hours for refills to be completed.    Today you received the following chemotherapy and/or immunotherapy agents Lanreotide, return as scheduled.   To help prevent nausea and vomiting after your treatment, we encourage you to take your nausea medication as directed.  BELOW ARE SYMPTOMS THAT SHOULD BE REPORTED IMMEDIATELY: *FEVER GREATER THAN 100.4 F (38 C) OR HIGHER *CHILLS OR SWEATING *NAUSEA AND VOMITING THAT IS NOT CONTROLLED WITH YOUR NAUSEA MEDICATION *UNUSUAL SHORTNESS OF BREATH *UNUSUAL BRUISING OR BLEEDING *URINARY PROBLEMS (pain or burning when urinating, or frequent urination) *BOWEL PROBLEMS (unusual diarrhea, constipation, pain near the anus) TENDERNESS IN MOUTH AND THROAT WITH OR WITHOUT PRESENCE OF ULCERS (sore throat, sores in mouth, or a toothache) UNUSUAL RASH, SWELLING OR PAIN  UNUSUAL VAGINAL DISCHARGE OR ITCHING   Items with * indicate a potential emergency and should be followed up as soon as possible or go to the Emergency Department if any problems should occur.  Please show the CHEMOTHERAPY ALERT CARD or IMMUNOTHERAPY ALERT CARD at  check-in to the Emergency Department and triage nurse.  Should you have questions after your visit or need to cancel or reschedule your appointment, please contact [facility] [phone]  and follow the prompts.  Office hours are 8:00 a.m. to 4:30 p.m. Monday - Friday. Please note that voicemails left after 4:00 p.m. may not be returned until the following business day.  We are closed weekends and major holidays. You have access to a nurse at all times for urgent questions. Please call the main number to the clinic [phone] and follow the prompts.  For any non-urgent questions, you may also contact your provider using MyChart. We now offer e-Visits for anyone 66 and older to request care online for non-urgent symptoms. For details visit [email or web address].   Also download the MyChart app! Go to the app store, search "MyChart", open the app, select [facility], and log in with your MyChart username and password.  Masks are optional in the cancer centers. If you would like for your care team to wear a mask while they are taking care of you, please let them know. You may have one support person who is at least 57 years old accompany you for your appointments.

## 2022-03-11 ENCOUNTER — Inpatient Hospital Stay: Payer: Medicaid Other

## 2022-03-11 ENCOUNTER — Inpatient Hospital Stay (HOSPITAL_BASED_OUTPATIENT_CLINIC_OR_DEPARTMENT_OTHER): Payer: Medicaid Other | Admitting: Hematology

## 2022-03-11 VITALS — BP 133/83 | HR 72 | Temp 98.2°F | Resp 18 | Wt 314.6 lb

## 2022-03-11 DIAGNOSIS — C7A00 Malignant carcinoid tumor of unspecified site: Secondary | ICD-10-CM

## 2022-03-11 DIAGNOSIS — C7A098 Malignant carcinoid tumors of other sites: Secondary | ICD-10-CM | POA: Diagnosis not present

## 2022-03-11 LAB — COMPREHENSIVE METABOLIC PANEL
ALT: 17 U/L (ref 0–44)
AST: 31 U/L (ref 15–41)
Albumin: 3.1 g/dL — ABNORMAL LOW (ref 3.5–5.0)
Alkaline Phosphatase: 102 U/L (ref 38–126)
Anion gap: 8 (ref 5–15)
BUN: 22 mg/dL — ABNORMAL HIGH (ref 6–20)
CO2: 27 mmol/L (ref 22–32)
Calcium: 8.7 mg/dL — ABNORMAL LOW (ref 8.9–10.3)
Chloride: 103 mmol/L (ref 98–111)
Creatinine, Ser: 1.14 mg/dL (ref 0.61–1.24)
GFR, Estimated: >60 mL/min (ref >60)
Glucose, Bld: 118 mg/dL — ABNORMAL HIGH (ref 70–99)
Potassium: 3.9 mmol/L (ref 3.5–5.1)
Sodium: 138 mmol/L (ref 135–145)
Total Bilirubin: 0.9 mg/dL (ref 0.3–1.2)
Total Protein: 8.3 g/dL — ABNORMAL HIGH (ref 6.5–8.1)

## 2022-03-11 LAB — CBC WITH DIFFERENTIAL/PLATELET
Abs Immature Granulocytes: 0.01 10*3/uL (ref 0.00–0.07)
Basophils Absolute: 0 10*3/uL (ref 0.0–0.1)
Basophils Relative: 0 %
Eosinophils Absolute: 0.2 10*3/uL (ref 0.0–0.5)
Eosinophils Relative: 4 %
HCT: 41.9 % (ref 39.0–52.0)
Hemoglobin: 13.8 g/dL (ref 13.0–17.0)
Immature Granulocytes: 0 %
Lymphocytes Relative: 26 %
Lymphs Abs: 1.2 10*3/uL (ref 0.7–4.0)
MCH: 32.5 pg (ref 26.0–34.0)
MCHC: 32.9 g/dL (ref 30.0–36.0)
MCV: 98.8 fL (ref 80.0–100.0)
Monocytes Absolute: 0.4 10*3/uL (ref 0.1–1.0)
Monocytes Relative: 9 %
Neutro Abs: 2.8 10*3/uL (ref 1.7–7.7)
Neutrophils Relative %: 61 %
Platelets: 144 10*3/uL — ABNORMAL LOW (ref 150–400)
RBC: 4.24 MIL/uL (ref 4.22–5.81)
RDW: 13.6 % (ref 11.5–15.5)
WBC: 4.5 10*3/uL (ref 4.0–10.5)
nRBC: 0 % (ref 0.0–0.2)

## 2022-03-11 MED ORDER — LANREOTIDE ACETATE 120 MG/0.5ML ~~LOC~~ SOLN
120.0000 mg | Freq: Once | SUBCUTANEOUS | Status: AC
  Administered 2022-03-11: 120 mg via SUBCUTANEOUS
  Filled 2022-03-11: qty 120

## 2022-03-11 NOTE — Patient Instructions (Signed)
[facility]  Discharge Instructions  You were seen and examined today by Dr. Delton Coombes.  Dr. Delton Coombes discussed your most recent lab work which revealed that everything looks stable.  Follow-up as scheduled after CT scan.    Thank you for choosing [facility] to provide your oncology and hematology care.   To afford each patient quality time with our provider, please arrive at least 15 minutes before your scheduled appointment time. You may need to reschedule your appointment if you arrive late (10 or more minutes). Arriving late affects you and other patients whose appointments are after yours.  Also, if you miss three or more appointments without notifying the office, you may be dismissed from the clinic at the provider's discretion.    Again, thank you for choosing [facility].  Our hope is that these requests will decrease the amount of time that you wait before being seen by our physicians.   If you have a lab appointment with the [facility] please come in thru the Main Entrance and check in at the main information desk.           _____________________________________________________________  Should you have questions after your visit to [facility], please contact our office at [phone] and follow the prompts.  Our office hours are 8:00 a.m. to 4:30 p.m. Monday - Thursday and 8:00 a.m. to 2:30 p.m. Friday.  Please note that voicemails left after 4:00 p.m. may not be returned until the following business day.  We are closed weekends and all major holidays.  You do have access to a nurse 24-7, just call the main number to the clinic [phone] and do not press any options, hold on the line and a nurse will answer the phone.    For prescription refill requests, have your pharmacy contact our office and allow 72 hours.    Masks are optional in the cancer centers. If you would like for your care  team to wear a mask while they are taking care of you, please let them know. You may have one support person who is at least 56 years old accompany you for your appointments.

## 2022-03-11 NOTE — Progress Notes (Signed)
[place], [place] [address]   CLINIC:  Medical Oncology/Hematology  PCP:  Olin Hauser, [place] / [place] [address] [phone]   REASON FOR VISIT:  Follow-up for neuroendocrine tumor, on lanreotid  PRIOR THERAPY: none  NGS Results: not done  CURRENT THERAPY: Lanreotide monthly  BRIEF ONCOLOGIC HISTORY:  Oncology History   No history exists.    CANCER STAGING:  Cancer Staging  No matching staging information was found for the patient.  INTERVAL HISTORY:  [name], a 56 y.o. male, seen for follow-up of malignant carcinoid tumor.  Denies any flushing, wheezing or diarrhea.  He usually gets diarrhea for a week after each lanreotide injection followed by 2 weeks of regular bowel movements.  1 week prior to his next injection, he gets constipated.  Chronic back pain is stable.  Energy levels are 100%.  He has missed his CT scan due to family death.  REVIEW OF SYSTEMS:  Review of Systems  Gastrointestinal:  Positive for constipation and diarrhea.  All other systems reviewed and are negative.   PAST MEDICAL/SURGICAL HISTORY:  Past Medical History:  Diagnosis Date   Cirrhosis ([place])    Hepatitis C    Ab + 07/28/19. RNA 436,000   HTN (hypertension)    Past Surgical History:  Procedure Laterality Date   debridement  Right    per pt, debridement of rt hand    SOCIAL HISTORY:  Social History   Socioeconomic History   Marital status: Divorced    Spouse name: Not on file   Number of children: 4   Years of education: Not on file   Highest education level: Not on file  Occupational History   Occupation: unemployed  Tobacco Use   Smoking status: Never   Smokeless tobacco: Current    Types: Snuff   Tobacco comments:    started using snuff since 1986  Vaping Use   Vaping Use: Never used  Substance and Sexual Activity   Alcohol use: Not Currently    Comment: occasional   Drug use: Never    Sexual activity: Not Currently  Other Topics Concern   Not on file  Social History Narrative   Not on file   Social Determinants of Health   Financial Resource Strain: Low Risk  (08/02/2019)   Overall Financial Resource Strain (CARDIA)    Difficulty of Paying Living Expenses: Not very hard  Food Insecurity: No Food Insecurity (08/02/2019)   Hunger Vital Sign    Worried About Running Out of Food in the Last Year: Never true    Ran Out of Food in the Last Year: Never true  Transportation Needs: Unmet Transportation Needs (08/02/2019)   PRAPARE - Transportation    Lack of Transportation (Medical): Yes    Lack of Transportation (Non-Medical): Yes  Physical Activity: Inactive (08/02/2019)   Exercise Vital Sign    Days of Exercise per Week: 0 days    Minutes of Exercise per Session: 0 min  Stress: Stress Concern Present (08/02/2019)   [place]    Feeling of Stress : To some extent  Social Connections: Socially Isolated (08/02/2019)   Social Connection and Isolation Panel [NHANES]    Frequency of Communication with Friends and Family: Three times a week    Frequency of Social Gatherings with Friends and Family: Three times a week    Attends Religious Services: Never    Active Member  of Clubs or Organizations: No    Attends Archivist Meetings: Never    Marital Status: Divorced  Human resources officer Violence: Not At Risk (08/02/2019)   Humiliation, Afraid, Rape, and Kick questionnaire    Fear of Current or Ex-Partner: No    Emotionally Abused: No    Physically Abused: No    Sexually Abused: No    FAMILY HISTORY:  Family History  Problem Relation Age of Onset   Cirrhosis Sister        per patient, due to drug use.    Alzheimer's disease Mother    Cancer Mother    Diabetes Mother    Congestive Heart Failure Father    Diabetes Father    Alzheimer's disease Paternal Grandmother    Osteoporosis Paternal  Grandmother    Diabetes Daughter    Colon cancer Neg Hx     CURRENT MEDICATIONS:  Current Outpatient Medications  Medication Sig Dispense Refill   clotrimazole (LOTRIMIN) 1 % cream Apply 1 application topically 2 (two) times daily. 30 g 3   diphenoxylate-atropine (LOMOTIL) 2.5-0.025 MG tablet Take 2 tablets after first watery stool, then 1 tablet after each watery stool.  No more than 8 tablets daily. 60 tablet 3   Ensure (ENSURE) Take 237 mLs by mouth every evening.     furosemide (LASIX) 40 MG tablet TAKE 1 TABLET BY MOUTH EVERY DAY 30 tablet 3   Hepatitis A-Hep B Recomb Vac (TWINRIX) 720-20 ELU-MCG/ML injection Inject into the muscle. Pt has last shot in July     pantoprazole (PROTONIX) 40 MG tablet Take 1 tablet (40 mg total) by mouth daily. 30 tablet 3   propranolol (INDERAL) 20 MG tablet Take 1 tablet by mouth daily.     sertraline (ZOLOFT) 50 MG tablet Take 1 tablet (50 mg total) by mouth daily. 60 tablet 0   sildenafil (VIAGRA) 50 MG tablet Take by mouth.     spironolactone (ALDACTONE) 100 MG tablet Take one tablet in AM 60 tablet 2   No current facility-administered medications for this visit.   Facility-Administered Medications Ordered in Other Visits  Medication Dose Route Frequency Provider Last Rate Last Admin   epoetin alfa-epbx (RETACRIT) 50277 UNIT/ML injection            epoetin alfa-epbx (RETACRIT) 41287 UNIT/ML injection            lanreotide acetate (SOMATULINE DEPOT) 120 MG/0.5ML injection             ALLERGIES:  No Known Allergies  PHYSICAL EXAM:  Performance status (ECOG): 1 - Symptomatic but completely ambulatory  Vitals:   03/11/22 1407  BP: 133/83  Pulse: 72  Resp: 18  Temp: 98.2 F (36.8 C)    Wt Readings from Last 3 Encounters:  03/11/22 (!) 314 lb 9.6 oz (142.7 kg)  01/14/22 (!) 310 lb 4.8 oz (140.8 kg)  12/17/21 (!) 312 lb 2.7 oz (141.6 kg)   Physical Exam Vitals reviewed.  Constitutional:      Appearance: Normal appearance. He is  obese.  Cardiovascular:     Rate and Rhythm: Normal rate and regular rhythm.     Pulses: Normal pulses.     Heart sounds: Normal heart sounds.  Pulmonary:     Effort: Pulmonary effort is normal.     Breath sounds: Normal breath sounds.  Neurological:     General: No focal deficit present.     Mental Status: He is alert and oriented to person, place, and time.  Psychiatric:        Mood and Affect: Mood normal.        Behavior: Behavior normal.      LABORATORY DATA:  I have reviewed the labs as listed.     Latest Ref Rng & Units 03/11/2022   12:47 PM 01/14/2022   12:22 PM 10/22/2021   11:46 AM  CBC  WBC 4.0 - 10.5 K/uL 4.5  5.1  6.3   Hemoglobin 13.0 - 17.0 g/dL 13.8  14.2  14.1   Hematocrit 39.0 - 52.0 % 41.9  42.6  41.7   Platelets 150 - 400 K/uL 144  127  145       Latest Ref Rng & Units 03/11/2022   12:47 PM 01/14/2022   12:22 PM 10/22/2021   11:46 AM  CMP  Glucose 70 - 99 mg/dL 118  116  103   BUN 6 - 20 mg/dL '22  16  18   '$ Creatinine 0.61 - 1.24 mg/dL 1.14  1.10  1.12   Sodium 135 - 145 mmol/L 138  139  136   Potassium 3.5 - 5.1 mmol/L 3.9  3.9  4.3   Chloride 98 - 111 mmol/L 103  104  105   CO2 22 - 32 mmol/L '27  27  25   '$ Calcium 8.9 - 10.3 mg/dL 8.7  8.7  8.4   Total Protein 6.5 - 8.1 g/dL 8.3  7.8  8.1   Total Bilirubin 0.3 - 1.2 mg/dL 0.9  1.1  1.3   Alkaline Phos 38 - 126 U/L 102  90  127   AST 15 - 41 U/L '31  28  31   '$ ALT 0 - 44 U/L '17  18  24     '$ DIAGNOSTIC IMAGING:  I have independently reviewed the scans and discussed with the patient. No results found.   ASSESSMENT:  1.   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  Possible mild nodular infiltration in the left upper quadrant mesentery and anterior omentum.  Multiple small mesenteric root nodes. -PET scan on 09/05/2019 showed 4.2 cm right  anterior mesenteric mass with punctate calcifications, SUV 7.2.  Moderate ascites present.  No definite retroperitoneal adenopathy.  Liver is cirrhotic with spleen enlarged.  Diffusely elevated hepatic activity which is nonspecific. -CT-guided biopsy of the mesenteric mass on 10/09/2019 at [facility] consistent with well-differentiated neuroendocrine tumor (carcinoid tumor).  Tumor cells are strongly positive for chromogranin and synaptophysin.  Negative for CK7, CK20, TTF-1, PSA, NKX3.1, S100, HMB-45, and CD56. -Gallium dotatate PET CT scan on 11/08/2019 at [facility] shows large solid mesenteric midline abdomen mass with increased activity measuring 6.5 cm, SUV of 70.  In the uncinate process there is an area of increased activity visualized since seen on series 603 image 91 measuring 13.7 SUV.  Increased activity throughout the liver and spleen limiting evaluation for metastatic disease.  Hepatic activity is heterogeneous. -Lanreotide started on 11/13/2019. - CT CAP (07/11/2021): 4.6 cm soft tissue mass along the mesenteric root.  No significant abdominal adenopathy.  Small volume ascites.  Cirrhosis.   2.  Cirrhosis and ascites: -Thought to be secondary to hepatitis C. -Last paracentesis on 08/21/2019 with 4.8 L removed.   PLAN:  1.  Well differentiated neuroendocrine tumor of the mesenteric mass: - CT CAP (07/11/2021): 4.6 cm soft tissue mass along the mesenteric  root is stable.  Small volume ascites. - He has diarrhea for 1 week after each lanreotide injection followed by 2 weeks of normal bowel movements.  He gets constipated 1 week prior to each lanreotide injection. - We reviewed labs which showed normal LFTs, creatinine.  CBC was grossly normal with mild thrombocytopenia.  Last chromogranin was 185. - Recommend continuing lanreotide monthly. - We will plan to arrange CTAP with contrast at [facility] per patient request. - RTC in 1 month.   2.  Cirrhosis and  ascites: - He is status post completion of hepatitis C treatment with sobers for weight. - LFTs are normal except low albumin 3.1.    3.  Fluid retention: - Continue furosemide 40 mg and spironolactone 100 mg every other day.   Orders placed this encounter:  Orders Placed This Encounter  Procedures   CT Abdomen Pelvis [place], MD [place] [phone]

## 2022-03-11 NOTE — Patient Instructions (Signed)
[facility]  Discharge Instructions: Thank you for choosing [facility] to provide your oncology and hematology care.  If you have a lab appointment with the [facility], please come in thru the Main Entrance and check in at the main information desk.  Wear comfortable clothing and clothing appropriate for easy access to any Portacath or PICC line.   We strive to give you quality time with your provider. You may need to reschedule your appointment if you arrive late (15 or more minutes).  Arriving late affects you and other patients whose appointments are after yours.  Also, if you miss three or more appointments without notifying the office, you may be dismissed from the clinic at the provider's discretion.      For prescription refill requests, have your pharmacy contact our office and allow 72 hours for refills to be completed.    Today you received your lanreotide injection   To help prevent nausea and vomiting after your treatment, we encourage you to take your nausea medication as directed.  BELOW ARE SYMPTOMS THAT SHOULD BE REPORTED IMMEDIATELY: *FEVER GREATER THAN 100.4 F (38 C) OR HIGHER *CHILLS OR SWEATING *NAUSEA AND VOMITING THAT IS NOT CONTROLLED WITH YOUR NAUSEA MEDICATION *UNUSUAL SHORTNESS OF BREATH *UNUSUAL BRUISING OR BLEEDING *URINARY PROBLEMS (pain or burning when urinating, or frequent urination) *BOWEL PROBLEMS (unusual diarrhea, constipation, pain near the anus) TENDERNESS IN MOUTH AND THROAT WITH OR WITHOUT PRESENCE OF ULCERS (sore throat, sores in mouth, or a toothache) UNUSUAL RASH, SWELLING OR PAIN  UNUSUAL VAGINAL DISCHARGE OR ITCHING   Items with * indicate a potential emergency and should be followed up as soon as possible or go to the Emergency Department if any problems should occur.  Please show the CHEMOTHERAPY ALERT CARD or IMMUNOTHERAPY ALERT CARD at check-in to the Emergency Department and triage nurse.  Should  you have questions after your visit or need to cancel or reschedule your appointment, please contact [facility] [phone]  and follow the prompts.  Office hours are 8:00 a.m. to 4:30 p.m. Monday - Friday. Please note that voicemails left after 4:00 p.m. may not be returned until the following business day.  We are closed weekends and major holidays. You have access to a nurse at all times for urgent questions. Please call the main number to the clinic [phone] and follow the prompts.  For any non-urgent questions, you may also contact your provider using MyChart. We now offer e-Visits for anyone 67 and older to request care online for non-urgent symptoms. For details visit [email or web address].   Also download the MyChart app! Go to the app store, search "MyChart", open the app, select [facility], and log in with your MyChart username and password.  Masks are optional in the cancer centers. If you would like for your care team to wear a mask while they are taking care of you, please let them know. You may have one support person who is at least 56 years old accompany you for your appointments.

## 2022-03-11 NOTE — Progress Notes (Signed)
Patient has been assessed, vital signs and labs have been reviewed by Dr. Katragadda. ANC, Creatinine, LFTs, and Platelets are within treatment parameters per Dr. Katragadda. The patient is good to proceed with treatment at this time. Primary RN and pharmacy aware.  

## 2022-03-11 NOTE — Progress Notes (Signed)
Lanreotide injection  given per orders. Patient tolerated it well without problems. Vitals stable and discharged home from clinic ambulatory. Follow up as scheduled.  

## 2022-03-16 LAB — CHROMOGRANIN A: Chromogranin A (ng/mL): 208.5 ng/mL — ABNORMAL HIGH (ref 0.0–101.8)

## 2022-03-17 ENCOUNTER — Encounter: Payer: Self-pay | Admitting: *Deleted

## 2022-03-18 ENCOUNTER — Other Ambulatory Visit: Payer: Self-pay

## 2022-03-18 DIAGNOSIS — C7A00 Malignant carcinoid tumor of unspecified site: Secondary | ICD-10-CM

## 2022-03-24 NOTE — Telephone Encounter (Signed)
See message.

## 2022-03-31 ENCOUNTER — Encounter (HOSPITAL_COMMUNITY): Payer: Self-pay | Admitting: Hematology

## 2022-04-07 ENCOUNTER — Other Ambulatory Visit: Payer: Self-pay

## 2022-04-07 DIAGNOSIS — C7A00 Malignant carcinoid tumor of unspecified site: Secondary | ICD-10-CM

## 2022-04-08 ENCOUNTER — Inpatient Hospital Stay: Payer: Medicaid Other

## 2022-04-08 ENCOUNTER — Inpatient Hospital Stay: Payer: Medicaid Other | Attending: Hematology

## 2022-04-08 ENCOUNTER — Inpatient Hospital Stay (HOSPITAL_BASED_OUTPATIENT_CLINIC_OR_DEPARTMENT_OTHER): Payer: Medicaid Other | Admitting: Hematology

## 2022-04-08 VITALS — BP 111/78 | HR 84 | Temp 98.0°F | Resp 16 | Wt 306.6 lb

## 2022-04-08 DIAGNOSIS — R1031 Right lower quadrant pain: Secondary | ICD-10-CM | POA: Diagnosis not present

## 2022-04-08 DIAGNOSIS — K7460 Unspecified cirrhosis of liver: Secondary | ICD-10-CM | POA: Diagnosis not present

## 2022-04-08 DIAGNOSIS — C7A00 Malignant carcinoid tumor of unspecified site: Secondary | ICD-10-CM

## 2022-04-08 DIAGNOSIS — R188 Other ascites: Secondary | ICD-10-CM | POA: Diagnosis not present

## 2022-04-08 DIAGNOSIS — C7A098 Malignant carcinoid tumors of other sites: Secondary | ICD-10-CM | POA: Insufficient documentation

## 2022-04-08 LAB — COMPREHENSIVE METABOLIC PANEL
ALT: 16 U/L (ref 0–44)
AST: 25 U/L (ref 15–41)
Albumin: 2.9 g/dL — ABNORMAL LOW (ref 3.5–5.0)
Alkaline Phosphatase: 101 U/L (ref 38–126)
Anion gap: 7 (ref 5–15)
BUN: 15 mg/dL (ref 6–20)
CO2: 29 mmol/L (ref 22–32)
Calcium: 9 mg/dL (ref 8.9–10.3)
Chloride: 101 mmol/L (ref 98–111)
Creatinine, Ser: 1.03 mg/dL (ref 0.61–1.24)
GFR, Estimated: >60 mL/min (ref >60)
Glucose, Bld: 110 mg/dL — ABNORMAL HIGH (ref 70–99)
Potassium: 4.1 mmol/L (ref 3.5–5.1)
Sodium: 137 mmol/L (ref 135–145)
Total Bilirubin: 1.6 mg/dL — ABNORMAL HIGH (ref 0.3–1.2)
Total Protein: 8.3 g/dL — ABNORMAL HIGH (ref 6.5–8.1)

## 2022-04-08 LAB — CBC WITH DIFFERENTIAL/PLATELET
Abs Immature Granulocytes: 0.01 10*3/uL (ref 0.00–0.07)
Basophils Absolute: 0 10*3/uL (ref 0.0–0.1)
Basophils Relative: 1 %
Eosinophils Absolute: 0.2 10*3/uL (ref 0.0–0.5)
Eosinophils Relative: 5 %
HCT: 42.7 % (ref 39.0–52.0)
Hemoglobin: 14 g/dL (ref 13.0–17.0)
Immature Granulocytes: 0 %
Lymphocytes Relative: 22 %
Lymphs Abs: 1.1 10*3/uL (ref 0.7–4.0)
MCH: 32 pg (ref 26.0–34.0)
MCHC: 32.8 g/dL (ref 30.0–36.0)
MCV: 97.5 fL (ref 80.0–100.0)
Monocytes Absolute: 0.5 10*3/uL (ref 0.1–1.0)
Monocytes Relative: 9 %
Neutro Abs: 3.4 10*3/uL (ref 1.7–7.7)
Neutrophils Relative %: 63 %
Platelets: 164 10*3/uL (ref 150–400)
RBC: 4.38 MIL/uL (ref 4.22–5.81)
RDW: 13.2 % (ref 11.5–15.5)
WBC: 5.2 10*3/uL (ref 4.0–10.5)
nRBC: 0 % (ref 0.0–0.2)

## 2022-04-08 MED ORDER — LANREOTIDE ACETATE 120 MG/0.5ML ~~LOC~~ SOLN
120.0000 mg | Freq: Once | SUBCUTANEOUS | Status: AC
  Administered 2022-04-08: 120 mg via SUBCUTANEOUS
  Filled 2022-04-08: qty 120

## 2022-04-08 NOTE — Patient Instructions (Addendum)
Nicolas [name] at [facility] Discharge Instructions   You were seen and examined today by Dr. Delton Coombes.  He reviewed the results of your lab work which are normal/stable.  We will repeat a CT scan prior to your next visit.   Return as scheduled.    Thank you for choosing [facility] at [facility] to provide your oncology and hematology care.  To afford each patient quality time with our provider, please arrive at least 15 minutes before your scheduled appointment time.   If you have a lab appointment with the [facility] please come in thru the Main Entrance and check in at the main information desk.  You need to re-schedule your appointment should you arrive 10 or more minutes late.  We strive to give you quality time with our providers, and arriving late affects you and other patients whose appointments are after yours.  Also, if you no show three or more times for appointments you may be dismissed from the clinic at the providers discretion.     Again, thank you for choosing [facility].  Our hope is that these requests will decrease the amount of time that you wait before being seen by our physicians.       _____________________________________________________________  Should you have questions after your visit to [facility], please contact our office at [phone] and follow the prompts.  Our office hours are 8:00 a.m. and 4:30 p.m. Monday - Friday.  Please note that voicemails left after 4:00 p.m. may not be returned until the following business day.  We are closed weekends and major holidays.  You do have access to a nurse 24-7, just call the main number to the clinic [phone] and do not press any options, hold on the line and a nurse will answer the phone.    For prescription refill requests, have your pharmacy contact our office and allow 72 hours.    Due to Covid, you will need to wear a mask upon  entering the hospital. If you do not have a mask, a mask will be given to you at the Main Entrance upon arrival. For doctor visits, patients may have 1 support person age 53 or older with them. For treatment visits, patients can not have anyone with them due to social distancing guidelines and our immunocompromised population.

## 2022-04-08 NOTE — Progress Notes (Signed)
Nicolas [name], [place] [address]   CLINIC:  Medical Oncology/Hematology  PCP:  Nicolas [name], Nicolas [name] / Nicolas [name] [place] [address] [phone]   REASON FOR VISIT:  Follow-up for neuroendocrine tumor, on lanreotide  PRIOR THERAPY: none  NGS Results: not done  CURRENT THERAPY: Lanreotide monthly  BRIEF ONCOLOGIC HISTORY:  Oncology History   No history exists.    CANCER STAGING:  Cancer Staging  No matching staging information was found for the patient.  INTERVAL HISTORY:  [name], a 56 y.o. 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  He is tolerating lanreotide very well.  He reports right lower quadrant pain for the last 3 weeks and woke up with it.  It is a constant pain and is slightly better if he lies on the right side.  No other alleviating or aggravating factors.  He still could not have his CT scan done in [place] due to scheduling issues.  REVIEW OF SYSTEMS:  Review of Systems  Gastrointestinal:  Positive for constipation and diarrhea.  All other systems reviewed and are negative.   PAST MEDICAL/SURGICAL HISTORY:  Past Medical History:  Diagnosis Date   Cirrhosis ([place])    Hepatitis C    Ab + 07/28/19. RNA 436,000   HTN (hypertension)    Past Surgical History:  Procedure Laterality Date   debridement  Right    per pt, debridement of rt hand    SOCIAL HISTORY:  Social History   Socioeconomic History   Marital status: Divorced    Spouse name: Not on file   Number of children: 4   Years of education: Not on file   Highest education level: Not on file  Occupational History   Occupation: unemployed  Tobacco Use   Smoking status: Never   Smokeless tobacco: Current    Types: Snuff   Tobacco comments:    started using snuff since 1986  Vaping Use   Vaping Use: Never used  Substance and Sexual Activity   Alcohol use: Not Currently     Comment: occasional   Drug use: Never   Sexual activity: Not Currently  Other Topics Concern   Not on file  Social History Narrative   Not on file   Social Determinants of Health   Financial Resource Strain: Low Risk  (08/02/2019)   Overall Financial Resource Strain (CARDIA)    Difficulty of Paying Living Expenses: Not very hard  Food Insecurity: No Food Insecurity (08/02/2019)   Hunger Vital Sign    Worried About Running Out of Food in the Last Year: Never true    Ran Out of Food in the Last Year: Never true  Transportation Needs: Unmet Transportation Needs (08/02/2019)   PRAPARE - Transportation    Lack of Transportation (Medical): Yes    Lack of Transportation (Non-Medical): Yes  Physical Activity: Inactive (08/02/2019)   Exercise Vital Sign    Days of Exercise per Week: 0 days    Minutes of Exercise per Session: 0 min  Stress: Stress Concern Present (08/02/2019)   [place]    Feeling of Stress : To some extent  Social Connections: Socially Isolated (08/02/2019)   Social Connection and Isolation Panel [NHANES]    Frequency of Communication with Friends and Family: Three times a week    Frequency of Social Gatherings with Friends and Family: Three times a week    Attends  Religious Services: Never    Active Member of Clubs or Organizations: No    Attends Archivist Meetings: Never    Marital Status: Divorced  Human resources officer Violence: Not At Risk (08/02/2019)   Humiliation, Afraid, Rape, and Kick questionnaire    Fear of Current or Ex-Partner: No    Emotionally Abused: No    Physically Abused: No    Sexually Abused: No    FAMILY HISTORY:  Family History  Problem Relation Age of Onset   Cirrhosis Sister        per patient, due to drug use.    Alzheimer's disease Mother    Cancer Mother    Diabetes Mother    Congestive Heart Failure Father    Diabetes Father    Alzheimer's disease Paternal  Grandmother    Osteoporosis Paternal Grandmother    Diabetes Daughter    Colon cancer Neg Hx     CURRENT MEDICATIONS:  Current Outpatient Medications  Medication Sig Dispense Refill   clotrimazole (LOTRIMIN) 1 % cream Apply 1 application topically 2 (two) times daily. 30 g 3   diphenoxylate-atropine (LOMOTIL) 2.5-0.025 MG tablet Take 2 tablets after first watery stool, then 1 tablet after each watery stool.  No more than 8 tablets daily. 60 tablet 3   Ensure (ENSURE) Take 237 mLs by mouth every evening.     furosemide (LASIX) 40 MG tablet TAKE 1 TABLET BY MOUTH EVERY DAY 30 tablet 3   Hepatitis A-Hep B Recomb Vac (TWINRIX) 720-20 ELU-MCG/ML injection Inject into the muscle. Pt has last shot in July     pantoprazole (PROTONIX) 40 MG tablet Take 1 tablet (40 mg total) by mouth daily. 30 tablet 3   propranolol (INDERAL) 20 MG tablet Take 1 tablet by mouth daily.     sertraline (ZOLOFT) 50 MG tablet Take 1 tablet (50 mg total) by mouth daily. 60 tablet 0   sildenafil (VIAGRA) 50 MG tablet Take by mouth.     spironolactone (ALDACTONE) 100 MG tablet Take one tablet in AM 60 tablet 2   No current facility-administered medications for this visit.   Facility-Administered Medications Ordered in Other Visits  Medication Dose Route Frequency Provider Last Rate Last Admin   epoetin alfa-epbx (RETACRIT) 62831 UNIT/ML injection            epoetin alfa-epbx (RETACRIT) 51761 UNIT/ML injection            lanreotide acetate (SOMATULINE DEPOT) 120 MG/0.5ML injection            lanreotide acetate (SOMATULINE DEPOT) injection 120 mg  120 mg Subcutaneous Once Derek Jack, MD        ALLERGIES:  No Known Allergies  PHYSICAL EXAM:  Performance status (ECOG): 1 - Symptomatic but completely ambulatory  There were no vitals filed for this visit.   Wt Readings from Last 3 Encounters:  03/11/22 (!) 314 lb 9.6 oz (142.7 kg)  01/14/22 (!) 310 lb 4.8 oz (140.8 kg)  12/17/21 (!) 312 lb 2.7 oz (141.6  kg)   Physical Exam Vitals reviewed.  Constitutional:      Appearance: Normal appearance. He is obese.  Cardiovascular:     Rate and Rhythm: Normal rate and regular rhythm.     Pulses: Normal pulses.     Heart sounds: Normal heart sounds.  Pulmonary:     Effort: Pulmonary effort is normal.     Breath sounds: Normal breath sounds.  Neurological:     General: No focal deficit present.  Mental Status: He is alert and oriented to person, place, and time.  Psychiatric:        Mood and Affect: Mood normal.        Behavior: Behavior normal.     LABORATORY DATA:  I have reviewed the labs as listed.     Latest Ref Rng & Units 03/11/2022   12:47 PM 01/14/2022   12:22 PM 10/22/2021   11:46 AM  CBC  WBC 4.0 - 10.5 K/uL 4.5  5.1  6.3   Hemoglobin 13.0 - 17.0 g/dL 13.8  14.2  14.1   Hematocrit 39.0 - 52.0 % 41.9  42.6  41.7   Platelets 150 - 400 K/uL 144  127  145       Latest Ref Rng & Units 03/11/2022   12:47 PM 01/14/2022   12:22 PM 10/22/2021   11:46 AM  CMP  Glucose 70 - 99 mg/dL 118  116  103   BUN 6 - 20 mg/dL '22  16  18   '$ Creatinine 0.61 - 1.24 mg/dL 1.14  1.10  1.12   Sodium 135 - 145 mmol/L 138  139  136   Potassium 3.5 - 5.1 mmol/L 3.9  3.9  4.3   Chloride 98 - 111 mmol/L 103  104  105   CO2 22 - 32 mmol/L '27  27  25   '$ Calcium 8.9 - 10.3 mg/dL 8.7  8.7  8.4   Total Protein 6.5 - 8.1 g/dL 8.3  7.8  8.1   Total Bilirubin 0.3 - 1.2 mg/dL 0.9  1.1  1.3   Alkaline Phos 38 - 126 U/L 102  90  127   AST 15 - 41 U/L '31  28  31   '$ ALT 0 - 44 U/L '17  18  24     '$ DIAGNOSTIC IMAGING:  I have independently reviewed the scans and discussed with the patient. No results found.   ASSESSMENT:  1.   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  Possible mild nodular infiltration in the left upper quadrant mesentery and  anterior omentum.  Multiple small mesenteric root nodes. -PET scan on 09/05/2019 showed 4.2 cm right anterior mesenteric mass with punctate calcifications, SUV 7.2.  Moderate ascites present.  No definite retroperitoneal adenopathy.  Liver is cirrhotic with spleen enlarged.  Diffusely elevated hepatic activity which is nonspecific. -CT-guided biopsy of the mesenteric mass on 10/09/2019 at [facility] consistent with well-differentiated neuroendocrine tumor (carcinoid tumor).  Tumor cells are strongly positive for chromogranin and synaptophysin.  Negative for CK7, CK20, TTF-1, PSA, NKX3.1, S100, HMB-45, and CD56. -Gallium dotatate PET CT scan on 11/08/2019 at [facility] shows large solid mesenteric midline abdomen mass with increased activity measuring 6.5 cm, SUV of 70.  In the uncinate process there is an area of increased activity visualized since seen on series 603 image 91 measuring 13.7 SUV.  Increased activity throughout the liver and spleen limiting evaluation for metastatic disease.  Hepatic activity is heterogeneous. -Lanreotide started on 11/13/2019. - CT CAP (07/11/2021): 4.6 cm soft tissue mass along the mesenteric root.  No significant abdominal adenopathy.  Small volume ascites.  Cirrhosis.   2.  Cirrhosis and ascites: -Thought to be secondary to hepatitis C. -Last paracentesis on 08/21/2019 with 4.8 L removed.   PLAN:  1.  Well differentiated neuroendocrine tumor of the mesenteric mass: -  CT CAP on 07/11/2021 showed 4.6 cm soft tissue mass along the mesenteric root which is stable.  Small volume ascites. - He has diarrhea 1 week after each lanreotide injection followed by 2 weeks of normal BM and constipation 1 week prior to his injection. - Reviewed labs today which showed normal LFTs and CBC.  Last chromogranin was 208. - He has developed right lower quadrant pain for the last 3 weeks, woke up with it.  Pain is described as constant.  Only thing that makes it better is  lying on the right side.  No aggravating factors reported.  No radiation.  Physical examination shows tender area in the right lower quadrant without any palpable mass. - We have ordered CT AP with contrast at last visit.  His insurance requires him to have it done in [place].  We have sent requisition to [facility] imaging center about 3 weeks ago.  They are having scheduling issues and could not get him in yet.  We have called the imaging center so that the scan can be done as soon as possible. - Today he will proceed with lanreotide injection.  RTC 4 weeks for follow-up.  He will go to the ER if his pain is worse.   2.  Cirrhosis and ascites: -He is status post completion of hepatitis C treatment.    3.  Fluid retention: -Continue furosemide 40 mg and spironolactone 100 mg every other day.  Fluid is very well-controlled.   Orders placed this encounter:  No orders of the defined types were placed in this encounter.     Derek Jack, MD [place] [phone]

## 2022-04-08 NOTE — Progress Notes (Signed)
Patient tolerated lanreotide injection with no complaints voiced. Site clean and dry with no bruising or swelling noted at site. See MAR for details. Band aid applied.  Patient stable during and after injection. VSS with discharge and left in satisfactory condition with no s/s of distress noted.

## 2022-04-08 NOTE — Patient Instructions (Signed)
[facility]  Discharge Instructions: Thank you for choosing [facility] to provide your oncology and hematology care.  If you have a lab appointment with the [facility], please come in thru the Main Entrance and check in at the main information desk.  Wear comfortable clothing and clothing appropriate for easy access to any Portacath or PICC line.   We strive to give you quality time with your provider. You may need to reschedule your appointment if you arrive late (15 or more minutes).  Arriving late affects you and other patients whose appointments are after yours.  Also, if you miss three or more appointments without notifying the office, you may be dismissed from the clinic at the provider's discretion.      For prescription refill requests, have your pharmacy contact our office and allow 72 hours for refills to be completed.    Today you received the following lanreotide, return as scheduled.   To help prevent nausea and vomiting after your treatment, we encourage you to take your nausea medication as directed.  BELOW ARE SYMPTOMS THAT SHOULD BE REPORTED IMMEDIATELY: *FEVER GREATER THAN 100.4 F (38 C) OR HIGHER *CHILLS OR SWEATING *NAUSEA AND VOMITING THAT IS NOT CONTROLLED WITH YOUR NAUSEA MEDICATION *UNUSUAL SHORTNESS OF BREATH *UNUSUAL BRUISING OR BLEEDING *URINARY PROBLEMS (pain or burning when urinating, or frequent urination) *BOWEL PROBLEMS (unusual diarrhea, constipation, pain near the anus) TENDERNESS IN MOUTH AND THROAT WITH OR WITHOUT PRESENCE OF ULCERS (sore throat, sores in mouth, or a toothache) UNUSUAL RASH, SWELLING OR PAIN  UNUSUAL VAGINAL DISCHARGE OR ITCHING   Items with * indicate a potential emergency and should be followed up as soon as possible or go to the Emergency Department if any problems should occur.  Please show the CHEMOTHERAPY ALERT CARD or IMMUNOTHERAPY ALERT CARD at check-in to the Emergency Department and  triage nurse.  Should you have questions after your visit or need to cancel or reschedule your appointment, please contact [facility] [phone]  and follow the prompts.  Office hours are 8:00 a.m. to 4:30 p.m. Monday - Friday. Please note that voicemails left after 4:00 p.m. may not be returned until the following business day.  We are closed weekends and major holidays. You have access to a nurse at all times for urgent questions. Please call the main number to the clinic [phone] and follow the prompts.  For any non-urgent questions, you may also contact your provider using MyChart. We now offer e-Visits for anyone 41 and older to request care online for non-urgent symptoms. For details visit [email or web address].   Also download the MyChart app! Go to the app store, search "MyChart", open the app, select [facility], and log in with your MyChart username and password.

## 2022-04-10 LAB — CHROMOGRANIN A: Chromogranin A (ng/mL): 180.1 ng/mL — ABNORMAL HIGH (ref 0.0–101.8)

## 2022-05-06 ENCOUNTER — Inpatient Hospital Stay: Payer: Medicaid Other | Attending: Hematology

## 2022-05-06 ENCOUNTER — Inpatient Hospital Stay: Payer: Medicaid Other

## 2022-05-06 ENCOUNTER — Encounter: Payer: Self-pay | Admitting: Hematology

## 2022-05-06 ENCOUNTER — Inpatient Hospital Stay (HOSPITAL_BASED_OUTPATIENT_CLINIC_OR_DEPARTMENT_OTHER): Payer: Medicaid Other | Admitting: Hematology

## 2022-05-06 DIAGNOSIS — C7A098 Malignant carcinoid tumors of other sites: Secondary | ICD-10-CM | POA: Diagnosis present

## 2022-05-06 DIAGNOSIS — K7460 Unspecified cirrhosis of liver: Secondary | ICD-10-CM | POA: Insufficient documentation

## 2022-05-06 DIAGNOSIS — R188 Other ascites: Secondary | ICD-10-CM | POA: Insufficient documentation

## 2022-05-06 DIAGNOSIS — C7A00 Malignant carcinoid tumor of unspecified site: Secondary | ICD-10-CM | POA: Diagnosis not present

## 2022-05-06 LAB — COMPREHENSIVE METABOLIC PANEL
ALT: 18 U/L (ref 0–44)
AST: 31 U/L (ref 15–41)
Albumin: 3.1 g/dL — ABNORMAL LOW (ref 3.5–5.0)
Alkaline Phosphatase: 90 U/L (ref 38–126)
Anion gap: 8 (ref 5–15)
BUN: 15 mg/dL (ref 6–20)
CO2: 25 mmol/L (ref 22–32)
Calcium: 8.7 mg/dL — ABNORMAL LOW (ref 8.9–10.3)
Chloride: 102 mmol/L (ref 98–111)
Creatinine, Ser: 1.04 mg/dL (ref 0.61–1.24)
GFR, Estimated: >60 mL/min (ref >60)
Glucose, Bld: 117 mg/dL — ABNORMAL HIGH (ref 70–99)
Potassium: 4.1 mmol/L (ref 3.5–5.1)
Sodium: 135 mmol/L (ref 135–145)
Total Bilirubin: 1.2 mg/dL (ref 0.3–1.2)
Total Protein: 8.4 g/dL — ABNORMAL HIGH (ref 6.5–8.1)

## 2022-05-06 LAB — CBC WITH DIFFERENTIAL/PLATELET
Abs Immature Granulocytes: 0.01 10*3/uL (ref 0.00–0.07)
Basophils Absolute: 0 10*3/uL (ref 0.0–0.1)
Basophils Relative: 1 %
Eosinophils Absolute: 0.1 10*3/uL (ref 0.0–0.5)
Eosinophils Relative: 1 %
HCT: 39.5 % (ref 39.0–52.0)
Hemoglobin: 13.1 g/dL (ref 13.0–17.0)
Immature Granulocytes: 0 %
Lymphocytes Relative: 21 %
Lymphs Abs: 1.1 10*3/uL (ref 0.7–4.0)
MCH: 31.9 pg (ref 26.0–34.0)
MCHC: 33.2 g/dL (ref 30.0–36.0)
MCV: 96.1 fL (ref 80.0–100.0)
Monocytes Absolute: 0.3 10*3/uL (ref 0.1–1.0)
Monocytes Relative: 7 %
Neutro Abs: 3.7 10*3/uL (ref 1.7–7.7)
Neutrophils Relative %: 70 %
Platelets: 146 10*3/uL — ABNORMAL LOW (ref 150–400)
RBC: 4.11 MIL/uL — ABNORMAL LOW (ref 4.22–5.81)
RDW: 13.4 % (ref 11.5–15.5)
WBC: 5.3 10*3/uL (ref 4.0–10.5)
nRBC: 0 % (ref 0.0–0.2)

## 2022-05-06 MED ORDER — LANREOTIDE ACETATE 120 MG/0.5ML ~~LOC~~ SOLN
120.0000 mg | Freq: Once | SUBCUTANEOUS | Status: AC
  Administered 2022-05-06: 120 mg via SUBCUTANEOUS

## 2022-05-06 NOTE — Progress Notes (Signed)
Sandostatin infusion  given per orders. Patient tolerated it well without problems. Vitals stable and discharged home from clinic ambulatory. Follow up as scheduled.

## 2022-05-06 NOTE — Progress Notes (Signed)
[place], [place] [address]   CLINIC:  Medical Oncology/Hematology  PCP:  Olin Hauser, [place] / [place] [address] [phone]   REASON FOR VISIT:  Follow-up for neuroendocrine tumor, on lanreotide  PRIOR THERAPY: none  NGS Results: not done  CURRENT THERAPY: Lanreotide monthly  BRIEF ONCOLOGIC HISTORY:  Oncology History   No history exists.    CANCER STAGING:  Cancer Staging  No matching staging information was found for the patient.  INTERVAL HISTORY:  [name], a 57 y.o. male, seen for follow-up of for neuroendocrine tumor and mesenteric mass.  He is tolerating lanreotide reasonably well.  Reports right sided lower back pain which occasionally radiates to the right groin.  Energy levels are 75%.  REVIEW OF SYSTEMS:  Review of Systems  Gastrointestinal:  Positive for diarrhea.  Musculoskeletal:  Positive for back pain.  Neurological:  Positive for headaches.  All other systems reviewed and are negative.   PAST MEDICAL/SURGICAL HISTORY:  Past Medical History:  Diagnosis Date   Cirrhosis ([place])    Hepatitis C    Ab + 07/28/19. RNA 436,000   HTN (hypertension)    Past Surgical History:  Procedure Laterality Date   debridement  Right    per pt, debridement of rt hand    SOCIAL HISTORY:  Social History   Socioeconomic History   Marital status: Divorced    Spouse name: Not on file   Number of children: 4   Years of education: Not on file   Highest education level: Not on file  Occupational History   Occupation: unemployed  Tobacco Use   Smoking status: Never   Smokeless tobacco: Current    Types: Snuff   Tobacco comments:    started using snuff since 1986  Vaping Use   Vaping Use: Never used  Substance and Sexual Activity   Alcohol use: Not Currently    Comment: occasional   Drug use: Never   Sexual activity: Not Currently  Other Topics Concern   Not on file   Social History Narrative   Not on file   Social Determinants of Health   Financial Resource Strain: Low Risk  (08/02/2019)   Overall Financial Resource Strain (CARDIA)    Difficulty of Paying Living Expenses: Not very hard  Food Insecurity: No Food Insecurity (08/02/2019)   Hunger Vital Sign    Worried About Running Out of Food in the Last Year: Never true    Ran Out of Food in the Last Year: Never true  Transportation Needs: Unmet Transportation Needs (08/02/2019)   PRAPARE - Transportation    Lack of Transportation (Medical): Yes    Lack of Transportation (Non-Medical): Yes  Physical Activity: Inactive (08/02/2019)   Exercise Vital Sign    Days of Exercise per Week: 0 days    Minutes of Exercise per Session: 0 min  Stress: Stress Concern Present (08/02/2019)   [place]    Feeling of Stress : To some extent  Social Connections: Socially Isolated (08/02/2019)   Social Connection and Isolation Panel [NHANES]    Frequency of Communication with Friends and Family: Three times a week    Frequency of Social Gatherings with Friends and Family: Three times a week    Attends Religious Services: Never    Active Member of Clubs or Organizations: No    Attends Archivist Meetings: Never    Marital Status: Divorced  Intimate Partner Violence: Not At Risk (08/02/2019)   Humiliation, Afraid, Rape, and Kick questionnaire    Fear of Current or Ex-Partner: No    Emotionally Abused: No    Physically Abused: No    Sexually Abused: No    FAMILY HISTORY:  Family History  Problem Relation Age of Onset   Cirrhosis Sister        per patient, due to drug use.    Alzheimer's disease Mother    Cancer Mother    Diabetes Mother    Congestive Heart Failure Father    Diabetes Father    Alzheimer's disease Paternal Grandmother    Osteoporosis Paternal Grandmother    Diabetes Daughter    Colon cancer Neg Hx     CURRENT  MEDICATIONS:  Current Outpatient Medications  Medication Sig Dispense Refill   clotrimazole (LOTRIMIN) 1 % cream Apply 1 application topically 2 (two) times daily. 30 g 3   diphenoxylate-atropine (LOMOTIL) 2.5-0.025 MG tablet Take 2 tablets after first watery stool, then 1 tablet after each watery stool.  No more than 8 tablets daily. 60 tablet 3   Ensure (ENSURE) Take 237 mLs by mouth every evening.     furosemide (LASIX) 40 MG tablet TAKE 1 TABLET BY MOUTH EVERY DAY 30 tablet 3   Hepatitis A-Hep B Recomb Vac (TWINRIX) 720-20 ELU-MCG/ML injection Inject into the muscle. Pt has last shot in July     pantoprazole (PROTONIX) 40 MG tablet Take 1 tablet (40 mg total) by mouth daily. 30 tablet 3   propranolol (INDERAL) 20 MG tablet Take 1 tablet by mouth daily.     sertraline (ZOLOFT) 50 MG tablet Take 1 tablet (50 mg total) by mouth daily. 60 tablet 0   sildenafil (VIAGRA) 50 MG tablet Take by mouth.     spironolactone (ALDACTONE) 100 MG tablet Take one tablet in AM 60 tablet 2   No current facility-administered medications for this visit.   Facility-Administered Medications Ordered in Other Visits  Medication Dose Route Frequency Provider Last Rate Last Admin   epoetin alfa-epbx (RETACRIT) 42683 UNIT/ML injection            epoetin alfa-epbx (RETACRIT) 41962 UNIT/ML injection            lanreotide acetate (SOMATULINE DEPOT) 120 MG/0.5ML injection             ALLERGIES:  No Known Allergies  PHYSICAL EXAM:  Performance status (ECOG): 1 - Symptomatic but completely ambulatory  There were no vitals filed for this visit.   Wt Readings from Last 3 Encounters:  04/08/22 (!) 306 lb 9.6 oz (139.1 kg)  03/11/22 (!) 314 lb 9.6 oz (142.7 kg)  01/14/22 (!) 310 lb 4.8 oz (140.8 kg)   Physical Exam Vitals reviewed.  Constitutional:      Appearance: Normal appearance. He is obese.  Cardiovascular:     Rate and Rhythm: Normal rate and regular rhythm.     Pulses: Normal pulses.     Heart  sounds: Normal heart sounds.  Pulmonary:     Effort: Pulmonary effort is normal.     Breath sounds: Normal breath sounds.  Neurological:     General: No focal deficit present.     Mental Status: He is alert and oriented to person, place, and time.  Psychiatric:        Mood and Affect: Mood normal.        Behavior: Behavior normal.     LABORATORY DATA:  I have reviewed the  labs as listed.     Latest Ref Rng & Units 05/06/2022   12:40 PM 04/08/2022   12:39 PM 03/11/2022   12:47 PM  CBC  WBC 4.0 - 10.5 K/uL 5.3  5.2  4.5   Hemoglobin 13.0 - 17.0 g/dL 13.1  14.0  13.8   Hematocrit 39.0 - 52.0 % 39.5  42.7  41.9   Platelets 150 - 400 K/uL 146  164  144       Latest Ref Rng & Units 04/08/2022   12:39 PM 03/11/2022   12:47 PM 01/14/2022   12:22 PM  CMP  Glucose 70 - 99 mg/dL 110  118  116   BUN 6 - 20 mg/dL '15  22  16   '$ Creatinine 0.61 - 1.24 mg/dL 1.03  1.14  1.10   Sodium 135 - 145 mmol/L 137  138  139   Potassium 3.5 - 5.1 mmol/L 4.1  3.9  3.9   Chloride 98 - 111 mmol/L 101  103  104   CO2 22 - 32 mmol/L '29  27  27   '$ Calcium 8.9 - 10.3 mg/dL 9.0  8.7  8.7   Total Protein 6.5 - 8.1 g/dL 8.3  8.3  7.8   Total Bilirubin 0.3 - 1.2 mg/dL 1.6  0.9  1.1   Alkaline Phos 38 - 126 U/L 101  102  90   AST 15 - 41 U/L '25  31  28   '$ ALT 0 - 44 U/L '16  17  18     '$ DIAGNOSTIC IMAGING:  I have independently reviewed the scans and discussed with the patient. No results found.   ASSESSMENT:  1.   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  Possible mild nodular infiltration in the left upper quadrant mesentery and anterior omentum.  Multiple small mesenteric root nodes. -PET scan on 09/05/2019 showed 4.2 cm right anterior mesenteric mass with punctate calcifications, SUV 7.2.  Moderate ascites present.  No definite  retroperitoneal adenopathy.  Liver is cirrhotic with spleen enlarged.  Diffusely elevated hepatic activity which is nonspecific. -CT-guided biopsy of the mesenteric mass on 10/09/2019 at [facility] consistent with well-differentiated neuroendocrine tumor (carcinoid tumor).  Tumor cells are strongly positive for chromogranin and synaptophysin.  Negative for CK7, CK20, TTF-1, PSA, NKX3.1, S100, HMB-45, and CD56. -Gallium dotatate PET CT scan on 11/08/2019 at [facility] shows large solid mesenteric midline abdomen mass with increased activity measuring 6.5 cm, SUV of 70.  In the uncinate process there is an area of increased activity visualized since seen on series 603 image 91 measuring 13.7 SUV.  Increased activity throughout the liver and spleen limiting evaluation for metastatic disease.  Hepatic activity is heterogeneous. -Lanreotide started on 11/13/2019. - CT CAP (07/11/2021): 4.6 cm soft tissue mass along the mesenteric root.  No significant abdominal adenopathy.  Small volume ascites.  Cirrhosis. - CT CAP (04/14/2022): Stable 4.6 cm soft tissue mass along the mesenteric root.  Cirrhosis.  Moderate volume ascites.  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   2.  Cirrhosis and ascites: -Thought to be secondary to hepatitis C. -Last paracentesis on 08/21/2019 with 4.8 L removed.   PLAN:  1.  Well differentiated neuroendocrine tumor of the mesenteric mass: - We have reviewed CT CAP from 04/14/2022.  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  4.6 cm soft tissue mass along the mesenteric root is stable. - Reviewed labs today which showed normal LFTs.  CBC was grossly normal.  Chromogranin was 180. - He has right lower back pain which radiates into the right groin occasionally. - If he develops any upper abdominal pain, will  consider dotatate imaging. - Continue lanreotide monthly.  RTC 2 months for follow-up with repeat chromogranin and other labs.   2.  Cirrhosis and ascites: - He has completed hepatitis C treatments.    3.  Fluid retention: - Continue furosemide 40 mg and spironolactone 100 mg every other day.  Fluid is well-controlled.   Orders placed this encounter:  No orders of the defined types were placed in this encounter.     Derek Jack, MD [facility] [phone]

## 2022-05-06 NOTE — Patient Instructions (Signed)
[facility]  Discharge Instructions  You were seen and examined today by Dr. Delton Coombes.  Dr. Delton Coombes discussed your most recent lab work and CT scan which revealed that everything is good and stable.  If you continue having pain then Dr. Delton Coombes will order a scan.  Follow-up as scheduled in 2 months.    Thank you for choosing [facility] to provide your oncology and hematology care.   To afford each patient quality time with our provider, please arrive at least 15 minutes before your scheduled appointment time. You may need to reschedule your appointment if you arrive late (10 or more minutes). Arriving late affects you and other patients whose appointments are after yours.  Also, if you miss three or more appointments without notifying the office, you may be dismissed from the clinic at the provider's discretion.    Again, thank you for choosing [facility].  Our hope is that these requests will decrease the amount of time that you wait before being seen by our physicians.   If you have a lab appointment with the [facility] please come in thru the Main Entrance and check in at the main information desk.           _____________________________________________________________  Should you have questions after your visit to [facility], please contact our office at [phone] and follow the prompts.  Our office hours are 8:00 a.m. to 4:30 p.m. Monday - Thursday and 8:00 a.m. to 2:30 p.m. Friday.  Please note that voicemails left after 4:00 p.m. may not be returned until the following business day.  We are closed weekends and all major holidays.  You do have access to a nurse 24-7, just call the main number to the clinic [phone] and do not press any options, hold on the line and a nurse will answer the phone.    For prescription refill requests, have your pharmacy contact our office and allow 72  hours.    Masks are optional in the cancer centers. If you would like for your care team to wear a mask while they are taking care of you, please let them know. You may have one support person who is at least 57 years old accompany you for your appointments.

## 2022-05-06 NOTE — Progress Notes (Signed)
Patient has been assessed, vital signs and labs have been reviewed by Dr. Katragadda. ANC, Creatinine, LFTs, and Platelets are within treatment parameters per Dr. Katragadda. The patient is good to proceed with treatment at this time. Primary RN and pharmacy aware.  

## 2022-05-07 LAB — CHROMOGRANIN A: Chromogranin A (ng/mL): 210.2 ng/mL — ABNORMAL HIGH (ref 0.0–101.8)

## 2022-06-03 ENCOUNTER — Inpatient Hospital Stay: Payer: Medicaid Other | Attending: Hematology

## 2022-06-03 VITALS — BP 147/90 | HR 68 | Temp 98.1°F | Resp 20

## 2022-06-03 DIAGNOSIS — C7A098 Malignant carcinoid tumors of other sites: Secondary | ICD-10-CM | POA: Insufficient documentation

## 2022-06-03 DIAGNOSIS — C7A00 Malignant carcinoid tumor of unspecified site: Secondary | ICD-10-CM

## 2022-06-03 MED ORDER — LANREOTIDE ACETATE 120 MG/0.5ML ~~LOC~~ SOLN
120.0000 mg | Freq: Once | SUBCUTANEOUS | Status: AC
  Administered 2022-06-03: 120 mg via SUBCUTANEOUS
  Filled 2022-06-03: qty 120

## 2022-06-03 NOTE — Progress Notes (Signed)
Patient tolerated Lanreotide injection with no complaints voiced.  Site clean and dry with no bruising or swelling noted.  No complaints of pain.  Discharged with vital signs stable and no signs or symptoms of distress noted.

## 2022-06-03 NOTE — Patient Instructions (Signed)
[facility]  Discharge Instructions: Thank you for choosing [facility] to provide your oncology and hematology care.  If you have a lab appointment with the [facility], please come in thru the Main Entrance and check in at the main information desk.  Wear comfortable clothing and clothing appropriate for easy access to any Portacath or PICC line.   We strive to give you quality time with your provider. You may need to reschedule your appointment if you arrive late (15 or more minutes).  Arriving late affects you and other patients whose appointments are after yours.  Also, if you miss three or more appointments without notifying the office, you may be dismissed from the clinic at the provider's discretion.      For prescription refill requests, have your pharmacy contact our office and allow 72 hours for refills to be completed.    Today you received the following chemotherapy and/or immunotherapy agents Lanreotide.  Lanreotide Injection What is this medication? LANREOTIDE (lan REE oh tide) treats high levels of growth hormone (acromegaly). It is used when other therapies have not worked well enough or cannot be tolerated. It works by reducing the amount of growth hormone your body makes. This reduces symptoms and the risk of health problems caused by too much growth hormone, such as diabetes and heart disease. It may also be used to treat neuroendocrine tumors, a cancer of the cells that release hormones and other substances in your body. It works by slowing down the release of these substances from the cells. This slows tumor growth. It also decreases the symptoms of carcinoid syndrome, such as flushing or diarrhea. This medicine may be used for other purposes; ask your health care provider or pharmacist if you have questions. COMMON BRAND NAME(S): Somatuline [place] What should I tell my care team before I take this medication? They need to know if you have  any of these conditions: Diabetes Gallbladder disease Heart disease Kidney disease Liver disease Thyroid disease An unusual or allergic reaction to lanreotide, other medications, foods, dyes, or preservatives Pregnant or trying to get pregnant Breast-feeding How should I use this medication? This medication is injected under the skin. It is given by your care team in a hospital or clinic setting. Talk to your care team about the use of this medication in children. Special care may be needed. Overdosage: If you think you have taken too much of this medicine contact a poison control center or emergency room at once. NOTE: This medicine is only for you. Do not share this medicine with others. What if I miss a dose? Keep appointments for follow-up doses. It is important not to miss your dose. Call your care team if you are unable to keep an appointment. What may interact with this medication? Bromocriptine Cyclosporine Certain medications for blood pressure, heart disease, irregular heartbeat Certain medications for diabetes Quinidine Terfenadine This list may not describe all possible interactions. Give your health care provider a list of all the medicines, herbs, non-prescription drugs, or dietary supplements you use. Also tell them if you smoke, drink alcohol, or use illegal drugs. Some items may interact with your medicine. What should I watch for while using this medication? Visit your care team for regular checks on your progress. Tell your care team if your symptoms do not start to get better or if they get worse. Your condition will be monitored carefully while you are receiving this medication. You may need blood work while  you are taking this medication. This medication may increase blood sugar. The risk may be higher in patients who already have diabetes. Ask your care team what you can do to lower your risk of diabetes while taking this medication. Talk to your care team if you  wish to become pregnant or think you may be pregnant. This medication can cause serious birth defects. Do not breast-feed while taking this medication and for 6 months after stopping therapy. This medication may cause infertility. Talk to your care team if you are concerned about your fertility. What side effects may I notice from receiving this medication? Side effects that you should report to your care team as soon as possible: Allergic reactions--skin rash, itching, hives, swelling of the face, lips, tongue, or throat Gallbladder problems--severe stomach pain, nausea, vomiting, fever High blood sugar (hyperglycemia)--increased thirst or amount of urine, unusual weakness or fatigue, blurry vision Increase in blood pressure Low blood sugar (hypoglycemia)--tremors or shaking, anxiety, sweating, cold or clammy skin, confusion, dizziness, rapid heartbeat Low thyroid levels (hypothyroidism)--unusual weakness or fatigue, increased sensitivity to cold, constipation, hair loss, dry skin, weight gain, feelings of depression Slow heartbeat--dizziness, feeling faint or lightheaded, confusion, trouble breathing, unusual weakness or fatigue Side effects that usually do not require medical attention (report to your care team if they continue or are bothersome): Diarrhea Dizziness Headache Muscle spasms Nausea Pain, redness, irritation, or bruising at the injection site Stomach pain This list may not describe all possible side effects. Call your doctor for medical advice about side effects. You may report side effects to FDA at 1-800-FDA-1088. Where should I keep my medication? This medication is given in a hospital or clinic. It will not be stored at home. NOTE: This sheet is a summary. It may not cover all possible information. If you have questions about this medicine, talk to your doctor, pharmacist, or health care provider.  2023 Elsevier/Gold Standard (2021-05-30 00:00:00)        To help  prevent nausea and vomiting after your treatment, we encourage you to take your nausea medication as directed.  BELOW ARE SYMPTOMS THAT SHOULD BE REPORTED IMMEDIATELY: *FEVER GREATER THAN 100.4 F (38 C) OR HIGHER *CHILLS OR SWEATING *NAUSEA AND VOMITING THAT IS NOT CONTROLLED WITH YOUR NAUSEA MEDICATION *UNUSUAL SHORTNESS OF BREATH *UNUSUAL BRUISING OR BLEEDING *URINARY PROBLEMS (pain or burning when urinating, or frequent urination) *BOWEL PROBLEMS (unusual diarrhea, constipation, pain near the anus) TENDERNESS IN MOUTH AND THROAT WITH OR WITHOUT PRESENCE OF ULCERS (sore throat, sores in mouth, or a toothache) UNUSUAL RASH, SWELLING OR PAIN  UNUSUAL VAGINAL DISCHARGE OR ITCHING   Items with * indicate a potential emergency and should be followed up as soon as possible or go to the Emergency Department if any problems should occur.  Please show the CHEMOTHERAPY ALERT CARD or IMMUNOTHERAPY ALERT CARD at check-in to the Emergency Department and triage nurse.  Should you have questions after your visit or need to cancel or reschedule your appointment, please contact [facility] [phone]  and follow the prompts.  Office hours are 8:00 a.m. to 4:30 p.m. Monday - Friday. Please note that voicemails left after 4:00 p.m. may not be returned until the following business day.  We are closed weekends and major holidays. You have access to a nurse at all times for urgent questions. Please call the main number to the clinic [phone] and follow the prompts.  For any non-urgent questions, you may also contact your provider using MyChart. We  now offer e-Visits for anyone 106 and older to request care online for non-urgent symptoms. For details visit [email or web address].   Also download the MyChart app! Go to the app store, search "MyChart", open the app, select [facility], and log in with your MyChart username and password.

## 2022-06-30 NOTE — Progress Notes (Signed)
[facility] [address] [place] [address]    Clinic Day:  07/01/2022  Referring physician: Olin Hauser, MD  Patient Care Team: Olin Hauser, MD as PCP - General Rourk, Cristopher Estimable, MD as Consulting Physician (Gastroenterology)   ASSESSMENT & PLAN:   Assessment: 1.   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  Possible mild nodular infiltration in the left upper quadrant mesentery and anterior omentum.  Multiple small mesenteric root nodes. -PET scan on 09/05/2019 showed 4.2 cm right anterior mesenteric mass with punctate calcifications, SUV 7.2.  Moderate ascites present.  No definite retroperitoneal adenopathy.  Liver is cirrhotic with spleen enlarged.  Diffusely elevated hepatic activity which is nonspecific. -CT-guided biopsy of the mesenteric mass on 10/09/2019 at [facility] consistent with well-differentiated neuroendocrine tumor (carcinoid tumor).  Tumor cells are strongly positive for chromogranin and synaptophysin.  Negative for CK7, CK20, TTF-1, PSA, NKX3.1, S100, HMB-45, and CD56. -Gallium dotatate PET CT scan on 11/08/2019 at [facility] shows large solid mesenteric midline abdomen mass with increased activity measuring 6.5 cm, SUV of 70.  In the uncinate process there is an area of increased activity visualized since seen on series 603 image 91 measuring 13.7 SUV.  Increased activity throughout the liver and spleen limiting evaluation for metastatic disease.  Hepatic activity is heterogeneous. -Lanreotide started on 11/13/2019. - CT CAP (07/11/2021): 4.6 cm soft tissue mass along the mesenteric root.  No significant abdominal adenopathy.  Small volume ascites.  Cirrhosis. - CT CAP (04/14/2022): Stable 4.6 cm soft tissue mass along the mesenteric root.  Cirrhosis.   Moderate volume ascites.  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   2.  Cirrhosis and ascites: -Thought to be secondary to hepatitis C. -Last paracentesis on 08/21/2019 with 4.8 L removed.  Plan: 1.  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  4.6 cm soft tissue mass along the mesenteric root is stable. - Reviewed labs today which showed normal LFTs with slightly elevated total bilirubin.  Creatinine and electrolytes are normal.  CBC was normal.  Chromogranin is 210. - Continue monthly lanreotide.  RTC 3 months with CT AP with contrast and chromogranin level.   2.  Cirrhosis and ascites: - He has completed hepatitis C treatments.    3.  Fluid retention: - Continue furosemide 40 mg daily.  To continue spironolactone 100 mg every other day.  Fluid is well-controlled.  Orders Placed This Encounter  Procedures   CBC with Differential/Platelet    Standing Status:   Future    Standing Expiration Date:   07/01/2023    Order Specific Question:   Release to patient    Answer:   Immediate   Comprehensive metabolic panel    Standing Status:   Future    Standing Expiration Date:   07/01/2023    Order Specific Question:   Release to patient    Answer:   Immediate   Chromogranin A    Standing Status:   Future    Standing Expiration Date:   07/01/2023      I,Katie Daubenspeck,acting as a scribe for Derek Jack, MD.,have documented all relevant  documentation on the behalf of Derek Jack, MD,as directed by  Derek Jack, MD while in the presence of Derek Jack, MD.   I, Derek Jack MD, have reviewed the above documentation for accuracy and completeness, and I agree with the above.   Derek Jack, MD    3/20/20243:53 PM  CHIEF COMPLAINT:   Diagnosis: neuroendocrine tumor    Cancer Staging  No matching staging information was found for the patient.   Prior Therapy: none  Current Therapy:  Lanreotide monthly    HISTORY OF PRESENT ILLNESS:   Oncology History   No history exists.     INTERVAL HISTORY:   Nicolas [name] is a 57 y.o. male presenting to clinic today for follow up of neuroendocrine tumorand mesenteric mass. He was last seen by me on 05/06/22.  Today, he states that he is doing well overall. His appetite level is at 50%. His energy level is at 75%. He denies any bleeding per rectum or melena.  Right-sided lower back pain is stable.   PAST MEDICAL HISTORY:   Past Medical History: Past Medical History:  Diagnosis Date   Cirrhosis ([place])    Hepatitis C    Ab + 07/28/19. RNA 436,000   HTN (hypertension)     Surgical History: Past Surgical History:  Procedure Laterality Date   debridement  Right    per pt, debridement of rt hand    Social History: Social History   Socioeconomic History   Marital status: Divorced    Spouse name: Not on file   Number of children: 4   Years of education: Not on file   Highest education level: Not on file  Occupational History   Occupation: unemployed  Tobacco Use   Smoking status: Never   Smokeless tobacco: Current    Types: Snuff   Tobacco comments:    started using snuff since 1986  Vaping Use   Vaping Use: Never used  Substance and Sexual Activity   Alcohol use: Not Currently    Comment: occasional   Drug use: Never   Sexual activity: Not Currently  Other Topics Concern   Not on file  Social History Narrative   Not on file   Social Determinants of Health   Financial Resource Strain: Low Risk  (08/02/2019)   Overall Financial Resource Strain (CARDIA)    Difficulty of Paying Living Expenses: Not very hard  Food Insecurity: No Food Insecurity (08/02/2019)   Hunger Vital Sign    Worried About Running Out of Food in  the Last Year: Never true    Ran Out of Food in the Last Year: Never true  Transportation Needs: Unmet Transportation Needs (08/02/2019)   PRAPARE - Transportation    Lack of Transportation (Medical): Yes    Lack of Transportation (Non-Medical): Yes  Physical Activity: Inactive (08/02/2019)   Exercise Vital Sign    Days of Exercise per Week: 0 days    Minutes of Exercise per Session: 0 min  Stress: Stress Concern Present (08/02/2019)   [place]    Feeling of Stress : To some extent  Social Connections: Socially Isolated (08/02/2019)   Social Connection and Isolation Panel [NHANES]    Frequency of Communication with Friends and Family: Three times a week    Frequency of Social Gatherings with Friends and Family: Three times a week    Attends Religious Services: Never    Active Member of Clubs or Organizations: No    Attends Club or  Organization Meetings: Never    Marital Status: Divorced  Human resources officer Violence: Not At Risk (08/02/2019)   Humiliation, Afraid, Rape, and Kick questionnaire    Fear of Current or Ex-Partner: No    Emotionally Abused: No    Physically Abused: No    Sexually Abused: No    Family History: Family History  Problem Relation Age of Onset   Cirrhosis Sister        per patient, due to drug use.    Alzheimer's disease Mother    Cancer Mother    Diabetes Mother    Congestive Heart Failure Father    Diabetes Father    Alzheimer's disease Paternal Grandmother    Osteoporosis Paternal Grandmother    Diabetes Daughter    Colon cancer Neg Hx     Current Medications:  Current Outpatient Medications:    clotrimazole (LOTRIMIN) 1 % cream, Apply 1 application topically 2 (two) times daily., Disp: 30 g, Rfl: 3   diphenoxylate-atropine (LOMOTIL) 2.5-0.025 MG tablet, Take 2 tablets after first watery stool, then 1 tablet after each watery stool.  No more than 8 tablets daily., Disp: 60 tablet,  Rfl: 3   Ensure (ENSURE), Take 237 mLs by mouth every evening., Disp: , Rfl:    Hepatitis A-Hep B Recomb Vac (TWINRIX) 720-20 ELU-MCG/ML injection, Inject into the muscle. Pt has last shot in July, Disp: , Rfl:    pantoprazole (PROTONIX) 40 MG tablet, Take 1 tablet (40 mg total) by mouth daily., Disp: 30 tablet, Rfl: 3   propranolol (INDERAL) 20 MG tablet, Take 1 tablet by mouth daily., Disp: , Rfl:    sertraline (ZOLOFT) 50 MG tablet, Take 1 tablet (50 mg total) by mouth daily., Disp: 60 tablet, Rfl: 0   sildenafil (VIAGRA) 50 MG tablet, Take by mouth., Disp: , Rfl:    spironolactone (ALDACTONE) 100 MG tablet, Take one tablet in AM, Disp: 60 tablet, Rfl: 2   furosemide (LASIX) 40 MG tablet, Take 1 tablet (40 mg total) by mouth daily., Disp: 30 tablet, Rfl: 5 No current facility-administered medications for this visit.  Facility-Administered Medications Ordered in Other Visits:    epoetin alfa-epbx (RETACRIT) 09811 UNIT/ML injection, , , ,    epoetin alfa-epbx (RETACRIT) 91478 UNIT/ML injection, , , ,    lanreotide acetate (SOMATULINE DEPOT) 120 MG/0.5ML injection, , , ,    Allergies: No Known Allergies  REVIEW OF SYSTEMS:   Review of Systems  Constitutional:  Negative for chills, fatigue and fever.  HENT:   Negative for lump/mass, mouth sores, nosebleeds, sore throat and trouble swallowing.   Eyes:  Negative for eye problems.  Respiratory:  Negative for cough and shortness of breath.   Cardiovascular:  Negative for chest pain, leg swelling and palpitations.  Gastrointestinal:  Negative for abdominal pain, constipation, diarrhea, nausea and vomiting.  Genitourinary:  Negative for bladder incontinence, difficulty urinating, dysuria, frequency, hematuria and nocturia.   Musculoskeletal:  Positive for back pain. Negative for arthralgias, flank pain, myalgias and neck pain.  Skin:  Negative for itching and rash.  Neurological:  Positive for headaches. Negative for dizziness and numbness.   Hematological:  Does not bruise/bleed easily.  Psychiatric/Behavioral:  Negative for depression, sleep disturbance and suicidal ideas. The patient is not nervous/anxious.   All other systems reviewed and are negative.    VITALS:   Blood pressure 116/81, pulse 77, temperature 98 F (36.7 C), resp. rate 18, weight (!) 301 lb 5.9 oz (136.7 kg), SpO2 98 %.  Wt Readings  from Last 3 Encounters:  07/01/22 (!) 301 lb 5.9 oz (136.7 kg)  05/06/22 (!) 302 lb 12.8 oz (137.3 kg)  04/08/22 (!) 306 lb 9.6 oz (139.1 kg)    Body mass index is 37.87 kg/m.  Performance status (ECOG): 1 - Symptomatic but completely ambulatory  PHYSICAL EXAM:   Physical Exam Vitals and nursing note reviewed. Exam conducted with a chaperone present.  Constitutional:      Appearance: Normal appearance.  Cardiovascular:     Rate and Rhythm: Normal rate and regular rhythm.     Pulses: Normal pulses.     Heart sounds: Normal heart sounds.  Pulmonary:     Effort: Pulmonary effort is normal.     Breath sounds: Normal breath sounds.  Abdominal:     Palpations: Abdomen is soft. There is no hepatomegaly, splenomegaly or mass.     Tenderness: There is no abdominal tenderness.  Musculoskeletal:     Right lower leg: No edema.     Left lower leg: No edema.  Lymphadenopathy:     Cervical: No cervical adenopathy.     Right cervical: No superficial, deep or posterior cervical adenopathy.    Left cervical: No superficial, deep or posterior cervical adenopathy.     Upper Body:     Right upper body: No supraclavicular or axillary adenopathy.     Left upper body: No supraclavicular or axillary adenopathy.  Neurological:     General: No focal deficit present.     Mental Status: He is alert and oriented to person, place, and time.  Psychiatric:        Mood and Affect: Mood normal.        Behavior: Behavior normal.     LABS:      Latest Ref Rng & Units 07/01/2022   12:28 PM 05/06/2022   12:40 PM 04/08/2022   12:39 PM   CBC  WBC 4.0 - 10.5 K/uL 5.0  5.3  5.2   Hemoglobin 13.0 - 17.0 g/dL 14.2  13.1  14.0   Hematocrit 39.0 - 52.0 % 42.9  39.5  42.7   Platelets 150 - 400 K/uL 155  146  164       Latest Ref Rng & Units 07/01/2022   12:28 PM 05/06/2022   12:40 PM 04/08/2022   12:39 PM  CMP  Glucose 70 - 99 mg/dL 110  117  110   BUN 6 - 20 mg/dL 17  15  15    Creatinine 0.61 - 1.24 mg/dL 1.07  1.04  1.03   Sodium 135 - 145 mmol/L 133  135  137   Potassium 3.5 - 5.1 mmol/L 4.0  4.1  4.1   Chloride 98 - 111 mmol/L 99  102  101   CO2 22 - 32 mmol/L 26  25  29    Calcium 8.9 - 10.3 mg/dL 8.4  8.7  9.0   Total Protein 6.5 - 8.1 g/dL 8.1  8.4  8.3   Total Bilirubin 0.3 - 1.2 mg/dL 1.3  1.2  1.6   Alkaline Phos 38 - 126 U/L 117  90  101   AST 15 - 41 U/L 36  31  25   ALT 0 - 44 U/L 22  18  16       Lab Results  Component Value Date   CEA1 1.6 11/13/2019   /  CEA  Date Value Ref Range Status  11/13/2019 1.6 0.0 - 4.7 ng/mL Final    Comment:    (NOTE)  Nonsmokers          <3.9                             Smokers             <5.6 Roche [employer] Electrochemiluminescence Immunoassay (ECLIA) Values obtained with different assay methods or kits cannot be used interchangeably.  Results cannot be interpreted as absolute evidence of the presence or absence of malignant disease. Performed At: [facility] [place], [place] [id number] Rush Farmer MD [id number]    No results found for: "PSA1" No results found for: "CAN199" No results found for: "CAN125"  No results found for: "TOTALPROTELP", "ALBUMINELP", "A1GS", "A2GS", "BETS", "BETA2SER", "GAMS", "MSPIKE", "SPEI" Lab Results  Component Value Date   TIBC 242 (L) 07/28/2019   FERRITIN 166 07/28/2019   IRONPCTSAT 41 (H) 07/28/2019   Lab Results  Component Value Date   LDH 147 11/13/2019   LDH 188 08/02/2019     STUDIES:   No results found.

## 2022-07-01 ENCOUNTER — Inpatient Hospital Stay: Payer: Medicaid Other | Attending: Hematology

## 2022-07-01 ENCOUNTER — Inpatient Hospital Stay (HOSPITAL_BASED_OUTPATIENT_CLINIC_OR_DEPARTMENT_OTHER): Payer: Medicaid Other | Admitting: Hematology

## 2022-07-01 ENCOUNTER — Encounter: Payer: Self-pay | Admitting: Hematology

## 2022-07-01 ENCOUNTER — Inpatient Hospital Stay: Payer: Medicaid Other

## 2022-07-01 DIAGNOSIS — C7A098 Malignant carcinoid tumors of other sites: Secondary | ICD-10-CM | POA: Diagnosis not present

## 2022-07-01 DIAGNOSIS — R188 Other ascites: Secondary | ICD-10-CM

## 2022-07-01 DIAGNOSIS — C7A00 Malignant carcinoid tumor of unspecified site: Secondary | ICD-10-CM

## 2022-07-01 DIAGNOSIS — K7460 Unspecified cirrhosis of liver: Secondary | ICD-10-CM

## 2022-07-01 LAB — COMPREHENSIVE METABOLIC PANEL
ALT: 22 U/L (ref 0–44)
AST: 36 U/L (ref 15–41)
Albumin: 3.1 g/dL — ABNORMAL LOW (ref 3.5–5.0)
Alkaline Phosphatase: 117 U/L (ref 38–126)
Anion gap: 8 (ref 5–15)
BUN: 17 mg/dL (ref 6–20)
CO2: 26 mmol/L (ref 22–32)
Calcium: 8.4 mg/dL — ABNORMAL LOW (ref 8.9–10.3)
Chloride: 99 mmol/L (ref 98–111)
Creatinine, Ser: 1.07 mg/dL (ref 0.61–1.24)
GFR, Estimated: >60 mL/min (ref >60)
Glucose, Bld: 110 mg/dL — ABNORMAL HIGH (ref 70–99)
Potassium: 4 mmol/L (ref 3.5–5.1)
Sodium: 133 mmol/L — ABNORMAL LOW (ref 135–145)
Total Bilirubin: 1.3 mg/dL — ABNORMAL HIGH (ref 0.3–1.2)
Total Protein: 8.1 g/dL (ref 6.5–8.1)

## 2022-07-01 LAB — CBC WITH DIFFERENTIAL/PLATELET
Abs Immature Granulocytes: 0.01 10*3/uL (ref 0.00–0.07)
Basophils Absolute: 0.1 10*3/uL (ref 0.0–0.1)
Basophils Relative: 1 %
Eosinophils Absolute: 0.6 10*3/uL — ABNORMAL HIGH (ref 0.0–0.5)
Eosinophils Relative: 11 %
HCT: 42.9 % (ref 39.0–52.0)
Hemoglobin: 14.2 g/dL (ref 13.0–17.0)
Immature Granulocytes: 0 %
Lymphocytes Relative: 29 %
Lymphs Abs: 1.4 10*3/uL (ref 0.7–4.0)
MCH: 32.2 pg (ref 26.0–34.0)
MCHC: 33.1 g/dL (ref 30.0–36.0)
MCV: 97.3 fL (ref 80.0–100.0)
Monocytes Absolute: 0.4 10*3/uL (ref 0.1–1.0)
Monocytes Relative: 8 %
Neutro Abs: 2.6 10*3/uL (ref 1.7–7.7)
Neutrophils Relative %: 51 %
Platelets: 155 10*3/uL (ref 150–400)
RBC: 4.41 MIL/uL (ref 4.22–5.81)
RDW: 13.9 % (ref 11.5–15.5)
WBC: 5 10*3/uL (ref 4.0–10.5)
nRBC: 0 % (ref 0.0–0.2)

## 2022-07-01 LAB — MAGNESIUM: Magnesium: 1.9 mg/dL (ref 1.7–2.4)

## 2022-07-01 MED ORDER — LANREOTIDE ACETATE 120 MG/0.5ML ~~LOC~~ SOLN
120.0000 mg | Freq: Once | SUBCUTANEOUS | Status: AC
  Administered 2022-07-01: 120 mg via SUBCUTANEOUS
  Filled 2022-07-01: qty 120

## 2022-07-01 MED ORDER — FUROSEMIDE 40 MG PO TABS: 40.0000 mg | ORAL_TABLET | Freq: Every day | ORAL | 5 refills | Status: DC

## 2022-07-01 NOTE — Progress Notes (Signed)
Patient has been assessed, vital signs and labs have been reviewed by Dr. Delton Coombes. ANC, Creatinine, LFTs, and Platelets are within treatment parameters per Dr. Delton Coombes. The patient is good to proceed with lanreotide treatment at this time.  Primary RN and pharmacy aware.

## 2022-07-01 NOTE — Patient Instructions (Signed)
[facility]  Discharge Instructions  You were seen and examined today by Dr. Delton Coombes.  Dr. Delton Coombes discussed your most recent lab work which revealed that everything looks good.  Dr. Delton Coombes is going to order CT abdomen and pelvis to be done before your next appointment in 3 months.  Continue getting the lanreotide monthly.  Follow-up as scheduled in 3 months with labs.    Thank you for choosing [facility] to provide your oncology and hematology care.   To afford each patient quality time with our provider, please arrive at least 15 minutes before your scheduled appointment time. You may need to reschedule your appointment if you arrive late (10 or more minutes). Arriving late affects you and other patients whose appointments are after yours.  Also, if you miss three or more appointments without notifying the office, you may be dismissed from the clinic at the provider's discretion.    Again, thank you for choosing [facility].  Our hope is that these requests will decrease the amount of time that you wait before being seen by our physicians.   If you have a lab appointment with the [facility] please come in thru the Main Entrance and check in at the main information desk.           _____________________________________________________________  Should you have questions after your visit to [facility], please contact our office at [phone] and follow the prompts.  Our office hours are 8:00 a.m. to 4:30 p.m. Monday - Thursday and 8:00 a.m. to 2:30 p.m. Friday.  Please note that voicemails left after 4:00 p.m. may not be returned until the following business day.  We are closed weekends and all major holidays.  You do have access to a nurse 24-7, just call the main number to the clinic [phone] and do not press any options, hold on the line and a nurse will answer the phone.    For  prescription refill requests, have your pharmacy contact our office and allow 72 hours.    Masks are optional in the cancer centers. If you would like for your care team to wear a mask while they are taking care of you, please let them know. You may have one support person who is at least 57 years old accompany you for your appointments.

## 2022-07-01 NOTE — Progress Notes (Signed)
Patient presents today for Lanreotide injection per providers order.  Vital signs and labs reviewed by MD.    Stable during administration without incident; injection site WNL; see MAR for injection details.  Patient tolerated procedure well and without incident.  No questions or complaints noted at this time. Discharge from clinic ambulatory in stable condition.  Alert and oriented X 3.  Follow up with [facility] as scheduled.

## 2022-07-01 NOTE — Patient Instructions (Signed)
[facility]  Discharge Instructions: Thank you for choosing [facility] to provide your oncology and hematology care.  If you have a lab appointment with the Cancer Center, please come in thru the Main Entrance and check in at the main information desk.  Wear comfortable clothing and clothing appropriate for easy access to any Portacath or PICC line.   We strive to give you quality time with your provider. You may need to reschedule your appointment if you arrive late (15 or more minutes).  Arriving late affects you and other patients whose appointments are after yours.  Also, if you miss three or more appointments without notifying the office, you may be dismissed from the clinic at the provider's discretion.      For prescription refill requests, have your pharmacy contact our office and allow 72 hours for refills to be completed.    Today you received the following chemotherapy and/or immunotherapy agents Lanreotide      To help prevent nausea and vomiting after your treatment, we encourage you to take your nausea medication as directed.  BELOW ARE SYMPTOMS THAT SHOULD BE REPORTED IMMEDIATELY: *FEVER GREATER THAN 100.4 F (38 C) OR HIGHER *CHILLS OR SWEATING *NAUSEA AND VOMITING THAT IS NOT CONTROLLED WITH YOUR NAUSEA MEDICATION *UNUSUAL SHORTNESS OF BREATH *UNUSUAL BRUISING OR BLEEDING *URINARY PROBLEMS (pain or burning when urinating, or frequent urination) *BOWEL PROBLEMS (unusual diarrhea, constipation, pain near the anus) TENDERNESS IN MOUTH AND THROAT WITH OR WITHOUT PRESENCE OF ULCERS (sore throat, sores in mouth, or a toothache) UNUSUAL RASH, SWELLING OR PAIN  UNUSUAL VAGINAL DISCHARGE OR ITCHING   Items with * indicate a potential emergency and should be followed up as soon as possible or go to the Emergency Department if any problems should occur.  Please show the CHEMOTHERAPY ALERT CARD or IMMUNOTHERAPY ALERT CARD at check-in to the  Emergency Department and triage nurse.  Should you have questions after your visit or need to cancel or reschedule your appointment, please contact [facility] [phone]  and follow the prompts.  Office hours are 8:00 a.m. to 4:30 p.m. Monday - Friday. Please note that voicemails left after 4:00 p.m. may not be returned until the following business day.  We are closed weekends and major holidays. You have access to a nurse at all times for urgent questions. Please call the main number to the clinic [phone] and follow the prompts.  For any non-urgent questions, you may also contact your provider using MyChart. We now offer e-Visits for anyone 18 and older to request care online for non-urgent symptoms. For details visit [email or web address].   Also download the MyChart app! Go to the app store, search "MyChart", open the app, select [facility], and log in with your MyChart username and password.   

## 2022-07-03 LAB — CHROMOGRANIN A: Chromogranin A (ng/mL): 170.9 ng/mL — ABNORMAL HIGH (ref 0.0–101.8)

## 2022-07-20 ENCOUNTER — Encounter (HOSPITAL_COMMUNITY): Payer: Self-pay | Admitting: Hematology

## 2022-07-29 ENCOUNTER — Inpatient Hospital Stay: Payer: Medicaid Other | Attending: Hematology

## 2022-07-29 VITALS — BP 118/72 | HR 63 | Temp 97.9°F | Resp 20 | Wt 309.8 lb

## 2022-07-29 DIAGNOSIS — C7A00 Malignant carcinoid tumor of unspecified site: Secondary | ICD-10-CM

## 2022-07-29 DIAGNOSIS — C7A098 Malignant carcinoid tumors of other sites: Secondary | ICD-10-CM | POA: Insufficient documentation

## 2022-07-29 MED ORDER — LANREOTIDE ACETATE 120 MG/0.5ML ~~LOC~~ SOLN
120.0000 mg | Freq: Once | SUBCUTANEOUS | Status: AC
  Administered 2022-07-29: 120 mg via SUBCUTANEOUS
  Filled 2022-07-29: qty 120

## 2022-07-29 NOTE — Patient Instructions (Signed)
[facility]  Discharge Instructions: Thank you for choosing [facility] to provide your oncology and hematology care.  If you have a lab appointment with the Cancer Center - please note that after April 8th, 2024, all labs will be drawn in the cancer center.  You do not have to check in or register with the main entrance as you have in the past but will complete your check-in in the cancer center.  Wear comfortable clothing and clothing appropriate for easy access to any Portacath or PICC line.   We strive to give you quality time with your provider. You may need to reschedule your appointment if you arrive late (15 or more minutes).  Arriving late affects you and other patients whose appointments are after yours.  Also, if you miss three or more appointments without notifying the office, you may be dismissed from the clinic at the provider's discretion.      For prescription refill requests, have your pharmacy contact our office and allow 72 hours for refills to be completed.    Today you received the following Lanreotide, return as scheduled.   To help prevent nausea and vomiting after your treatment, we encourage you to take your nausea medication as directed.  BELOW ARE SYMPTOMS THAT SHOULD BE REPORTED IMMEDIATELY: *FEVER GREATER THAN 100.4 F (38 C) OR HIGHER *CHILLS OR SWEATING *NAUSEA AND VOMITING THAT IS NOT CONTROLLED WITH YOUR NAUSEA MEDICATION *UNUSUAL SHORTNESS OF BREATH *UNUSUAL BRUISING OR BLEEDING *URINARY PROBLEMS (pain or burning when urinating, or frequent urination) *BOWEL PROBLEMS (unusual diarrhea, constipation, pain near the anus) TENDERNESS IN MOUTH AND THROAT WITH OR WITHOUT PRESENCE OF ULCERS (sore throat, sores in mouth, or a toothache) UNUSUAL RASH, SWELLING OR PAIN  UNUSUAL VAGINAL DISCHARGE OR ITCHING   Items with * indicate a potential emergency and should be followed up as soon as possible or go to the Emergency Department  if any problems should occur.  Please show the CHEMOTHERAPY ALERT CARD or IMMUNOTHERAPY ALERT CARD at check-in to the Emergency Department and triage nurse.  Should you have questions after your visit or need to cancel or reschedule your appointment, please contact [facility] [phone]  and follow the prompts.  Office hours are 8:00 a.m. to 4:30 p.m. Monday - Friday. Please note that voicemails left after 4:00 p.m. may not be returned until the following business day.  We are closed weekends and major holidays. You have access to a nurse at all times for urgent questions. Please call the main number to the clinic [phone] and follow the prompts.  For any non-urgent questions, you may also contact your provider using MyChart. We now offer e-Visits for anyone 18 and older to request care online for non-urgent symptoms. For details visit [email or web address].   Also download the MyChart app! Go to the app store, search "MyChart", open the app, select , and log in with your MyChart username and password.   

## 2022-07-29 NOTE — Progress Notes (Signed)
Patient tolerated injection with no complaints voiced. Site clean and dry with no bruising or swelling noted at site. See MAR for details. Band aid applied.  Patient stable during and after injection. VSS with discharge and left in satisfactory condition with no s/s of distress noted.  

## 2022-08-26 ENCOUNTER — Inpatient Hospital Stay: Payer: Medicaid Other | Attending: Hematology

## 2022-08-26 ENCOUNTER — Other Ambulatory Visit: Payer: Self-pay | Admitting: *Deleted

## 2022-08-26 VITALS — BP 139/88 | HR 67 | Temp 97.9°F | Resp 18 | Wt 303.4 lb

## 2022-08-26 DIAGNOSIS — C7A098 Malignant carcinoid tumors of other sites: Secondary | ICD-10-CM | POA: Insufficient documentation

## 2022-08-26 DIAGNOSIS — C7A00 Malignant carcinoid tumor of unspecified site: Secondary | ICD-10-CM

## 2022-08-26 DIAGNOSIS — K6389 Other specified diseases of intestine: Secondary | ICD-10-CM

## 2022-08-26 MED ORDER — LANREOTIDE ACETATE 120 MG/0.5ML ~~LOC~~ SOLN
120.0000 mg | Freq: Once | SUBCUTANEOUS | Status: AC
  Administered 2022-08-26: 120 mg via SUBCUTANEOUS
  Filled 2022-08-26: qty 120

## 2022-08-26 NOTE — Patient Instructions (Signed)
[facility]  Discharge Instructions: Thank you for choosing [facility] to provide your oncology and hematology care.  If you have a lab appointment with the Cancer Center - please note that after April 8th, 2024, all labs will be drawn in the cancer center.  You do not have to check in or register with the main entrance as you have in the past but will complete your check-in in the cancer center.  Wear comfortable clothing and clothing appropriate for easy access to any Portacath or PICC line.   We strive to give you quality time with your provider. You may need to reschedule your appointment if you arrive late (15 or more minutes).  Arriving late affects you and other patients whose appointments are after yours.  Also, if you miss three or more appointments without notifying the office, you may be dismissed from the clinic at the provider's discretion.      For prescription refill requests, have your pharmacy contact our office and allow 72 hours for refills to be completed.    Today you received the following Lanreotide, return as scheduled.   To help prevent nausea and vomiting after your treatment, we encourage you to take your nausea medication as directed.  BELOW ARE SYMPTOMS THAT SHOULD BE REPORTED IMMEDIATELY: *FEVER GREATER THAN 100.4 F (38 C) OR HIGHER *CHILLS OR SWEATING *NAUSEA AND VOMITING THAT IS NOT CONTROLLED WITH YOUR NAUSEA MEDICATION *UNUSUAL SHORTNESS OF BREATH *UNUSUAL BRUISING OR BLEEDING *URINARY PROBLEMS (pain or burning when urinating, or frequent urination) *BOWEL PROBLEMS (unusual diarrhea, constipation, pain near the anus) TENDERNESS IN MOUTH AND THROAT WITH OR WITHOUT PRESENCE OF ULCERS (sore throat, sores in mouth, or a toothache) UNUSUAL RASH, SWELLING OR PAIN  UNUSUAL VAGINAL DISCHARGE OR ITCHING   Items with * indicate a potential emergency and should be followed up as soon as possible or go to the Emergency Department  if any problems should occur.  Please show the CHEMOTHERAPY ALERT CARD or IMMUNOTHERAPY ALERT CARD at check-in to the Emergency Department and triage nurse.  Should you have questions after your visit or need to cancel or reschedule your appointment, please contact [facility] [phone]  and follow the prompts.  Office hours are 8:00 a.m. to 4:30 p.m. Monday - Friday. Please note that voicemails left after 4:00 p.m. may not be returned until the following business day.  We are closed weekends and major holidays. You have access to a nurse at all times for urgent questions. Please call the main number to the clinic [phone] and follow the prompts.  For any non-urgent questions, you may also contact your provider using MyChart. We now offer e-Visits for anyone 18 and older to request care online for non-urgent symptoms. For details visit [email or web address].   Also download the MyChart app! Go to the app store, search "MyChart", open the app, select [facility], and log in with your MyChart username and password.   

## 2022-08-26 NOTE — Progress Notes (Signed)
Patient tolerated injection with no complaints voiced. Site clean and dry with no bruising or swelling noted at site. See MAR for details. Band aid applied.  Patient stable during and after injection. VSS with discharge and left in satisfactory condition with no s/s of distress noted.  

## 2022-09-23 ENCOUNTER — Inpatient Hospital Stay (HOSPITAL_BASED_OUTPATIENT_CLINIC_OR_DEPARTMENT_OTHER): Payer: Medicaid Other | Admitting: Hematology

## 2022-09-23 ENCOUNTER — Inpatient Hospital Stay: Payer: Medicaid Other | Attending: Hematology

## 2022-09-23 ENCOUNTER — Inpatient Hospital Stay: Payer: Medicaid Other

## 2022-09-23 VITALS — BP 115/86 | HR 66 | Temp 98.5°F | Resp 18 | Wt 297.4 lb

## 2022-09-23 DIAGNOSIS — R188 Other ascites: Secondary | ICD-10-CM

## 2022-09-23 DIAGNOSIS — C7A098 Malignant carcinoid tumors of other sites: Secondary | ICD-10-CM | POA: Diagnosis present

## 2022-09-23 DIAGNOSIS — K7460 Unspecified cirrhosis of liver: Secondary | ICD-10-CM

## 2022-09-23 DIAGNOSIS — C7A00 Malignant carcinoid tumor of unspecified site: Secondary | ICD-10-CM | POA: Diagnosis not present

## 2022-09-23 LAB — CBC WITH DIFFERENTIAL/PLATELET
Abs Immature Granulocytes: 0.01 10*3/uL (ref 0.00–0.07)
Basophils Absolute: 0.1 10*3/uL (ref 0.0–0.1)
Basophils Relative: 1 %
Eosinophils Absolute: 0.5 10*3/uL (ref 0.0–0.5)
Eosinophils Relative: 9 %
HCT: 42.8 % (ref 39.0–52.0)
Hemoglobin: 14 g/dL (ref 13.0–17.0)
Immature Granulocytes: 0 %
Lymphocytes Relative: 25 %
Lymphs Abs: 1.3 10*3/uL (ref 0.7–4.0)
MCH: 31.7 pg (ref 26.0–34.0)
MCHC: 32.7 g/dL (ref 30.0–36.0)
MCV: 96.8 fL (ref 80.0–100.0)
Monocytes Absolute: 0.4 10*3/uL (ref 0.1–1.0)
Monocytes Relative: 8 %
Neutro Abs: 2.8 10*3/uL (ref 1.7–7.7)
Neutrophils Relative %: 57 %
Platelets: 127 10*3/uL — ABNORMAL LOW (ref 150–400)
RBC: 4.42 MIL/uL (ref 4.22–5.81)
RDW: 13.8 % (ref 11.5–15.5)
WBC: 5 10*3/uL (ref 4.0–10.5)
nRBC: 0 % (ref 0.0–0.2)

## 2022-09-23 LAB — COMPREHENSIVE METABOLIC PANEL
ALT: 21 U/L (ref 0–44)
AST: 27 U/L (ref 15–41)
Albumin: 3.2 g/dL — ABNORMAL LOW (ref 3.5–5.0)
Alkaline Phosphatase: 107 U/L (ref 38–126)
Anion gap: 7 (ref 5–15)
BUN: 17 mg/dL (ref 6–20)
CO2: 27 mmol/L (ref 22–32)
Calcium: 8.9 mg/dL (ref 8.9–10.3)
Chloride: 103 mmol/L (ref 98–111)
Creatinine, Ser: 1.13 mg/dL (ref 0.61–1.24)
GFR, Estimated: >60 mL/min (ref >60)
Glucose, Bld: 121 mg/dL — ABNORMAL HIGH (ref 70–99)
Potassium: 4.5 mmol/L (ref 3.5–5.1)
Sodium: 137 mmol/L (ref 135–145)
Total Bilirubin: 1.3 mg/dL — ABNORMAL HIGH (ref 0.3–1.2)
Total Protein: 8.3 g/dL — ABNORMAL HIGH (ref 6.5–8.1)

## 2022-09-23 MED ORDER — LANREOTIDE ACETATE 120 MG/0.5ML ~~LOC~~ SOLN
120.0000 mg | Freq: Once | SUBCUTANEOUS | Status: AC
  Administered 2022-09-23: 120 mg via SUBCUTANEOUS
  Filled 2022-09-23: qty 120

## 2022-09-23 NOTE — Patient Instructions (Addendum)
[facility]  Discharge Instructions  You were seen and examined today by Dr. Ellin Saba.  Dr. Ellin Saba discussed your most recent lab work which revealed everything looks good and stable.   Continue getting your lanreotide injection.  Follow-up as scheduled in 3 months.    Thank you for choosing [facility] to provide your oncology and hematology care.   To afford each patient quality time with our provider, please arrive at least 15 minutes before your scheduled appointment time. You may need to reschedule your appointment if you arrive late (10 or more minutes). Arriving late affects you and other patients whose appointments are after yours.  Also, if you miss three or more appointments without notifying the office, you may be dismissed from the clinic at the provider's discretion.    Again, thank you for choosing [facility].  Our hope is that these requests will decrease the amount of time that you wait before being seen by our physicians.   If you have a lab appointment with the Cancer Center - please note that after April 8th, all labs will be drawn in the cancer center.  You do not have to check in or register with the main entrance as you have in the past but will complete your check-in at the cancer center.            _____________________________________________________________  Should you have questions after your visit to [facility], please contact our office at [phone] and follow the prompts.  Our office hours are 8:00 a.m. to 4:30 p.m. Monday - Thursday and 8:00 a.m. to 2:30 p.m. Friday.  Please note that voicemails left after 4:00 p.m. may not be returned until the following business day.  We are closed weekends and all major holidays.  You do have access to a nurse 24-7, just call the main number to the clinic [phone] and do not press any options, hold on the line and a nurse will  answer the phone.    For prescription refill requests, have your pharmacy contact our office and allow 72 hours.    Masks are no longer required in the cancer centers. If you would like for your care team to wear a mask while they are taking care of you, please let them know. You may have one support person who is at least 57 years old accompany you for your appointments.

## 2022-09-23 NOTE — Patient Instructions (Signed)
[facility]  Discharge Instructions: Thank you for choosing [facility] to provide your oncology and hematology care.  If you have a lab appointment with the Cancer Center - please note that after April 8th, 2024, all labs will be drawn in the cancer center.  You do not have to check in or register with the main entrance as you have in the past but will complete your check-in in the cancer center.  Wear comfortable clothing and clothing appropriate for easy access to any Portacath or PICC line.   We strive to give you quality time with your provider. You may need to reschedule your appointment if you arrive late (15 or more minutes).  Arriving late affects you and other patients whose appointments are after yours.  Also, if you miss three or more appointments without notifying the office, you may be dismissed from the clinic at the provider's discretion.      For prescription refill requests, have your pharmacy contact our office and allow 72 hours for refills to be completed.    Today you received the following lanreotide, return as scheduled.   To help prevent nausea and vomiting after your treatment, we encourage you to take your nausea medication as directed.  BELOW ARE SYMPTOMS THAT SHOULD BE REPORTED IMMEDIATELY: *FEVER GREATER THAN 100.4 F (38 C) OR HIGHER *CHILLS OR SWEATING *NAUSEA AND VOMITING THAT IS NOT CONTROLLED WITH YOUR NAUSEA MEDICATION *UNUSUAL SHORTNESS OF BREATH *UNUSUAL BRUISING OR BLEEDING *URINARY PROBLEMS (pain or burning when urinating, or frequent urination) *BOWEL PROBLEMS (unusual diarrhea, constipation, pain near the anus) TENDERNESS IN MOUTH AND THROAT WITH OR WITHOUT PRESENCE OF ULCERS (sore throat, sores in mouth, or a toothache) UNUSUAL RASH, SWELLING OR PAIN  UNUSUAL VAGINAL DISCHARGE OR ITCHING   Items with * indicate a potential emergency and should be followed up as soon as possible or go to the Emergency Department  if any problems should occur.  Please show the CHEMOTHERAPY ALERT CARD or IMMUNOTHERAPY ALERT CARD at check-in to the Emergency Department and triage nurse.  Should you have questions after your visit or need to cancel or reschedule your appointment, please contact [facility] [phone]  and follow the prompts.  Office hours are 8:00 a.m. to 4:30 p.m. Monday - Friday. Please note that voicemails left after 4:00 p.m. may not be returned until the following business day.  We are closed weekends and major holidays. You have access to a nurse at all times for urgent questions. Please call the main number to the clinic [phone] and follow the prompts.  For any non-urgent questions, you may also contact your provider using MyChart. We now offer e-Visits for anyone 18 and older to request care online for non-urgent symptoms. For details visit [email or web address].   Also download the MyChart app! Go to the app store, search "MyChart", open the app, select [facility], and log in with your MyChart username and password.   

## 2022-09-23 NOTE — Progress Notes (Signed)
[name] [address], [place] [address]    Clinic Day:  10/23/2022  Referring physician: Evie Lacks, MD  Patient Care Team: Evie Lacks, MD as PCP - General Rourk, Gerrit Friends, MD as Consulting Physician (Gastroenterology)   ASSESSMENT & PLAN:   Assessment: 1.   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  Possible mild nodular infiltration in the left upper quadrant mesentery and anterior omentum.  Multiple small mesenteric root nodes. -PET scan on 09/05/2019 showed 4.2 cm right anterior mesenteric mass with punctate calcifications, SUV 7.2.  Moderate ascites present.  No definite retroperitoneal adenopathy.  Liver is cirrhotic with spleen enlarged.  Diffusely elevated hepatic activity which is nonspecific. -CT-guided biopsy of the mesenteric mass on 10/09/2019 at [facility] consistent with well-differentiated neuroendocrine tumor (carcinoid tumor).  Tumor cells are strongly positive for chromogranin and synaptophysin.  Negative for CK7, CK20, TTF-1, PSA, NKX3.1, S100, HMB-45, and CD56. -Gallium dotatate PET CT scan on 11/08/2019 at [name] shows large solid mesenteric midline abdomen mass with increased activity measuring 6.5 cm, SUV of 70.  In the uncinate process there is an area of increased activity visualized since seen on series 603 image 91 measuring 13.7 SUV.  Increased activity throughout the liver and spleen limiting evaluation for metastatic disease.  Hepatic activity is heterogeneous. -Lanreotide started on 11/13/2019. - CT CAP (07/11/2021): 4.6 cm soft tissue mass along the mesenteric root.  No significant abdominal adenopathy.  Small volume ascites.  Cirrhosis. - CT CAP (04/14/2022): Stable 4.6 cm soft tissue mass along the mesenteric root.  Cirrhosis.   Moderate volume ascites.  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   2.  Cirrhosis and ascites: -Thought to be secondary to hepatitis C. -Last paracentesis on 08/21/2019 with 4.8 L removed.    Plan: 1.  Well differentiated neuroendocrine tumor of the mesenteric mass: - He will have CT CAP done on 09/29/2022 in [place]. - Denies any flushing wheezing or diarrhea. - Reviewed labs today: Normal LFTs with elevated total bilirubin.  Creatinine normal.  CBC grossly normal with mild thrombocytopenia stable. - Chromogranin level was stable at 170. - Continue monthly lanreotide.  RTC 3 months for follow-up.   2.  Cirrhosis and ascites: - He has completed hep C treatment.    3.  Fluid retention: - Continue furosemide 40 mg daily as needed.  Continue spironolactone 100 mg every other day as needed.    Orders Placed This Encounter  Procedures   CBC with Differential/Platelet    Standing Status:   Future    Standing Expiration Date:   09/23/2023    Order Specific Question:   Release to patient    Answer:   Immediate   Comprehensive metabolic panel    Standing Status:   Future    Standing Expiration Date:   09/23/2023    Order Specific Question:   Release to patient    Answer:   Immediate   Magnesium    Standing Status:   Future    Standing Expiration Date:   09/23/2023    Order Specific Question:   Release to patient    Answer:   Immediate   Chromogranin A    Standing Status:   Future    Standing Expiration Date:   09/23/2023  I,Katie Daubenspeck,acting as a Neurosurgeon for Doreatha Massed, MD.,have documented all relevant documentation on the behalf of Doreatha Massed, MD,as directed by  Doreatha Massed, MD while in the presence of Doreatha Massed, MD.   I, Doreatha Massed MD, have reviewed the above documentation for accuracy and completeness, and I agree with the above.   Doreatha Massed, MD   7/12/20245:59 PM  CHIEF COMPLAINT:   Diagnosis: neuroendocrine tumor    Cancer Staging  No matching staging information was found for the patient.    Prior Therapy: none  Current Therapy:  Lanreotide monthly    HISTORY OF PRESENT ILLNESS:   Oncology History   No history exists.     INTERVAL HISTORY:   Nicolas [name] is a 57 y.o. male presenting to clinic today for follow up of neuroendocrine tumor. He was last seen by me on 07/01/22.  Today, he states that he is doing well overall. His appetite level is at 85%. His energy level is at 80%.  PAST MEDICAL HISTORY:   Past Medical History: Past Medical History:  Diagnosis Date   Cirrhosis (HCC)    Hepatitis C    Ab + 07/28/19. RNA 436,000   HTN (hypertension)     Surgical History: Past Surgical History:  Procedure Laterality Date   debridement  Right    per pt, debridement of rt hand    Social History: Social History   Socioeconomic History   Marital status: Divorced    Spouse name: Not on file   Number of children: 4   Years of education: Not on file   Highest education level: Not on file  Occupational History   Occupation: unemployed  Tobacco Use   Smoking status: Never   Smokeless tobacco: Current    Types: Snuff   Tobacco comments:    started using snuff since 1986  Vaping Use   Vaping status: Never Used  Substance and Sexual Activity   Alcohol use: Not Currently    Comment: occasional   Drug use: Never   Sexual activity: Not Currently  Other Topics Concern   Not on file  Social History Narrative   Not on file   Social Determinants of Health   Financial Resource Strain: Low Risk  (08/02/2019)   Overall Financial Resource Strain (CARDIA)    Difficulty of Paying Living Expenses: Not very hard  Food Insecurity: No Food Insecurity (08/02/2019)   Hunger Vital Sign    Worried About Running Out of Food in the Last Year: Never true    Ran Out of Food in the Last Year: Never true   Transportation Needs: Unmet Transportation Needs (08/02/2019)   PRAPARE - Transportation    Lack of Transportation (Medical): Yes    Lack of Transportation (Non-Medical): Yes  Physical Activity: Inactive (08/02/2019)   Exercise Vital Sign    Days of Exercise per Week: 0 days    Minutes of Exercise per Session: 0 min  Stress: Stress Concern Present (08/02/2019)   [employer] of Occupational Health - Occupational Stress Questionnaire    Feeling of Stress : To some extent  Social Connections: Socially Isolated (08/02/2019)   Social Connection and Isolation Panel [NHANES]    Frequency of Communication with Friends and Family: Three times a week    Frequency of Social Gatherings with Friends and Family: Three times a week    Attends Religious Services: Never    Active Member of Clubs or Organizations: No    Attends Banker Meetings: Never  Marital Status: Divorced  Catering manager Violence: Not At Risk (08/02/2019)   Humiliation, Afraid, Rape, and Kick questionnaire    Fear of Current or Ex-Partner: No    Emotionally Abused: No    Physically Abused: No    Sexually Abused: No    Family History: Family History  Problem Relation Age of Onset   Cirrhosis Sister        per patient, due to drug use.    Alzheimer's disease Mother    Cancer Mother    Diabetes Mother    Congestive Heart Failure Father    Diabetes Father    Alzheimer's disease Paternal Grandmother    Osteoporosis Paternal Grandmother    Diabetes Daughter    Colon cancer Neg Hx     Current Medications:  Current Outpatient Medications:    clotrimazole (LOTRIMIN) 1 % cream, Apply 1 application topically 2 (two) times daily., Disp: 30 g, Rfl: 3   diphenoxylate-atropine (LOMOTIL) 2.5-0.025 MG tablet, Take 2 tablets after first watery stool, then 1 tablet after each watery stool.  No more than 8 tablets daily., Disp: 60 tablet, Rfl: 3   Ensure (ENSURE), Take 237 mLs by mouth every evening., Disp: , Rfl:     furosemide (LASIX) 40 MG tablet, Take 1 tablet (40 mg total) by mouth daily., Disp: 30 tablet, Rfl: 5   Hepatitis A-Hep B Recomb Vac (TWINRIX) 720-20 ELU-MCG/ML injection, Inject into the muscle. Pt has last shot in July, Disp: , Rfl:    pantoprazole (PROTONIX) 40 MG tablet, Take 1 tablet (40 mg total) by mouth daily., Disp: 30 tablet, Rfl: 3   propranolol (INDERAL) 20 MG tablet, Take 1 tablet by mouth daily., Disp: , Rfl:    sertraline (ZOLOFT) 50 MG tablet, Take 1 tablet (50 mg total) by mouth daily., Disp: 60 tablet, Rfl: 0   sildenafil (VIAGRA) 50 MG tablet, Take by mouth., Disp: , Rfl:    spironolactone (ALDACTONE) 100 MG tablet, Take one tablet in AM, Disp: 60 tablet, Rfl: 2 No current facility-administered medications for this visit.  Facility-Administered Medications Ordered in Other Visits:    epoetin alfa-epbx (RETACRIT) 29562 UNIT/ML injection, , , ,    epoetin alfa-epbx (RETACRIT) 13086 UNIT/ML injection, , , ,    lanreotide acetate (SOMATULINE DEPOT) 120 MG/0.5ML injection, , , ,    Allergies: No Known Allergies  REVIEW OF SYSTEMS:   Review of Systems  Constitutional:  Negative for chills, fatigue and fever.  HENT:   Negative for lump/mass, mouth sores, nosebleeds, sore throat and trouble swallowing.   Eyes:  Negative for eye problems.  Respiratory:  Negative for cough and shortness of breath.   Cardiovascular:  Negative for chest pain, leg swelling and palpitations.  Gastrointestinal:  Negative for abdominal pain, constipation, diarrhea, nausea and vomiting.  Genitourinary:  Negative for bladder incontinence, difficulty urinating, dysuria, frequency, hematuria and nocturia.   Musculoskeletal:  Negative for arthralgias, back pain, flank pain, myalgias and neck pain.  Skin:  Negative for itching and rash.  Neurological:  Negative for dizziness, headaches and numbness.  Hematological:  Does not bruise/bleed easily.  Psychiatric/Behavioral:  Negative for depression, sleep  disturbance and suicidal ideas. The patient is not nervous/anxious.   All other systems reviewed and are negative.    VITALS:   Blood pressure 115/86, pulse 66, temperature 98.5 F (36.9 C), temperature source Oral, resp. rate 18, weight 297 lb 6.4 oz (134.9 kg), SpO2 100%.  Wt Readings from Last 3 Encounters:  09/23/22 297 lb 6.4  oz (134.9 kg)  08/26/22 (!) 303 lb 6.4 oz (137.6 kg)  07/29/22 (!) 309 lb 12.8 oz (140.5 kg)    Body mass index is 37.37 kg/m.  Performance status (ECOG): 1 - Symptomatic but completely ambulatory  PHYSICAL EXAM:   Physical Exam Vitals and nursing note reviewed. Exam conducted with a chaperone present.  Constitutional:      Appearance: Normal appearance.  Cardiovascular:     Rate and Rhythm: Normal rate and regular rhythm.     Pulses: Normal pulses.     Heart sounds: Normal heart sounds.  Pulmonary:     Effort: Pulmonary effort is normal.     Breath sounds: Normal breath sounds.  Abdominal:     Palpations: Abdomen is soft. There is no hepatomegaly, splenomegaly or mass.     Tenderness: There is no abdominal tenderness.  Musculoskeletal:     Right lower leg: No edema.     Left lower leg: No edema.  Lymphadenopathy:     Cervical: No cervical adenopathy.     Right cervical: No superficial, deep or posterior cervical adenopathy.    Left cervical: No superficial, deep or posterior cervical adenopathy.     Upper Body:     Right upper body: No supraclavicular or axillary adenopathy.     Left upper body: No supraclavicular or axillary adenopathy.  Neurological:     General: No focal deficit present.     Mental Status: He is alert and oriented to person, place, and time.  Psychiatric:        Mood and Affect: Mood normal.        Behavior: Behavior normal.     LABS:      Latest Ref Rng & Units 09/23/2022   12:34 PM 07/01/2022   12:28 PM 05/06/2022   12:40 PM  CBC  WBC 4.0 - 10.5 K/uL 5.0  5.0  5.3   Hemoglobin 13.0 - 17.0 g/dL 16.1  09.6   04.5   Hematocrit 39.0 - 52.0 % 42.8  42.9  39.5   Platelets 150 - 400 K/uL 127  155  146       Latest Ref Rng & Units 09/23/2022   12:34 PM 07/01/2022   12:28 PM 05/06/2022   12:40 PM  CMP  Glucose 70 - 99 mg/dL 409  811  914   BUN 6 - 20 mg/dL 17  17  15    Creatinine 0.61 - 1.24 mg/dL 7.82  9.56  2.13   Sodium 135 - 145 mmol/L 137  133  135   Potassium 3.5 - 5.1 mmol/L 4.5  4.0  4.1   Chloride 98 - 111 mmol/L 103  99  102   CO2 22 - 32 mmol/L 27  26  25    Calcium 8.9 - 10.3 mg/dL 8.9  8.4  8.7   Total Protein 6.5 - 8.1 g/dL 8.3  8.1  8.4   Total Bilirubin 0.3 - 1.2 mg/dL 1.3  1.3  1.2   Alkaline Phos 38 - 126 U/L 107  117  90   AST 15 - 41 U/L 27  36  31   ALT 0 - 44 U/L 21  22  18       Lab Results  Component Value Date   CEA1 1.6 11/13/2019   /  CEA  Date Value Ref Range Status  11/13/2019 1.6 0.0 - 4.7 ng/mL Final    Comment:    (NOTE)  Nonsmokers          <3.9                             Smokers             <5.6 Roche [employer] Electrochemiluminescence Immunoassay (ECLIA) Values obtained with different assay methods or kits cannot be used interchangeably.  Results cannot be interpreted as absolute evidence of the presence or absence of malignant disease. Performed At: [name] [address] [place], [place] [id number] Jolene Schimke MD WU:[id number]    No results found for: "PSA1" No results found for: "CAN199" No results found for: "CAN125"  No results found for: "TOTALPROTELP", "ALBUMINELP", "A1GS", "A2GS", "BETS", "BETA2SER", "GAMS", "MSPIKE", "SPEI" Lab Results  Component Value Date   TIBC 242 (L) 07/28/2019   FERRITIN 166 07/28/2019   IRONPCTSAT 41 (H) 07/28/2019   Lab Results  Component Value Date   LDH 147 11/13/2019   LDH 188 08/02/2019     STUDIES:   No results found.

## 2022-09-23 NOTE — Progress Notes (Signed)
Patient tolerated injection with no complaints voiced. Site clean and dry with no bruising or swelling noted at site. See MAR for details. Band aid applied.  Patient stable during and after injection. VSS with discharge and left in satisfactory condition with no s/s of distress noted.  

## 2022-09-25 LAB — CHROMOGRANIN A: Chromogranin A (ng/mL): 208.9 ng/mL — ABNORMAL HIGH (ref 0.0–101.8)

## 2022-10-21 IMAGING — CT CT ABD-PELV W/ CM
3 of 7 series · 12 of 46 positions shown, 17 images · IV contrast (Omnipaque or Isovue)
Comparison: 07/25/2019.

CLINICAL DATA: Well differentiated neuroendocrine tumor. Mesenteric
mass. Cirrhosis. Restaging.

EXAM:
CT ABDOMEN AND PELVIS WITH CONTRAST
TECHNIQUE: Multidetector CT imaging of the abdomen and pelvis was performed
using the standard protocol following bolus administration of
intravenous contrast.
CONTRAST:  125mL OMNIPAQUE IOHEXOL 300 MG/ML  SOLN

[Series 5: coronal st · coronal · 1.05mm/px · 3 of 122 slices shown]
[im 41/122  soft-tissue]
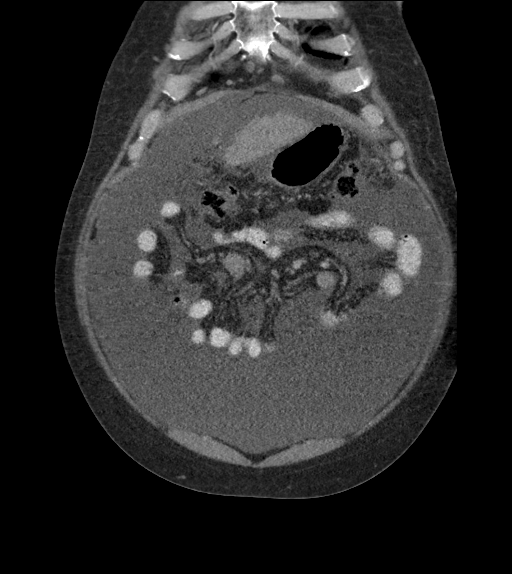
[im 54/122  soft-tissue]
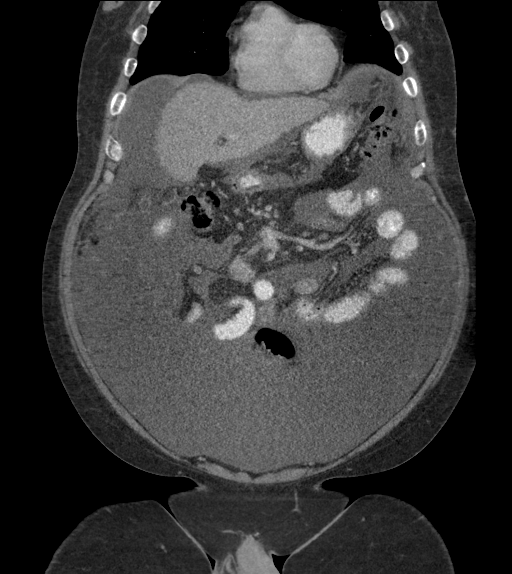
[im 68/122  soft-tissue]
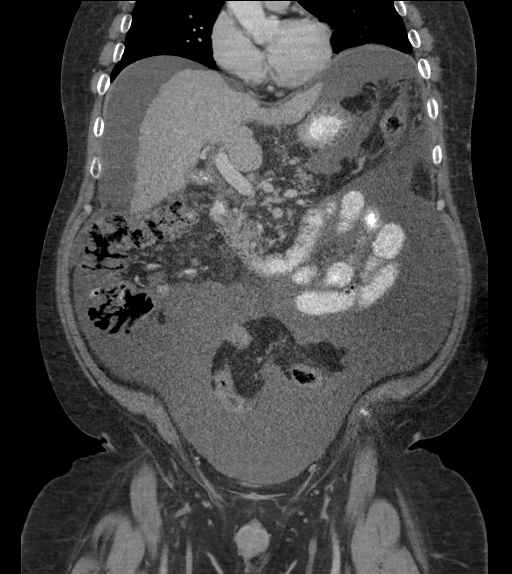

[Series 6: sagittal st · sagittal · 1.05mm/px · 1 of 151 slices shown]
[im 51/151  soft-tissue]
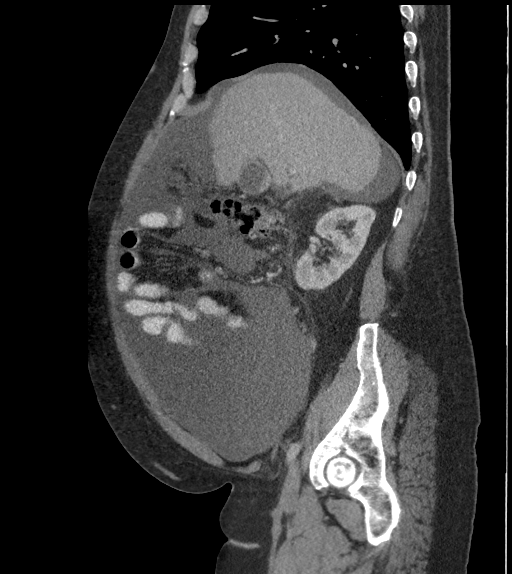

[Series 7: axial st · axial · 1.27mm/px · z∈[+928,+1393]mm · 8 of 121 slices shown, 13 images]
[im 14/121  soft-tissue]
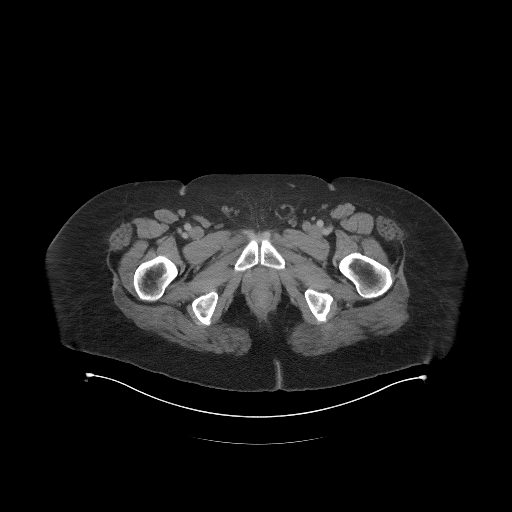
[im 14/121  bone]
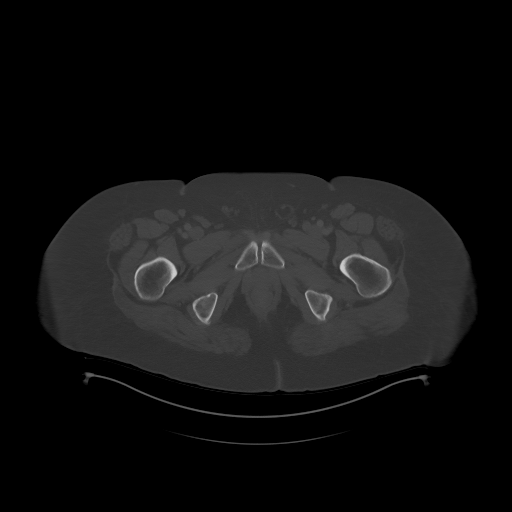
[im 27/121  soft-tissue]
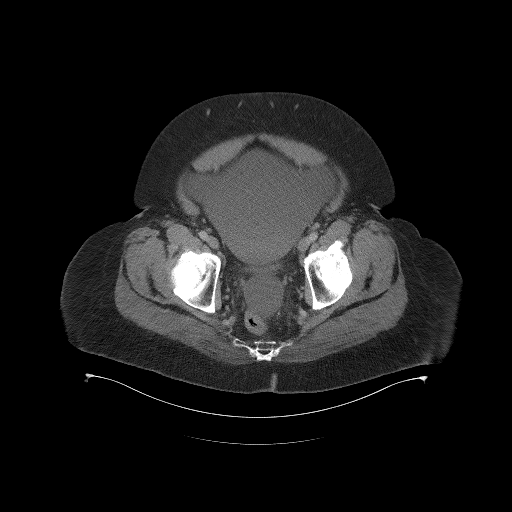
[im 41/121  soft-tissue]
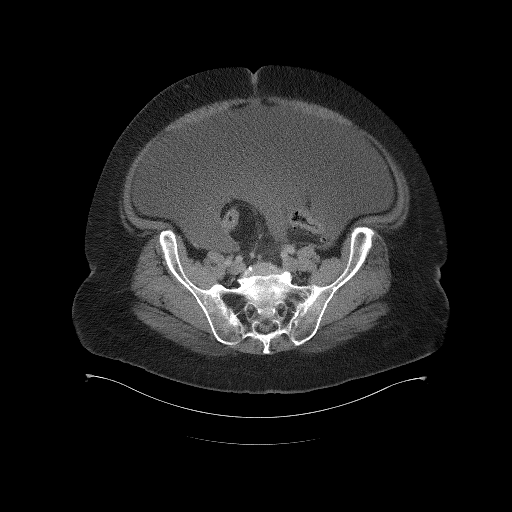
[im 54/121  soft-tissue]
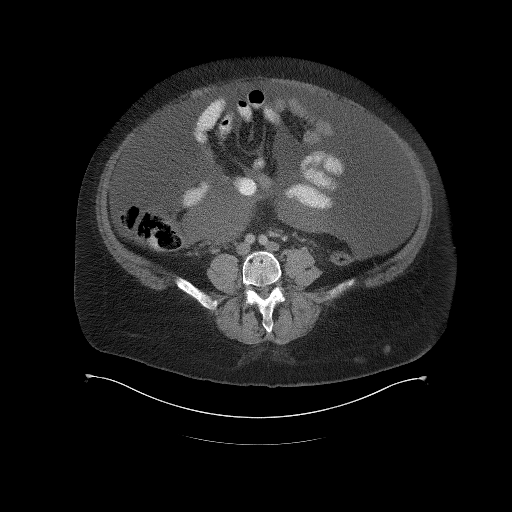
[im 67/121  soft-tissue]
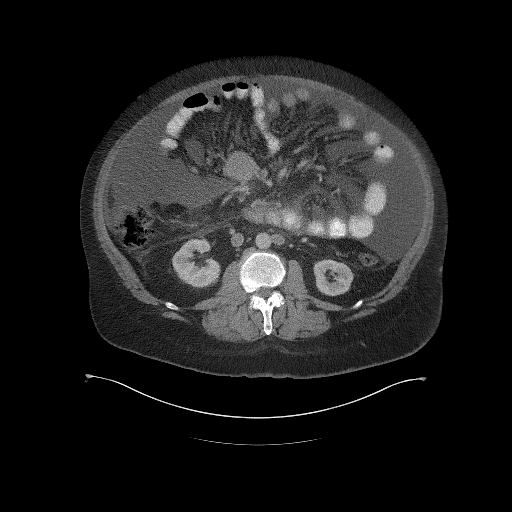
[im 67/121  lung]
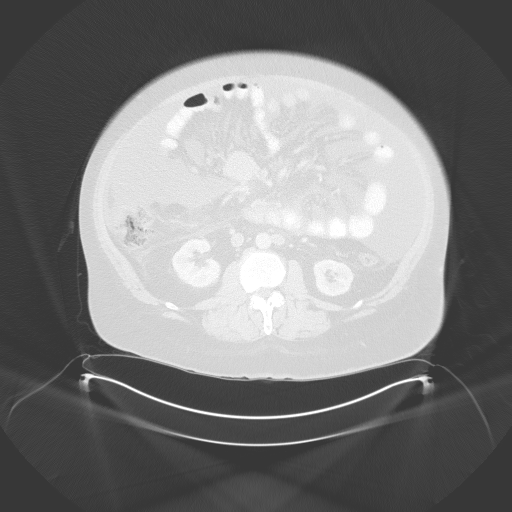
[im 81/121  soft-tissue]
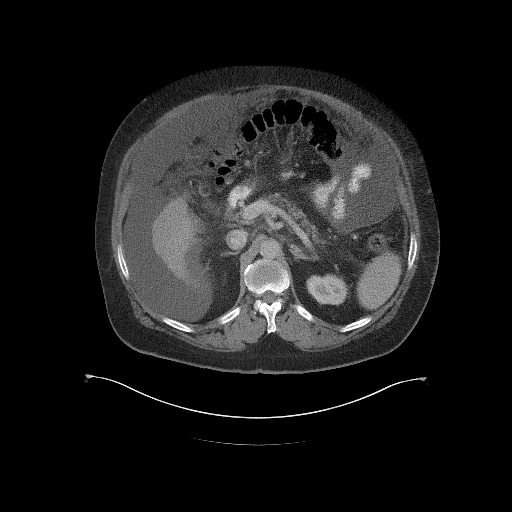
[im 81/121  lung]
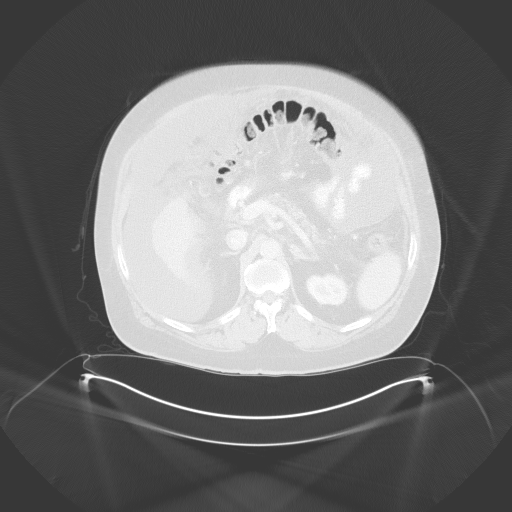
[im 94/121  soft-tissue]
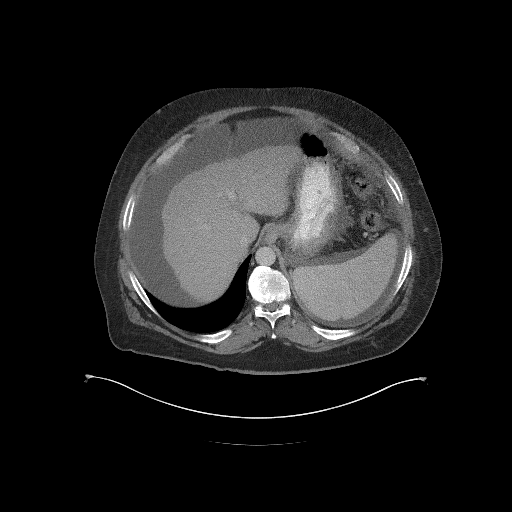
[im 94/121  lung]
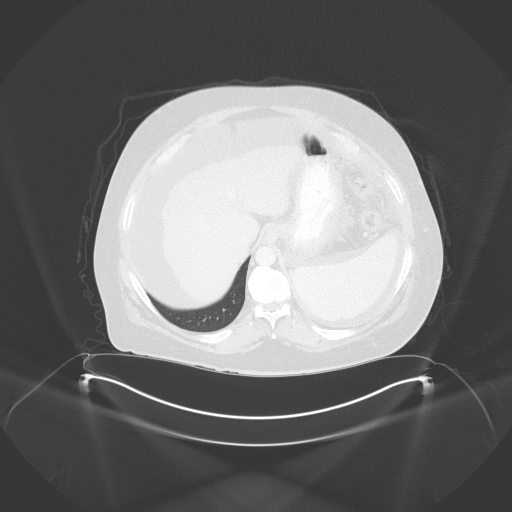
[im 107/121  soft-tissue]
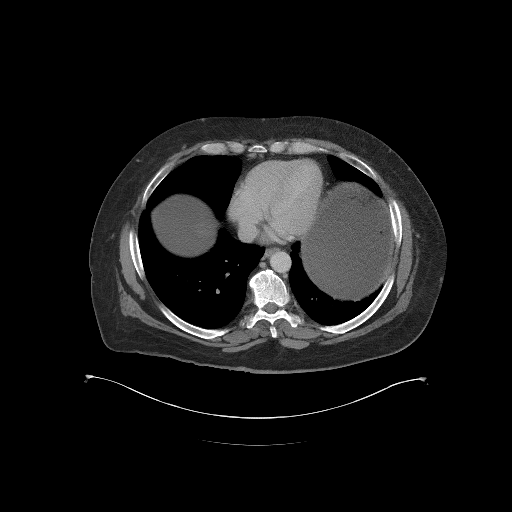
[im 107/121  lung]
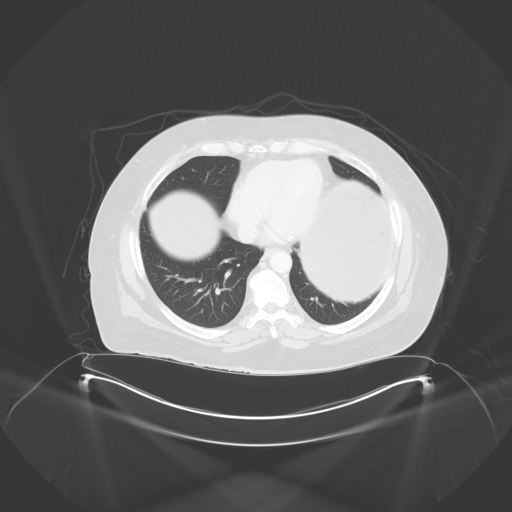

[12 of 46 positions shown; findings below may reference images not displayed]

FINDINGS: Lower chest: Unremarkable.

Hepatobiliary: Stable appearance nodular liver contour suggesting
cirrhosis. No focal parenchymal abnormality noted within the liver.
Tiny layering calcified gallstones evident. No intrahepatic or
extrahepatic biliary dilation.

Pancreas: No focal mass lesion. No dilatation of the main duct. No
intraparenchymal cyst. No peripancreatic edema.

Spleen: No splenomegaly. No focal mass lesion.

Adrenals/Urinary Tract: No adrenal nodule or mass. Right kidney
unremarkable. Small right renal stone seen on the previous study is
no longer evident. 6 mm nonobstructing stone again noted lower pole
left kidney. No ureteral or bladder stones. No secondary changes in
either kidney or ureter.

Stomach/Bowel: Stomach is unremarkable. No gastric wall thickening.
No evidence of outlet obstruction. Duodenum is normally positioned
as is the ligament of Treitz. No small bowel wall thickening. No
small bowel dilatation. The terminal ileum is normal. The appendix
is normal. No gross colonic mass. No colonic wall thickening.

Vascular/Lymphatic: No abdominal aortic aneurysm. Duplicated IVC
again noted. There is no gastrohepatic or hepatoduodenal ligament
lymphadenopathy. No retroperitoneal or mesenteric lymphadenopathy.
5.3 cm right paramidline mesenteric lesion measured previously is
stable today measuring 5.2 x 4.3 cm. Tiny nodules in the gastrocolic
ligament and root of the small bowel mesentery are stable in the
interval. No pelvic sidewall lymphadenopathy.

Reproductive: The prostate gland and seminal vesicles are
unremarkable.

Other: Large volume intraperitoneal free fluid is progressive in the
interval.55

Musculoskeletal: No worrisome lytic or sclerotic osseous
abnormality. Bilateral gynecomastia noted.
IMPRESSION: 1. Interval progression of large volume intraperitoneal free fluid.
2. Stable appearance of the right paramidline mesenteric lesion.
Numerous small soft tissue nodules in the gastrocolic ligament and
small bowel mesentery are stable.
3. Stable appearance nodular liver contour suggesting cirrhosis.
4. Cholelithiasis.
5. Nonobstructing left renal stone. Nonobstructing right renal stone
seen previously has passed in the interval.
6. Duplicated IVC.

## 2022-10-23 ENCOUNTER — Encounter (HOSPITAL_COMMUNITY): Payer: Self-pay | Admitting: Hematology

## 2022-10-23 ENCOUNTER — Inpatient Hospital Stay: Payer: Medicaid Other | Attending: Hematology

## 2022-10-23 VITALS — BP 121/78 | HR 66 | Temp 98.1°F | Resp 18

## 2022-10-23 DIAGNOSIS — C7A098 Malignant carcinoid tumors of other sites: Secondary | ICD-10-CM | POA: Insufficient documentation

## 2022-10-23 DIAGNOSIS — C7A00 Malignant carcinoid tumor of unspecified site: Secondary | ICD-10-CM

## 2022-10-23 MED ORDER — LANREOTIDE ACETATE 120 MG/0.5ML ~~LOC~~ SOLN
120.0000 mg | Freq: Once | SUBCUTANEOUS | Status: AC
  Administered 2022-10-23: 120 mg via SUBCUTANEOUS
  Filled 2022-10-23: qty 120

## 2022-10-23 NOTE — Patient Instructions (Signed)
[facility]  Discharge Instructions: Thank you for choosing [facility] to provide your oncology and hematology care.  If you have a lab appointment with the Cancer Center - please note that after April 8th, 2024, all labs will be drawn in the cancer center.  You do not have to check in or register with the main entrance as you have in the past but will complete your check-in in the cancer center.  Wear comfortable clothing and clothing appropriate for easy access to any Portacath or PICC line.   We strive to give you quality time with your provider. You may need to reschedule your appointment if you arrive late (15 or more minutes).  Arriving late affects you and other patients whose appointments are after yours.  Also, if you miss three or more appointments without notifying the office, you may be dismissed from the clinic at the provider's discretion.      For prescription refill requests, have your pharmacy contact our office and allow 72 hours for refills to be completed.  To help prevent nausea and vomiting after your treatment, we encourage you to take your nausea medication as directed.  BELOW ARE SYMPTOMS THAT SHOULD BE REPORTED IMMEDIATELY: *FEVER GREATER THAN 100.4 F (38 C) OR HIGHER *CHILLS OR SWEATING *NAUSEA AND VOMITING THAT IS NOT CONTROLLED WITH YOUR NAUSEA MEDICATION *UNUSUAL SHORTNESS OF BREATH *UNUSUAL BRUISING OR BLEEDING *URINARY PROBLEMS (pain or burning when urinating, or frequent urination) *BOWEL PROBLEMS (unusual diarrhea, constipation, pain near the anus) TENDERNESS IN MOUTH AND THROAT WITH OR WITHOUT PRESENCE OF ULCERS (sore throat, sores in mouth, or a toothache) UNUSUAL RASH, SWELLING OR PAIN  UNUSUAL VAGINAL DISCHARGE OR ITCHING   Items with * indicate a potential emergency and should be followed up as soon as possible or go to the Emergency Department if any problems should occur.  Please show the CHEMOTHERAPY ALERT CARD or  IMMUNOTHERAPY ALERT CARD at check-in to the Emergency Department and triage nurse.  Should you have questions after your visit or need to cancel or reschedule your appointment, please contact [facility] [phone]  and follow the prompts.  Office hours are 8:00 a.m. to 4:30 p.m. Monday - Friday. Please note that voicemails left after 4:00 p.m. may not be returned until the following business day.  We are closed weekends and major holidays. You have access to a nurse at all times for urgent questions. Please call the main number to the clinic [phone] and follow the prompts.  For any non-urgent questions, you may also contact your provider using MyChart. We now offer e-Visits for anyone 18 and older to request care online for non-urgent symptoms. For details visit [email or web address].   Also download the MyChart app! Go to the app store, search "MyChart", open the app, select [facility], and log in with your MyChart username and password.   

## 2022-10-23 NOTE — Progress Notes (Signed)
Patient tolerated injection with no complaints voiced.  Site clean and dry with no bruising or swelling noted at site.  See MAR for details.  Band aid applied.  Patient stable during and after injection.  Vss with discharge and left in satisfactory condition with no s/s of distress noted.  

## 2022-11-23 ENCOUNTER — Inpatient Hospital Stay: Payer: Medicaid Other | Attending: Hematology

## 2022-11-23 VITALS — BP 107/66 | HR 66 | Temp 97.8°F | Resp 18

## 2022-11-23 DIAGNOSIS — C7A098 Malignant carcinoid tumors of other sites: Secondary | ICD-10-CM | POA: Insufficient documentation

## 2022-11-23 DIAGNOSIS — C7A00 Malignant carcinoid tumor of unspecified site: Secondary | ICD-10-CM

## 2022-11-23 MED ORDER — LANREOTIDE ACETATE 120 MG/0.5ML ~~LOC~~ SOLN
120.0000 mg | Freq: Once | SUBCUTANEOUS | Status: AC
  Administered 2022-11-23: 120 mg via SUBCUTANEOUS
  Filled 2022-11-23: qty 120

## 2022-11-23 NOTE — Patient Instructions (Signed)

## 2022-11-23 NOTE — Progress Notes (Signed)
Patient tolerated Lanreotide injection with no complaints voiced.  Patient stable during and after injection.  See MAR for details.  Site clean and dry with no bruising or swelling noted at site.  Band aid applied.  Vss with discharge and left in satisfactory condition with no s/s of distress noted.   

## 2022-12-17 ENCOUNTER — Encounter (HOSPITAL_COMMUNITY): Payer: Self-pay | Admitting: Hematology

## 2022-12-24 ENCOUNTER — Inpatient Hospital Stay: Payer: Medicare Other | Admitting: Hematology

## 2022-12-24 ENCOUNTER — Inpatient Hospital Stay: Payer: Medicare Other

## 2022-12-24 ENCOUNTER — Encounter (HOSPITAL_COMMUNITY): Payer: Self-pay | Admitting: Hematology

## 2022-12-24 ENCOUNTER — Inpatient Hospital Stay: Payer: Medicare Other | Attending: Hematology

## 2022-12-24 VITALS — BP 108/82 | HR 70 | Temp 97.9°F | Resp 16 | Wt 298.9 lb

## 2022-12-24 DIAGNOSIS — C7A00 Malignant carcinoid tumor of unspecified site: Secondary | ICD-10-CM

## 2022-12-24 DIAGNOSIS — Z79899 Other long term (current) drug therapy: Secondary | ICD-10-CM | POA: Insufficient documentation

## 2022-12-24 DIAGNOSIS — D696 Thrombocytopenia, unspecified: Secondary | ICD-10-CM | POA: Diagnosis not present

## 2022-12-24 DIAGNOSIS — C7A098 Malignant carcinoid tumors of other sites: Secondary | ICD-10-CM | POA: Diagnosis present

## 2022-12-24 LAB — MAGNESIUM: Magnesium: 1.8 mg/dL (ref 1.7–2.4)

## 2022-12-24 LAB — CBC WITH DIFFERENTIAL/PLATELET
Abs Immature Granulocytes: 0.01 10*3/uL (ref 0.00–0.07)
Basophils Absolute: 0 10*3/uL (ref 0.0–0.1)
Basophils Relative: 1 %
Eosinophils Absolute: 0.4 10*3/uL (ref 0.0–0.5)
Eosinophils Relative: 8 %
HCT: 41.2 % (ref 39.0–52.0)
Hemoglobin: 13.8 g/dL (ref 13.0–17.0)
Immature Granulocytes: 0 %
Lymphocytes Relative: 29 %
Lymphs Abs: 1.5 10*3/uL (ref 0.7–4.0)
MCH: 32.5 pg (ref 26.0–34.0)
MCHC: 33.5 g/dL (ref 30.0–36.0)
MCV: 96.9 fL (ref 80.0–100.0)
Monocytes Absolute: 0.5 10*3/uL (ref 0.1–1.0)
Monocytes Relative: 11 %
Neutro Abs: 2.7 10*3/uL (ref 1.7–7.7)
Neutrophils Relative %: 51 %
Platelets: 127 10*3/uL — ABNORMAL LOW (ref 150–400)
RBC: 4.25 MIL/uL (ref 4.22–5.81)
RDW: 13.8 % (ref 11.5–15.5)
WBC: 5.2 10*3/uL (ref 4.0–10.5)
nRBC: 0 % (ref 0.0–0.2)

## 2022-12-24 LAB — COMPREHENSIVE METABOLIC PANEL
ALT: 18 U/L (ref 0–44)
AST: 24 U/L (ref 15–41)
Albumin: 2.9 g/dL — ABNORMAL LOW (ref 3.5–5.0)
Alkaline Phosphatase: 104 U/L (ref 38–126)
Anion gap: 8 (ref 5–15)
BUN: 16 mg/dL (ref 6–20)
CO2: 28 mmol/L (ref 22–32)
Calcium: 8.5 mg/dL — ABNORMAL LOW (ref 8.9–10.3)
Chloride: 99 mmol/L (ref 98–111)
Creatinine, Ser: 1.13 mg/dL (ref 0.61–1.24)
GFR, Estimated: >60 mL/min (ref >60)
Glucose, Bld: 112 mg/dL — ABNORMAL HIGH (ref 70–99)
Potassium: 3.7 mmol/L (ref 3.5–5.1)
Sodium: 135 mmol/L (ref 135–145)
Total Bilirubin: 1.3 mg/dL — ABNORMAL HIGH (ref 0.3–1.2)
Total Protein: 8 g/dL (ref 6.5–8.1)

## 2022-12-24 MED ORDER — LANREOTIDE ACETATE 120 MG/0.5ML ~~LOC~~ SOLN
120.0000 mg | Freq: Once | SUBCUTANEOUS | Status: AC
  Administered 2022-12-24: 120 mg via SUBCUTANEOUS
  Filled 2022-12-24: qty 120

## 2022-12-24 NOTE — Patient Instructions (Signed)
[facility] at [facility] Discharge Instructions   You were seen and examined today by Dr. Ellin Saba.  He reviewed the results of your CT scan which is stable. The cancer has not grown or spread.   He reviewed the results of your lab work which are normal/stable.   We will proceed with your lanreotide injection today and every 4 weeks.   We will see you back in . We will repeat lab work prior to this visit.    Thank you for choosing [facility] at [facility] to provide your oncology and hematology care.  To afford each patient quality time with our provider, please arrive at least 15 minutes before your scheduled appointment time.   If you have a lab appointment with the Cancer Center please come in thru the Main Entrance and check in at the main information desk.  You need to re-schedule your appointment should you arrive 10 or more minutes late.  We strive to give you quality time with our providers, and arriving late affects you and other patients whose appointments are after yours.  Also, if you no show three or more times for appointments you may be dismissed from the clinic at the providers discretion.     Again, thank you for choosing [facility].  Our hope is that these requests will decrease the amount of time that you wait before being seen by our physicians.       _____________________________________________________________  Should you have questions after your visit to [facility], please contact our office at [phone] and follow the prompts.  Our office hours are 8:00 a.m. and 4:30 p.m. Monday - Friday.  Please note that voicemails left after 4:00 p.m. may not be returned until the following business day.  We are closed weekends and major holidays.  You do have access to a nurse 24-7, just call the main number to the clinic [phone] and do not press any options, hold on the line and a nurse will  answer the phone.    For prescription refill requests, have your pharmacy contact our office and allow 72 hours.    Due to Covid, you will need to wear a mask upon entering the hospital. If you do not have a mask, a mask will be given to you at the Main Entrance upon arrival. For doctor visits, patients may have 1 support person age 74 or older with them. For treatment visits, patients can not have anyone with them due to social distancing guidelines and our immunocompromised population.

## 2022-12-24 NOTE — Progress Notes (Signed)
Lanreotide acetate injection given per orders. Patient tolerated it well without problems. Vitals stable and discharged home from clinic ambulatory. Follow up as scheduled.

## 2022-12-24 NOTE — Progress Notes (Signed)
[facility] [address] [place] [address]    Clinic Day:  12/24/2022  Referring physician: Evie Lacks, MD  Patient Care Team: Evie Lacks, MD as PCP - General Rourk, Gerrit Friends, MD as Consulting Physician (Gastroenterology)   ASSESSMENT & PLAN:   Assessment: 1.   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  Possible mild nodular infiltration in the left upper quadrant mesentery and anterior omentum.  Multiple small mesenteric root nodes. -PET scan on 09/05/2019 showed 4.2 cm right anterior mesenteric mass with punctate calcifications, SUV 7.2.  Moderate ascites present.  No definite retroperitoneal adenopathy.  Liver is cirrhotic with spleen enlarged.  Diffusely elevated hepatic activity which is nonspecific. -CT-guided biopsy of the mesenteric mass on 10/09/2019 at [facility] consistent with well-differentiated neuroendocrine tumor (carcinoid tumor).  Tumor cells are strongly positive for chromogranin and synaptophysin.  Negative for CK7, CK20, TTF-1, PSA, NKX3.1, S100, HMB-45, and CD56. -Gallium dotatate PET CT scan on 11/08/2019 at [facility] shows large solid mesenteric midline abdomen mass with increased activity measuring 6.5 cm, SUV of 70.  In the uncinate process there is an area of increased activity visualized since seen on series 603 image 91 measuring 13.7 SUV.  Increased activity throughout the liver and spleen limiting evaluation for metastatic disease.  Hepatic activity is heterogeneous. -Lanreotide started on 11/13/2019. - CT CAP (07/11/2021): 4.6 cm soft tissue mass along the mesenteric root.  No significant abdominal adenopathy.  Small volume ascites.  Cirrhosis. - CT CAP (04/14/2022): Stable 4.6 cm soft tissue mass along the mesenteric root.  Cirrhosis.   Moderate volume ascites.  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   2.  Cirrhosis and ascites: -Thought to be secondary to hepatitis C. -Last paracentesis on 08/21/2019 with 4.8 L removed.    Plan: 1.  Well differentiated neuroendocrine tumor of the mesenteric mass: - Denies any flushing/wheezing/diarrhea. - He is tolerating lanreotide very well. - Labs from 12/24/2022: Normal LFTs with mildly elevated total bilirubin 1.3.  CBC grossly normal with mild thrombocytopenia from splenomegaly.  Last chromogranin was 208.9.  Level from today is pending. - Reviewed CTAP from 10/05/2022 from [place].  Stable 4.9 cm soft tissue mass along the mesenteric root.  Scattered areas of soft tissue stranding throughout the omentum stable.  Cirrhosis.  Moderate left pleural effusion is stable. - No evidence of progression at this time. - Recommend continuing lanreotide monthly. - RTC 3 months for follow-up.  Will plan on repeating CTAP prior to next visit.   2.  Cirrhosis and ascites: - He has completed hepatitis C treatment.    3.  Fluid retention: - Continue 40 mg furosemide daily.  Continue spironolactone 100 mg as needed.  Weight is stable.    Orders Placed This Encounter  Procedures   CT ABDOMEN PELVIS W CONTRAST    Standing Status:   Future    Standing Expiration Date:   12/24/2023    Order Specific Question:   If indicated for the ordered procedure, I authorize the administration of contrast media per Radiology protocol    Answer:   Yes    Order Specific Question:   Does the patient have a contrast media/X-ray dye allergy?    Answer:   No    Order Specific Question:  Preferred imaging location?    Answer:   [facility]    Order Specific Question:   If indicated for the ordered procedure, I authorize the administration of oral contrast media per Radiology protocol    Answer:   Yes   CBC with Differential     Standing Status:   Future    Standing Expiration Date:   12/24/2023   Comprehensive metabolic panel    Standing Status:   Future    Standing Expiration Date:   12/24/2023   Chromogranin A    Standing Status:   Future    Standing Expiration Date:   12/24/2023      Alben Deeds Teague,acting as a scribe for Doreatha Massed, MD.,have documented all relevant documentation on the behalf of Doreatha Massed, MD,as directed by  Doreatha Massed, MD while in the presence of Doreatha Massed, MD.  I, Doreatha Massed MD, have reviewed the above documentation for accuracy and completeness, and I agree with the above.    Doreatha Massed, MD   9/12/20245:27 PM  CHIEF COMPLAINT:   Diagnosis: neuroendocrine tumor    Cancer Staging  No matching staging information was found for the patient.    Prior Therapy: none  Current Therapy:  Lanreotide monthly    HISTORY OF PRESENT ILLNESS:   Oncology History   No history exists.     INTERVAL HISTORY:   Nicolas [name] is a 57 y.o. male presenting to clinic today for follow up of neuroendocrine tumor. He was last seen by me on 09/23/22.  Since his last visit, he underwent CT A/P on 10/05/22 that was stable.   Today, he states that he is doing well overall. His appetite level is at 100%. His energy level is at 25%.  He reports a normal appetite. He denies any changes in his symptoms or issues with his shots. He denies any difficulty breathing. He is taking Lasix as prescribed and spironolactone prn. He notes that when he takes spironolactone often, he has mid to low back pain. He denies any flushing.   PAST MEDICAL HISTORY:   Past Medical History: Past Medical History:  Diagnosis Date   Cirrhosis (HCC)    Hepatitis C    Ab + 07/28/19. RNA 436,000   HTN (hypertension)     Surgical History: Past Surgical History:  Procedure Laterality Date   debridement  Right    per pt, debridement of rt hand    Social History: Social  History   Socioeconomic History   Marital status: Divorced    Spouse name: Not on file   Number of children: 4   Years of education: Not on file   Highest education level: Not on file  Occupational History   Occupation: unemployed  Tobacco Use   Smoking status: Never   Smokeless tobacco: Current    Types: Snuff   Tobacco comments:    started using snuff since 1986  Vaping Use   Vaping status: Never Used  Substance and Sexual Activity   Alcohol use: Not Currently    Comment: occasional   Drug use: Never   Sexual activity: Not Currently  Other Topics Concern   Not on file  Social History Narrative   Not on file   Social Determinants of Health   Financial Resource Strain: Low Risk  (08/02/2019)   Overall Financial Resource Strain (CARDIA)    Difficulty of Paying Living Expenses: Not very hard  Food Insecurity: No Food Insecurity (08/02/2019)   Hunger Vital Sign  Worried About Programme researcher, broadcasting/film/video in the Last Year: Never true    Ran Out of Food in the Last Year: Never true  Transportation Needs: Unmet Transportation Needs (08/02/2019)   PRAPARE - Administrator, Civil Service (Medical): Yes    Lack of Transportation (Non-Medical): Yes  Physical Activity: Inactive (08/02/2019)   Exercise Vital Sign    Days of Exercise per Week: 0 days    Minutes of Exercise per Session: 0 min  Stress: Stress Concern Present (08/02/2019)   [employer] of Occupational Health - Occupational Stress Questionnaire    Feeling of Stress : To some extent  Social Connections: Socially Isolated (08/02/2019)   Social Connection and Isolation Panel [NHANES]    Frequency of Communication with Friends and Family: Three times a week    Frequency of Social Gatherings with Friends and Family: Three times a week    Attends Religious Services: Never    Active Member of Clubs or Organizations: No    Attends Banker Meetings: Never    Marital Status: Divorced  Catering manager  Violence: Not At Risk (08/02/2019)   Humiliation, Afraid, Rape, and Kick questionnaire    Fear of Current or Ex-Partner: No    Emotionally Abused: No    Physically Abused: No    Sexually Abused: No    Family History: Family History  Problem Relation Age of Onset   Cirrhosis Sister        per patient, due to drug use.    Alzheimer's disease Mother    Cancer Mother    Diabetes Mother    Congestive Heart Failure Father    Diabetes Father    Alzheimer's disease Paternal Grandmother    Osteoporosis Paternal Grandmother    Diabetes Daughter    Colon cancer Neg Hx     Current Medications:  Current Outpatient Medications:    clotrimazole (LOTRIMIN) 1 % cream, Apply 1 application topically 2 (two) times daily., Disp: 30 g, Rfl: 3   diphenoxylate-atropine (LOMOTIL) 2.5-0.025 MG tablet, Take 2 tablets after first watery stool, then 1 tablet after each watery stool.  No more than 8 tablets daily., Disp: 60 tablet, Rfl: 3   Ensure (ENSURE), Take 237 mLs by mouth every evening., Disp: , Rfl:    furosemide (LASIX) 40 MG tablet, Take 1 tablet (40 mg total) by mouth daily., Disp: 30 tablet, Rfl: 5   Hepatitis A-Hep B Recomb Vac (TWINRIX) 720-20 ELU-MCG/ML injection, Inject into the muscle. Pt has last shot in July, Disp: , Rfl:    pantoprazole (PROTONIX) 40 MG tablet, Take 1 tablet (40 mg total) by mouth daily., Disp: 30 tablet, Rfl: 3   propranolol (INDERAL) 20 MG tablet, Take 1 tablet by mouth daily., Disp: , Rfl:    sertraline (ZOLOFT) 50 MG tablet, Take 1 tablet (50 mg total) by mouth daily., Disp: 60 tablet, Rfl: 0   sildenafil (VIAGRA) 50 MG tablet, Take by mouth., Disp: , Rfl:    spironolactone (ALDACTONE) 100 MG tablet, Take one tablet in AM, Disp: 60 tablet, Rfl: 2 No current facility-administered medications for this visit.  Facility-Administered Medications Ordered in Other Visits:    epoetin alfa-epbx (RETACRIT) 16109 UNIT/ML injection, , , ,    epoetin alfa-epbx (RETACRIT) 60454  UNIT/ML injection, , , ,    lanreotide acetate (SOMATULINE DEPOT) 120 MG/0.5ML injection, , , ,    Allergies: No Known Allergies  REVIEW OF SYSTEMS:   Review of Systems  Constitutional:  Negative for  chills, fatigue and fever.  HENT:   Negative for lump/mass, mouth sores, nosebleeds, sore throat and trouble swallowing.   Eyes:  Negative for eye problems.  Respiratory:  Positive for cough. Negative for shortness of breath.   Cardiovascular:  Negative for chest pain, leg swelling and palpitations.  Gastrointestinal:  Positive for constipation and diarrhea. Negative for abdominal pain, nausea and vomiting.  Genitourinary:  Negative for bladder incontinence, difficulty urinating, dysuria, frequency, hematuria and nocturia.   Musculoskeletal:  Negative for arthralgias, back pain, flank pain, myalgias and neck pain.       +right flank pain, 10/10 severity  Skin:  Negative for itching and rash.  Neurological:  Negative for dizziness, headaches and numbness.  Hematological:  Does not bruise/bleed easily.  Psychiatric/Behavioral:  Negative for depression, sleep disturbance and suicidal ideas. The patient is not nervous/anxious.   All other systems reviewed and are negative.    VITALS:   Blood pressure 108/82, pulse 70, temperature 97.9 F (36.6 C), temperature source Oral, resp. rate 16, weight 298 lb 14.4 oz (135.6 kg), SpO2 99%.  Wt Readings from Last 3 Encounters:  12/24/22 298 lb 14.4 oz (135.6 kg)  09/23/22 297 lb 6.4 oz (134.9 kg)  08/26/22 (!) 303 lb 6.4 oz (137.6 kg)    Body mass index is 37.56 kg/m.  Performance status (ECOG): 1 - Symptomatic but completely ambulatory  PHYSICAL EXAM:   Physical Exam Vitals and nursing note reviewed. Exam conducted with a chaperone present.  Constitutional:      Appearance: Normal appearance.  Cardiovascular:     Rate and Rhythm: Normal rate and regular rhythm.     Pulses: Normal pulses.     Heart sounds: Normal heart sounds.   Pulmonary:     Effort: Pulmonary effort is normal.     Breath sounds: Examination of the left-lower field reveals decreased breath sounds. Decreased breath sounds present.  Abdominal:     Palpations: Abdomen is soft. There is no hepatomegaly, splenomegaly or mass.     Tenderness: There is no abdominal tenderness.  Musculoskeletal:     Right lower leg: No edema.     Left lower leg: No edema.  Lymphadenopathy:     Cervical: No cervical adenopathy.     Right cervical: No superficial, deep or posterior cervical adenopathy.    Left cervical: No superficial, deep or posterior cervical adenopathy.     Upper Body:     Right upper body: No supraclavicular or axillary adenopathy.     Left upper body: No supraclavicular or axillary adenopathy.  Neurological:     General: No focal deficit present.     Mental Status: He is alert and oriented to person, place, and time.  Psychiatric:        Mood and Affect: Mood normal.        Behavior: Behavior normal.     LABS:      Latest Ref Rng & Units 12/24/2022   10:10 AM 09/23/2022   12:34 PM 07/01/2022   12:28 PM  CBC  WBC 4.0 - 10.5 K/uL 5.2  5.0  5.0   Hemoglobin 13.0 - 17.0 g/dL 40.9  81.1  91.4   Hematocrit 39.0 - 52.0 % 41.2  42.8  42.9   Platelets 150 - 400 K/uL 127  127  155       Latest Ref Rng & Units 12/24/2022   10:10 AM 09/23/2022   12:34 PM 07/01/2022   12:28 PM  CMP  Glucose 70 - 99  mg/dL 657  846  962   BUN 6 - 20 mg/dL 16  17  17    Creatinine 0.61 - 1.24 mg/dL 9.52  8.41  3.24   Sodium 135 - 145 mmol/L 135  137  133   Potassium 3.5 - 5.1 mmol/L 3.7  4.5  4.0   Chloride 98 - 111 mmol/L 99  103  99   CO2 22 - 32 mmol/L 28  27  26    Calcium 8.9 - 10.3 mg/dL 8.5  8.9  8.4   Total Protein 6.5 - 8.1 g/dL 8.0  8.3  8.1   Total Bilirubin 0.3 - 1.2 mg/dL 1.3  1.3  1.3   Alkaline Phos 38 - 126 U/L 104  107  117   AST 15 - 41 U/L 24  27  36   ALT 0 - 44 U/L 18  21  22       Lab Results  Component Value Date   CEA1 1.6  11/13/2019   /  CEA  Date Value Ref Range Status  11/13/2019 1.6 0.0 - 4.7 ng/mL Final    Comment:    (NOTE)                             Nonsmokers          <3.9                             Smokers             <5.6 Roche [employer] Electrochemiluminescence Immunoassay (ECLIA) Values obtained with different assay methods or kits cannot be used interchangeably.  Results cannot be interpreted as absolute evidence of the presence or absence of malignant disease. Performed At: [facility] [address] [place], [place] [id number] Jolene Schimke MD [id number]    No results found for: "PSA1" No results found for: "CAN199" No results found for: "CAN125"  No results found for: "TOTALPROTELP", "ALBUMINELP", "A1GS", "A2GS", "BETS", "BETA2SER", "GAMS", "MSPIKE", "SPEI" Lab Results  Component Value Date   TIBC 242 (L) 07/28/2019   FERRITIN 166 07/28/2019   IRONPCTSAT 41 (H) 07/28/2019   Lab Results  Component Value Date   LDH 147 11/13/2019   LDH 188 08/02/2019     STUDIES:   No results found.

## 2022-12-24 NOTE — Progress Notes (Signed)
Order faxed to [facility] [phone] for CT AP.  Phone [phone].

## 2022-12-26 LAB — CHROMOGRANIN A: Chromogranin A (ng/mL): 205.5 ng/mL — ABNORMAL HIGH (ref 0.0–101.8)

## 2023-01-01 ENCOUNTER — Encounter (HOSPITAL_COMMUNITY): Payer: Self-pay | Admitting: Hematology

## 2023-01-21 ENCOUNTER — Inpatient Hospital Stay: Payer: Medicare Other | Attending: Hematology

## 2023-01-21 VITALS — BP 130/76 | HR 95 | Temp 98.3°F | Resp 20 | Wt 300.6 lb

## 2023-01-21 DIAGNOSIS — C7A098 Malignant carcinoid tumors of other sites: Secondary | ICD-10-CM | POA: Diagnosis present

## 2023-01-21 DIAGNOSIS — C7A00 Malignant carcinoid tumor of unspecified site: Secondary | ICD-10-CM

## 2023-01-21 MED ORDER — LANREOTIDE ACETATE 120 MG/0.5ML ~~LOC~~ SOLN
120.0000 mg | Freq: Once | SUBCUTANEOUS | Status: AC
  Administered 2023-01-21: 120 mg via SUBCUTANEOUS
  Filled 2023-01-21: qty 120

## 2023-01-21 NOTE — Progress Notes (Signed)
Lanreotide injection  given per orders. Patient tolerated it well without problems. Vitals stable and discharged home from clinic ambulatory. Follow up as scheduled.

## 2023-02-12 ENCOUNTER — Encounter (HOSPITAL_COMMUNITY): Payer: Self-pay | Admitting: Hematology

## 2023-02-18 ENCOUNTER — Inpatient Hospital Stay: Payer: Medicare Other | Attending: Hematology

## 2023-02-18 VITALS — BP 117/79 | HR 83 | Temp 98.9°F | Resp 18

## 2023-02-18 DIAGNOSIS — C7A098 Malignant carcinoid tumors of other sites: Secondary | ICD-10-CM | POA: Diagnosis present

## 2023-02-18 DIAGNOSIS — C7A00 Malignant carcinoid tumor of unspecified site: Secondary | ICD-10-CM

## 2023-02-18 MED ORDER — LANREOTIDE ACETATE 120 MG/0.5ML ~~LOC~~ SOLN
120.0000 mg | Freq: Once | SUBCUTANEOUS | Status: AC
  Administered 2023-02-18: 120 mg via SUBCUTANEOUS
  Filled 2023-02-18: qty 120

## 2023-02-18 NOTE — Patient Instructions (Signed)
[facility] - A DEPT OF [facility]  Discharge Instructions: Thank you for choosing [facility] to provide your oncology and hematology care.  If you have a lab appointment with the Cancer Center - please note that after April 8th, 2024, all labs will be drawn in the cancer center.  You do not have to check in or register with the main entrance as you have in the past but will complete your check-in in the cancer center.  Wear comfortable clothing and clothing appropriate for easy access to any Portacath or PICC line.   We strive to give you quality time with your provider. You may need to reschedule your appointment if you arrive late (15 or more minutes).  Arriving late affects you and other patients whose appointments are after yours.  Also, if you miss three or more appointments without notifying the office, you may be dismissed from the clinic at the provider's discretion.      For prescription refill requests, have your pharmacy contact our office and allow 72 hours for refills to be completed.    Today you received Lanroetide injection     BELOW ARE SYMPTOMS THAT SHOULD BE REPORTED IMMEDIATELY: *FEVER GREATER THAN 100.4 F (38 C) OR HIGHER *CHILLS OR SWEATING *NAUSEA AND VOMITING THAT IS NOT CONTROLLED WITH YOUR NAUSEA MEDICATION *UNUSUAL SHORTNESS OF BREATH *UNUSUAL BRUISING OR BLEEDING *URINARY PROBLEMS (pain or burning when urinating, or frequent urination) *BOWEL PROBLEMS (unusual diarrhea, constipation, pain near the anus) TENDERNESS IN MOUTH AND THROAT WITH OR WITHOUT PRESENCE OF ULCERS (sore throat, sores in mouth, or a toothache) UNUSUAL RASH, SWELLING OR PAIN  UNUSUAL VAGINAL DISCHARGE OR ITCHING   Items with * indicate a potential emergency and should be followed up as soon as possible or go to the Emergency Department if any problems should occur.  Please show the CHEMOTHERAPY ALERT CARD or IMMUNOTHERAPY ALERT CARD at  check-in to the Emergency Department and triage nurse.  Should you have questions after your visit or need to cancel or reschedule your appointment, please contact [facility] - A DEPT OF [facility] [phone]  and follow the prompts.  Office hours are 8:00 a.m. to 4:30 p.m. Monday - Friday. Please note that voicemails left after 4:00 p.m. may not be returned until the following business day.  We are closed weekends and major holidays. You have access to a nurse at all times for urgent questions. Please call the main number to the clinic [phone] and follow the prompts.  For any non-urgent questions, you may also contact your provider using MyChart. We now offer e-Visits for anyone 70 and older to request care online for non-urgent symptoms. For details visit [email or web address].   Also download the MyChart app! Go to the app store, search "MyChart", open the app, select [facility], and log in with your MyChart username and password.

## 2023-02-18 NOTE — Progress Notes (Signed)
Nicolas [name] presents today for injection per the provider's orders. Lanroetide 120 mg  administration without incident; injection site WNL; see MAR for injection details.  Patient tolerated procedure well and without incident.  No questions or complaints noted at this time.   Discharged from clinic ambulatory in stable condition. Alert and oriented x 3. F/U with [facility] as scheduled.

## 2023-03-16 NOTE — Progress Notes (Signed)
[facility] [address], [place] [address]    Clinic Day:  03/18/2023  Referring physician: Evie Lacks, MD  Patient Care Team: Evie Lacks, MD as PCP - General Rourk, Gerrit Friends, MD as Consulting Physician (Gastroenterology)   ASSESSMENT & PLAN:   Assessment: 1.   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  Possible mild nodular infiltration in the left upper quadrant mesentery and anterior omentum.  Multiple small mesenteric root nodes. -PET scan on 09/05/2019 showed 4.2 cm right anterior mesenteric mass with punctate calcifications, SUV 7.2.  Moderate ascites present.  No definite retroperitoneal adenopathy.  Liver [name] cirrhotic with spleen enlarged.  Diffusely elevated hepatic activity which [name] nonspecific. -CT-guided biopsy of the mesenteric mass on 10/09/2019 at [facility] consistent with well-differentiated neuroendocrine tumor (carcinoid tumor).  Tumor cells are strongly positive for chromogranin and synaptophysin.  Negative for CK7, CK20, TTF-1, PSA, NKX3.1, S100, HMB-45, and CD56. -Gallium dotatate PET CT scan on 11/08/2019 at [facility] shows large solid mesenteric midline abdomen mass with increased activity measuring 6.5 cm, SUV of 70.  In the uncinate process there [name] an area of increased activity visualized since seen on series 603 image 91 measuring 13.7 SUV.  Increased activity throughout the liver and spleen limiting evaluation for metastatic disease.  Hepatic activity [name] heterogeneous. -Lanreotide started on 11/13/2019. - CT CAP (07/11/2021): 4.6 cm soft tissue mass along the mesenteric root.  No significant abdominal adenopathy.  Small volume ascites.  Cirrhosis. - CT CAP (04/14/2022): Stable 4.6 cm soft tissue mass along the mesenteric root.  Cirrhosis.   Moderate volume ascites.  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   2.  Cirrhosis and ascites: -Thought to be secondary to hepatitis C. -Last paracentesis on 08/21/2019 with 4.8 L removed.    Plan: 1.  Well differentiated neuroendocrine tumor of the mesenteric mass: - Denies flushing/wheezing/abdominal pain. - He has diarrhea lasting about 1 to 2 weeks after each lanreotide injection.  He gradually gets constipated after that. - He [name] taking "Shilajit" herbal supplement for constipation for a month which [name] helping. - He did not have the CT scan done yet.  [place] [facility] has not called him to schedule it yet as they are awaiting the insurance authorization. - Reviewed labs: Normal LFTs with albumin 3.0.  CBC was grossly normal.  Chromogranin was 205. - Continue monthly lanreotide.  RTC 3 months for follow-up.   2.  Cirrhosis and ascites: - He has completed hepatitis C treatment as in the past.    3.  Fluid retention: - Continue furosemide 40 mg and spironolactone 100 mg daily.  Weight [name] stable.    No orders of the defined types were placed in this encounter.     Alben Deeds Teague,acting as a Neurosurgeon for Doreatha Massed, MD.,have documented all relevant documentation on the behalf of Doreatha Massed, MD,as directed by  Doreatha Massed, MD while in the presence of Doreatha Massed, MD.  I, Doreatha Massed MD, have reviewed the above documentation for accuracy and completeness, and I agree with the above.     Doreatha Massed, MD   12/5/20245:02 PM  CHIEF COMPLAINT:   Diagnosis: neuroendocrine tumor    Cancer Staging  No matching staging information was found for the patient.  Prior Therapy: none  Current Therapy:  Lanreotide monthly    HISTORY OF PRESENT ILLNESS:   Oncology History   No history exists.     INTERVAL HISTORY:   Nicolas [name] a 57 y.o. male  presenting to clinic today for follow up of neuroendocrine tumor. He was last seen by me on 12/24/22.  Today, he states that he [name] doing well overall. His appetite level [name] at 100%. His energy level [name] at 50%.  He notes pain in the right loin that [name] tender upon palpation. He reports 1-2 weeks of diarrhea after Lanreotide injection, followed by 1 week of constipation. He has been taking Shilajit to improve and prevent constipation for the past month. He [name] taking Lasix and spironolactone as prescribed. He has not been called by radiology for CT imaging.   PAST MEDICAL HISTORY:   Past Medical History: Past Medical History:  Diagnosis Date   Cirrhosis (HCC)    Hepatitis C    Ab + 07/28/19. RNA 436,000   HTN (hypertension)     Surgical History: Past Surgical History:  Procedure Laterality Date   debridement  Right    per pt, debridement of rt hand    Social History: Social History   Socioeconomic History   Marital status: Divorced    Spouse name: Not on file   Number of children: 4   Years of education: Not on file   Highest education level: Not on file  Occupational History   Occupation: unemployed  Tobacco Use   Smoking status: Never   Smokeless tobacco: Current    Types: Snuff   Tobacco comments:    started using snuff since 1986  Vaping Use   Vaping status: Never Used  Substance and Sexual Activity   Alcohol use: Not Currently    Comment: occasional   Drug use: Never   Sexual activity: Not Currently  Other Topics Concern   Not on file  Social History Narrative   Not on file   Social Determinants of Health   Financial Resource Strain: Low Risk  (08/02/2019)   Overall Financial Resource Strain (CARDIA)    Difficulty of Paying Living Expenses: Not very hard  Food Insecurity: No Food Insecurity (08/02/2019)   Hunger Vital Sign    Worried About Running Out of Food in the Last Year: Never true    Ran Out of Food in the Last Year: Never true  Transportation Needs:  Unmet Transportation Needs (08/02/2019)   PRAPARE - Transportation    Lack of Transportation (Medical): Yes    Lack of Transportation (Non-Medical): Yes  Physical Activity: Inactive (08/02/2019)   Exercise Vital Sign    Days of Exercise per Week: 0 days    Minutes of Exercise per Session: 0 min  Stress: Stress Concern Present (08/02/2019)   [employer] of Occupational Health - Occupational Stress Questionnaire    Feeling of Stress : To some extent  Social Connections: Socially Isolated (08/02/2019)   Social Connection and Isolation Panel [NHANES]    Frequency of Communication with Friends and Family: Three times a week    Frequency of Social Gatherings with Friends and Family: Three times a week    Attends Religious Services: Never    Active Member of Clubs or Organizations: No    Attends Banker Meetings: Never    Marital Status: Divorced  Catering manager Violence: Not At Risk (08/02/2019)   Humiliation, Afraid, Rape, and Kick questionnaire    Fear of Current or Ex-Partner: No  Emotionally Abused: No    Physically Abused: No    Sexually Abused: No    Family History: Family History  Problem Relation Age of Onset   Cirrhosis Sister        per patient, due to drug use.    Alzheimer's disease Mother    Cancer Mother    Diabetes Mother    Congestive Heart Failure Father    Diabetes Father    Alzheimer's disease Paternal Grandmother    Osteoporosis Paternal Grandmother    Diabetes Daughter    Colon cancer Neg Hx     Current Medications:  Current Outpatient Medications:    clotrimazole (LOTRIMIN) 1 % cream, Apply 1 application topically 2 (two) times daily., Disp: 30 g, Rfl: 3   diphenoxylate-atropine (LOMOTIL) 2.5-0.025 MG tablet, Take 2 tablets after first watery stool, then 1 tablet after each watery stool.  No more than 8 tablets daily., Disp: 60 tablet, Rfl: 3   Ensure (ENSURE), Take 237 mLs by mouth every evening., Disp: , Rfl:    furosemide (LASIX)  40 MG tablet, Take 1 tablet (40 mg total) by mouth daily., Disp: 30 tablet, Rfl: 5   Hepatitis A-Hep B Recomb Vac (TWINRIX) 720-20 ELU-MCG/ML injection, Inject into the muscle. Pt has last shot in July, Disp: , Rfl:    pantoprazole (PROTONIX) 40 MG tablet, Take 1 tablet (40 mg total) by mouth daily., Disp: 30 tablet, Rfl: 3   propranolol (INDERAL) 20 MG tablet, Take 1 tablet by mouth daily., Disp: , Rfl:    sertraline (ZOLOFT) 50 MG tablet, Take 1 tablet (50 mg total) by mouth daily., Disp: 60 tablet, Rfl: 0   sildenafil (VIAGRA) 50 MG tablet, Take by mouth., Disp: , Rfl:    spironolactone (ALDACTONE) 100 MG tablet, Take one tablet in AM, Disp: 60 tablet, Rfl: 2 No current facility-administered medications for this visit.  Facility-Administered Medications Ordered in Other Visits:    epoetin alfa-epbx (RETACRIT) 13086 UNIT/ML injection, , , ,    epoetin alfa-epbx (RETACRIT) 57846 UNIT/ML injection, , , ,    lanreotide acetate (SOMATULINE DEPOT) 120 MG/0.5ML injection, , , ,    Allergies: No Known Allergies  REVIEW OF SYSTEMS:   Review of Systems  Constitutional:  Negative for chills, fatigue and fever.  HENT:   Negative for lump/mass, mouth sores, nosebleeds, sore throat and trouble swallowing.   Eyes:  Negative for eye problems.  Respiratory:  Negative for cough and shortness of breath.   Cardiovascular:  Negative for chest pain, leg swelling and palpitations.  Gastrointestinal:  Positive for constipation and diarrhea. Negative for abdominal pain, nausea and vomiting.  Genitourinary:  Negative for bladder incontinence, difficulty urinating, dysuria, frequency, hematuria and nocturia.   Musculoskeletal:  Negative for arthralgias, back pain, flank pain, myalgias and neck pain.  Skin:  Negative for itching and rash.  Neurological:  Negative for dizziness, headaches and numbness.  Hematological:  Does not bruise/bleed easily.  Psychiatric/Behavioral:  Positive for sleep disturbance.  Negative for depression and suicidal ideas. The patient [name] not nervous/anxious.   All other systems reviewed and are negative.    VITALS:   Blood pressure 138/82, pulse 69, temperature 98.4 F (36.9 C), temperature source Oral, resp. rate 16, weight (!) 304 lb (137.9 kg), SpO2 99%.  Wt Readings from Last 3 Encounters:  03/18/23 (!) 304 lb (137.9 kg)  01/21/23 (!) 300 lb 9.6 oz (136.4 kg)  12/24/22 298 lb 14.4 oz (135.6 kg)    Body mass index [name] 38.2  kg/m.  Performance status (ECOG): 1 - Symptomatic but completely ambulatory  PHYSICAL EXAM:   Physical Exam Vitals and nursing note reviewed. Exam conducted with a chaperone present.  Constitutional:      Appearance: Normal appearance.  Cardiovascular:     Rate and Rhythm: Normal rate and regular rhythm.     Pulses: Normal pulses.     Heart sounds: Normal heart sounds.  Pulmonary:     Effort: Pulmonary effort [name] normal.     Breath sounds: Normal breath sounds.  Abdominal:     Palpations: Abdomen [name] soft. There [name] no hepatomegaly, splenomegaly or mass.     Tenderness: There [name] no abdominal tenderness.  Musculoskeletal:     Right lower leg: No edema.     Left lower leg: No edema.     Comments: +right loin tenderness  Lymphadenopathy:     Cervical: No cervical adenopathy.     Right cervical: No superficial, deep or posterior cervical adenopathy.    Left cervical: No superficial, deep or posterior cervical adenopathy.     Upper Body:     Right upper body: No supraclavicular or axillary adenopathy.     Left upper body: No supraclavicular or axillary adenopathy.  Neurological:     General: No focal deficit present.     Mental Status: He [name] alert and oriented to person, place, and time.  Psychiatric:        Mood and Affect: Mood normal.        Behavior: Behavior normal.     LABS:      Latest Ref Rng & Units 03/18/2023   12:02 PM 12/24/2022   10:10 AM 09/23/2022   12:34 PM  CBC  WBC 4.0 - 10.5 K/uL 5.9  5.2  5.0    Hemoglobin 13.0 - 17.0 g/dL 16.1  09.6  04.5   Hematocrit 39.0 - 52.0 % 43.7  41.2  42.8   Platelets 150 - 400 K/uL 145  127  127       Latest Ref Rng & Units 03/18/2023   12:02 PM 12/24/2022   10:10 AM 09/23/2022   12:34 PM  CMP  Glucose 70 - 99 mg/dL 409  811  914   BUN 6 - 20 mg/dL 17  16  17    Creatinine 0.61 - 1.24 mg/dL 7.82  9.56  2.13   Sodium 135 - 145 mmol/L 137  135  137   Potassium 3.5 - 5.1 mmol/L 4.2  3.7  4.5   Chloride 98 - 111 mmol/L 104  99  103   CO2 22 - 32 mmol/L 25  28  27    Calcium 8.9 - 10.3 mg/dL 9.2  8.5  8.9   Total Protein 6.5 - 8.1 g/dL 8.2  8.0  8.3   Total Bilirubin <1.2 mg/dL 1.2  1.3  1.3   Alkaline Phos 38 - 126 U/L 106  104  107   AST 15 - 41 U/L 30  24  27    ALT 0 - 44 U/L 21  18  21       Lab Results  Component Value Date   CEA1 1.6 11/13/2019   /  CEA  Date Value Ref Range Status  11/13/2019 1.6 0.0 - 4.7 ng/mL Final    Comment:    (NOTE)                             Nonsmokers          <  3.9                             Smokers             <5.6 Roche [employer]ostics Electrochemiluminescence Immunoassay (ECLIA) Values obtained with different assay methods or kits cannot be used interchangeably.  Results cannot be interpreted as absolute evidence of the presence or absence of malignant disease. Performed At: [facility] [address] [place], [place] [id number] Jolene Schimke MD WU:[id number]    No results found for: "PSA1" No results found for: "CAN199" No results found for: "CAN125"  No results found for: "TOTALPROTELP", "ALBUMINELP", "A1GS", "A2GS", "BETS", "BETA2SER", "GAMS", "MSPIKE", "SPEI" Lab Results  Component Value Date   TIBC 242 (L) 07/28/2019   FERRITIN 166 07/28/2019   IRONPCTSAT 41 (H) 07/28/2019   Lab Results  Component Value Date   LDH 147 11/13/2019   LDH 188 08/02/2019     STUDIES:   No results found.

## 2023-03-18 ENCOUNTER — Inpatient Hospital Stay: Payer: Medicare Other | Attending: Hematology

## 2023-03-18 ENCOUNTER — Inpatient Hospital Stay: Payer: Medicare Other | Admitting: Hematology

## 2023-03-18 ENCOUNTER — Inpatient Hospital Stay: Payer: Medicare Other

## 2023-03-18 VITALS — BP 138/82 | HR 69 | Temp 98.4°F | Resp 16 | Wt 304.0 lb

## 2023-03-18 DIAGNOSIS — C7A00 Malignant carcinoid tumor of unspecified site: Secondary | ICD-10-CM

## 2023-03-18 DIAGNOSIS — R188 Other ascites: Secondary | ICD-10-CM | POA: Diagnosis not present

## 2023-03-18 DIAGNOSIS — C7A098 Malignant carcinoid tumors of other sites: Secondary | ICD-10-CM | POA: Diagnosis present

## 2023-03-18 DIAGNOSIS — K7460 Unspecified cirrhosis of liver: Secondary | ICD-10-CM | POA: Diagnosis not present

## 2023-03-18 DIAGNOSIS — R609 Edema, unspecified: Secondary | ICD-10-CM | POA: Diagnosis not present

## 2023-03-18 LAB — CBC WITH DIFFERENTIAL/PLATELET
Abs Immature Granulocytes: 0.01 10*3/uL (ref 0.00–0.07)
Basophils Absolute: 0.1 10*3/uL (ref 0.0–0.1)
Basophils Relative: 1 %
Eosinophils Absolute: 0.4 10*3/uL (ref 0.0–0.5)
Eosinophils Relative: 6 %
HCT: 43.7 % (ref 39.0–52.0)
Hemoglobin: 14.1 g/dL (ref 13.0–17.0)
Immature Granulocytes: 0 %
Lymphocytes Relative: 27 %
Lymphs Abs: 1.6 10*3/uL (ref 0.7–4.0)
MCH: 31.3 pg (ref 26.0–34.0)
MCHC: 32.3 g/dL (ref 30.0–36.0)
MCV: 97.1 fL (ref 80.0–100.0)
Monocytes Absolute: 0.5 10*3/uL (ref 0.1–1.0)
Monocytes Relative: 9 %
Neutro Abs: 3.4 10*3/uL (ref 1.7–7.7)
Neutrophils Relative %: 57 %
Platelets: 145 10*3/uL — ABNORMAL LOW (ref 150–400)
RBC: 4.5 MIL/uL (ref 4.22–5.81)
RDW: 13 % (ref 11.5–15.5)
WBC: 5.9 10*3/uL (ref 4.0–10.5)
nRBC: 0 % (ref 0.0–0.2)

## 2023-03-18 LAB — COMPREHENSIVE METABOLIC PANEL
ALT: 21 U/L (ref 0–44)
AST: 30 U/L (ref 15–41)
Albumin: 3 g/dL — ABNORMAL LOW (ref 3.5–5.0)
Alkaline Phosphatase: 106 U/L (ref 38–126)
Anion gap: 8 (ref 5–15)
BUN: 17 mg/dL (ref 6–20)
CO2: 25 mmol/L (ref 22–32)
Calcium: 9.2 mg/dL (ref 8.9–10.3)
Chloride: 104 mmol/L (ref 98–111)
Creatinine, Ser: 1.01 mg/dL (ref 0.61–1.24)
GFR, Estimated: >60 mL/min (ref >60)
Glucose, Bld: 105 mg/dL — ABNORMAL HIGH (ref 70–99)
Potassium: 4.2 mmol/L (ref 3.5–5.1)
Sodium: 137 mmol/L (ref 135–145)
Total Bilirubin: 1.2 mg/dL — ABNORMAL HIGH (ref <1.2)
Total Protein: 8.2 g/dL — ABNORMAL HIGH (ref 6.5–8.1)

## 2023-03-18 MED ORDER — LANREOTIDE ACETATE 120 MG/0.5ML ~~LOC~~ SOLN
120.0000 mg | Freq: Once | SUBCUTANEOUS | Status: AC
  Administered 2023-03-18: 120 mg via SUBCUTANEOUS
  Filled 2023-03-18: qty 120

## 2023-03-18 NOTE — Patient Instructions (Signed)
[facility] at [facility] Discharge Instructions   You were seen and examined today by Dr. Ellin Saba.  He reviewed the results of your lab work which are normal. Your chromagranin A is pending.   Call [facility] to get your CT scan scheduled.  We will proceed with your injection today and every 4 weeks.   We will see you back in 3 months. We will repeat lab work at that time.  Return as scheduled.    Thank you for choosing [facility] at [facility] to provide your oncology and hematology care.  To afford each patient quality time with our provider, please arrive at least 15 minutes before your scheduled appointment time.   If you have a lab appointment with the Cancer Center please come in thru the Main Entrance and check in at the main information desk.  You need to re-schedule your appointment should you arrive 10 or more minutes late.  We strive to give you quality time with our providers, and arriving late affects you and other patients whose appointments are after yours.  Also, if you no show three or more times for appointments you may be dismissed from the clinic at the providers discretion.     Again, thank you for choosing [facility].  Our hope is that these requests will decrease the amount of time that you wait before being seen by our physicians.       _____________________________________________________________  Should you have questions after your visit to [facility], please contact our office at [phone] and follow the prompts.  Our office hours are 8:00 a.m. and 4:30 p.m. Monday - Friday.  Please note that voicemails left after 4:00 p.m. may not be returned until the following business day.  We are closed weekends and major holidays.  You do have access to a nurse 24-7, just call the main number to the clinic [phone] and do not press any options, hold on the line and a nurse will  answer the phone.    For prescription refill requests, have your pharmacy contact our office and allow 72 hours.    Due to Covid, you will need to wear a mask upon entering the hospital. If you do not have a mask, a mask will be given to you at the Main Entrance upon arrival. For doctor visits, patients may have 1 support person age 63 or older with them. For treatment visits, patients can not have anyone with them due to social distancing guidelines and our immunocompromised population.

## 2023-03-18 NOTE — Progress Notes (Signed)
Nicolas [name] presents today for injection per the provider's orders.  Labd and vital signs reviewed by MD.  Message received from Chapman Moss RN/Dr. Ellin Saba patient okay for injection.  Stable during administration without incident; injection site WNL; see MAR for injection details.  Patient tolerated procedure well and without incident.  No questions or complaints noted at this time.

## 2023-03-18 NOTE — Patient Instructions (Signed)
[facility] [place] - A DEPT OF [facility]  Discharge Instructions: Thank you for choosing [facility] to provide your oncology and hematology care.  If you have a lab appointment with the Cancer Center - please note that after April 8th, 2024, all labs will be drawn in the cancer center.  You do not have to check in or register with the main entrance as you have in the past but will complete your check-in in the cancer center.  Wear comfortable clothing and clothing appropriate for easy access to any Portacath or PICC line.   We strive to give you quality time with your provider. You may need to reschedule your appointment if you arrive late (15 or more minutes).  Arriving late affects you and other patients whose appointments are after yours.  Also, if you miss three or more appointments without notifying the office, you may be dismissed from the clinic at the provider's discretion.      For prescription refill requests, have your pharmacy contact our office and allow 72 hours for refills to be completed.    Today you received the following chemotherapy and/or immunotherapy agents Lanreotide      To help prevent nausea and vomiting after your treatment, we encourage you to take your nausea medication as directed.  BELOW ARE SYMPTOMS THAT SHOULD BE REPORTED IMMEDIATELY: *FEVER GREATER THAN 100.4 F (38 C) OR HIGHER *CHILLS OR SWEATING *NAUSEA AND VOMITING THAT IS NOT CONTROLLED WITH YOUR NAUSEA MEDICATION *UNUSUAL SHORTNESS OF BREATH *UNUSUAL BRUISING OR BLEEDING *URINARY PROBLEMS (pain or burning when urinating, or frequent urination) *BOWEL PROBLEMS (unusual diarrhea, constipation, pain near the anus) TENDERNESS IN MOUTH AND THROAT WITH OR WITHOUT PRESENCE OF ULCERS (sore throat, sores in mouth, or a toothache) UNUSUAL RASH, SWELLING OR PAIN  UNUSUAL VAGINAL DISCHARGE OR ITCHING   Items with * indicate a potential emergency and should be followed  up as soon as possible or go to the Emergency Department if any problems should occur.  Please show the CHEMOTHERAPY ALERT CARD or IMMUNOTHERAPY ALERT CARD at check-in to the Emergency Department and triage nurse.  Should you have questions after your visit or need to cancel or reschedule your appointment, please contact [facility] [place] - A DEPT OF [facility] [phone]  and follow the prompts.  Office hours are 8:00 a.m. to 4:30 p.m. Monday - Friday. Please note that voicemails left after 4:00 p.m. may not be returned until the following business day.  We are closed weekends and major holidays. You have access to a nurse at all times for urgent questions. Please call the main number to the clinic [phone] and follow the prompts.  For any non-urgent questions, you may also contact your provider using MyChart. We now offer e-Visits for anyone 62 and older to request care online for non-urgent symptoms. For details visit [email or web address].   Also download the MyChart app! Go to the app store, search "MyChart", open the app, select , and log in with your MyChart username and password.

## 2023-03-20 LAB — CHROMOGRANIN A: Chromogranin A (ng/mL): 202.5 ng/mL — ABNORMAL HIGH (ref 0.0–101.8)

## 2023-04-08 ENCOUNTER — Encounter: Payer: Self-pay | Admitting: Hematology

## 2023-04-15 ENCOUNTER — Inpatient Hospital Stay: Payer: Medicare Other | Attending: Hematology

## 2023-04-15 VITALS — BP 113/84 | HR 75 | Temp 96.6°F | Resp 20 | Wt 305.8 lb

## 2023-04-15 DIAGNOSIS — C7A098 Malignant carcinoid tumors of other sites: Secondary | ICD-10-CM | POA: Insufficient documentation

## 2023-04-15 DIAGNOSIS — C7A00 Malignant carcinoid tumor of unspecified site: Secondary | ICD-10-CM

## 2023-04-15 MED ORDER — LANREOTIDE ACETATE 120 MG/0.5ML ~~LOC~~ SOLN
120.0000 mg | Freq: Once | SUBCUTANEOUS | Status: AC
  Administered 2023-04-15: 120 mg via SUBCUTANEOUS
  Filled 2023-04-15: qty 120

## 2023-04-15 NOTE — Progress Notes (Signed)
 Lanreotide injection  given per orders. Patient tolerated it well without problems. Vitals stable and discharged home from clinic ambulatory. Follow up as scheduled.

## 2023-04-15 NOTE — Patient Instructions (Addendum)
 [facility] [place] - A DEPT OF [facility]  Discharge Instructions: Thank you for choosing [facility] to provide your oncology and hematology care.  If you have a lab appointment with the Cancer Center - please note that after April 8th, 2024, all labs will be drawn in the cancer center.  You do not have to check in or register with the main entrance as you have in the past but will complete your check-in in the cancer center.  Wear comfortable clothing and clothing appropriate for easy access to any Portacath or PICC line.   We strive to give you quality time with your provider. You may need to reschedule your appointment if you arrive late (15 or more minutes).  Arriving late affects you and other patients whose appointments are after yours.  Also, if you miss three or more appointments without notifying the office, you may be dismissed from the clinic at the provider's discretion.      For prescription refill requests, have your pharmacy contact our office and allow 72 hours for refills to be completed.    Today you received the following lenreotide injection   To help prevent nausea and vomiting after your treatment, we encourage you to take your nausea medication as directed.  BELOW ARE SYMPTOMS THAT SHOULD BE REPORTED IMMEDIATELY: *FEVER GREATER THAN 100.4 F (38 C) OR HIGHER *CHILLS OR SWEATING *NAUSEA AND VOMITING THAT IS NOT CONTROLLED WITH YOUR NAUSEA MEDICATION *UNUSUAL SHORTNESS OF BREATH *UNUSUAL BRUISING OR BLEEDING *URINARY PROBLEMS (pain or burning when urinating, or frequent urination) *BOWEL PROBLEMS (unusual diarrhea, constipation, pain near the anus) TENDERNESS IN MOUTH AND THROAT WITH OR WITHOUT PRESENCE OF ULCERS (sore throat, sores in mouth, or a toothache) UNUSUAL RASH, SWELLING OR PAIN  UNUSUAL VAGINAL DISCHARGE OR ITCHING   Items with * indicate a potential emergency and should be followed up as soon as possible or go to the  Emergency Department if any problems should occur.  Please show the CHEMOTHERAPY ALERT CARD or IMMUNOTHERAPY ALERT CARD at check-in to the Emergency Department and triage nurse.  Should you have questions after your visit or need to cancel or reschedule your appointment, please contact [facility] [place] - A DEPT OF [facility] [phone]  and follow the prompts.  Office hours are 8:00 a.m. to 4:30 p.m. Monday - Friday. Please note that voicemails left after 4:00 p.m. may not be returned until the following business day.  We are closed weekends and major holidays. You have access to a nurse at all times for urgent questions. Please call the main number to the clinic [phone] and follow the prompts.  For any non-urgent questions, you may also contact your provider using MyChart. We now offer e-Visits for anyone 44 and older to request care online for non-urgent symptoms. For details visit [email or web address].   Also download the MyChart app! Go to the app store, search MyChart, open the app, select [place], and log in with your MyChart username and password.

## 2023-05-13 ENCOUNTER — Inpatient Hospital Stay: Payer: Medicare Other

## 2023-05-13 VITALS — BP 120/81 | HR 70 | Temp 97.9°F | Resp 18

## 2023-05-13 DIAGNOSIS — C7A098 Malignant carcinoid tumors of other sites: Secondary | ICD-10-CM | POA: Diagnosis not present

## 2023-05-13 DIAGNOSIS — C7A00 Malignant carcinoid tumor of unspecified site: Secondary | ICD-10-CM

## 2023-05-13 MED ORDER — LANREOTIDE ACETATE 120 MG/0.5ML ~~LOC~~ SOLN
120.0000 mg | Freq: Once | SUBCUTANEOUS | Status: AC
  Administered 2023-05-13: 120 mg via SUBCUTANEOUS
  Filled 2023-05-13: qty 120

## 2023-05-13 NOTE — Patient Instructions (Addendum)
[facility] [place] - A DEPT OF [facility]  Discharge Instructions: Thank you for choosing [facility] to provide your oncology and hematology care.  If you have a lab appointment with the Cancer Center - please note that after April 8th, 2024, all labs will be drawn in the cancer center.  You do not have to check in or register with the main entrance as you have in the past but will complete your check-in in the cancer center.  Wear comfortable clothing and clothing appropriate for easy access to any Portacath or PICC line.   We strive to give you quality time with your provider. You may need to reschedule your appointment if you arrive late (15 or more minutes).  Arriving late affects you and other patients whose appointments are after yours.  Also, if you miss three or more appointments without notifying the office, you may be dismissed from the clinic at the provider's discretion.      For prescription refill requests, have your pharmacy contact our office and allow 72 hours for refills to be completed.    Today you received the following lanreotide injection   To help prevent nausea and vomiting after your treatment, we encourage you to take your nausea medication as directed.  BELOW ARE SYMPTOMS THAT SHOULD BE REPORTED IMMEDIATELY: *FEVER GREATER THAN 100.4 F (38 C) OR HIGHER *CHILLS OR SWEATING *NAUSEA AND VOMITING THAT IS NOT CONTROLLED WITH YOUR NAUSEA MEDICATION *UNUSUAL SHORTNESS OF BREATH *UNUSUAL BRUISING OR BLEEDING *URINARY PROBLEMS (pain or burning when urinating, or frequent urination) *BOWEL PROBLEMS (unusual diarrhea, constipation, pain near the anus) TENDERNESS IN MOUTH AND THROAT WITH OR WITHOUT PRESENCE OF ULCERS (sore throat, sores in mouth, or a toothache) UNUSUAL RASH, SWELLING OR PAIN  UNUSUAL VAGINAL DISCHARGE OR ITCHING   Items with * indicate a potential emergency and should be followed up as soon as possible or go to the  Emergency Department if any problems should occur.  Please show the CHEMOTHERAPY ALERT CARD or IMMUNOTHERAPY ALERT CARD at check-in to the Emergency Department and triage nurse.  Should you have questions after your visit or need to cancel or reschedule your appointment, please contact [facility] [place] - A DEPT OF [facility]igh[facility] [phone]  and follow the prompts.  Office hours are 8:00 a.m. to 4:30 p.m. Monday - Friday. Please note that voicemails left after 4:00 p.m. may not be returned until the following business day.  We are closed weekends and major holidays. You have access to a nurse at all times for urgent questions. Please call the main number to the clinic [phone] and follow the prompts.  For any non-urgent questions, you may also contact your provider using MyChart. We now offer e-Visits for anyone 48 and older to request care online for non-urgent symptoms. For details visit [email or web address].   Also download the MyChart app! Go to the app store, search "MyChart", open the app, select [place], and log in with your MyChart username and password.

## 2023-05-13 NOTE — Progress Notes (Signed)
Landreotide injection given per orders. Patient tolerated it well without problems. Vitals stable and discharged home from clinic ambulatory. Follow up as scheduled.

## 2023-06-09 ENCOUNTER — Other Ambulatory Visit: Payer: Self-pay

## 2023-06-09 DIAGNOSIS — K6389 Other specified diseases of intestine: Secondary | ICD-10-CM

## 2023-06-09 DIAGNOSIS — C7A00 Malignant carcinoid tumor of unspecified site: Secondary | ICD-10-CM

## 2023-06-10 ENCOUNTER — Inpatient Hospital Stay: Payer: Medicare Other | Attending: Hematology

## 2023-06-10 ENCOUNTER — Inpatient Hospital Stay: Payer: Medicare Other

## 2023-06-10 ENCOUNTER — Inpatient Hospital Stay (HOSPITAL_BASED_OUTPATIENT_CLINIC_OR_DEPARTMENT_OTHER): Payer: Medicare Other | Admitting: Hematology

## 2023-06-10 VITALS — BP 123/84 | HR 77 | Temp 96.7°F | Resp 20 | Wt 307.5 lb

## 2023-06-10 DIAGNOSIS — C7A098 Malignant carcinoid tumors of other sites: Secondary | ICD-10-CM | POA: Diagnosis present

## 2023-06-10 DIAGNOSIS — J90 Pleural effusion, not elsewhere classified: Secondary | ICD-10-CM | POA: Diagnosis not present

## 2023-06-10 DIAGNOSIS — C7A00 Malignant carcinoid tumor of unspecified site: Secondary | ICD-10-CM

## 2023-06-10 DIAGNOSIS — Z79899 Other long term (current) drug therapy: Secondary | ICD-10-CM | POA: Insufficient documentation

## 2023-06-10 DIAGNOSIS — K7460 Unspecified cirrhosis of liver: Secondary | ICD-10-CM

## 2023-06-10 DIAGNOSIS — R609 Edema, unspecified: Secondary | ICD-10-CM | POA: Diagnosis not present

## 2023-06-10 DIAGNOSIS — K6389 Other specified diseases of intestine: Secondary | ICD-10-CM

## 2023-06-10 DIAGNOSIS — R188 Other ascites: Secondary | ICD-10-CM | POA: Insufficient documentation

## 2023-06-10 LAB — COMPREHENSIVE METABOLIC PANEL
ALT: 20 U/L (ref 0–44)
AST: 30 U/L (ref 15–41)
Albumin: 2.8 g/dL — ABNORMAL LOW (ref 3.5–5.0)
Alkaline Phosphatase: 88 U/L (ref 38–126)
Anion gap: 8 (ref 5–15)
BUN: 18 mg/dL (ref 6–20)
CO2: 27 mmol/L (ref 22–32)
Calcium: 8.9 mg/dL (ref 8.9–10.3)
Chloride: 101 mmol/L (ref 98–111)
Creatinine, Ser: 1.27 mg/dL — ABNORMAL HIGH (ref 0.61–1.24)
GFR, Estimated: >60 mL/min (ref >60)
Glucose, Bld: 103 mg/dL — ABNORMAL HIGH (ref 70–99)
Potassium: 4.4 mmol/L (ref 3.5–5.1)
Sodium: 136 mmol/L (ref 135–145)
Total Bilirubin: 1.3 mg/dL — ABNORMAL HIGH (ref 0.0–1.2)
Total Protein: 8.1 g/dL (ref 6.5–8.1)

## 2023-06-10 LAB — CBC WITH DIFFERENTIAL/PLATELET
Abs Immature Granulocytes: 0.02 10*3/uL (ref 0.00–0.07)
Basophils Absolute: 0 10*3/uL (ref 0.0–0.1)
Basophils Relative: 1 %
Eosinophils Absolute: 0.3 10*3/uL (ref 0.0–0.5)
Eosinophils Relative: 6 %
HCT: 40.4 % (ref 39.0–52.0)
Hemoglobin: 13.4 g/dL (ref 13.0–17.0)
Immature Granulocytes: 0 %
Lymphocytes Relative: 25 %
Lymphs Abs: 1.2 10*3/uL (ref 0.7–4.0)
MCH: 31.9 pg (ref 26.0–34.0)
MCHC: 33.2 g/dL (ref 30.0–36.0)
MCV: 96.2 fL (ref 80.0–100.0)
Monocytes Absolute: 0.5 10*3/uL (ref 0.1–1.0)
Monocytes Relative: 11 %
Neutro Abs: 2.6 10*3/uL (ref 1.7–7.7)
Neutrophils Relative %: 57 %
Platelets: 134 10*3/uL — ABNORMAL LOW (ref 150–400)
RBC: 4.2 MIL/uL — ABNORMAL LOW (ref 4.22–5.81)
RDW: 13.9 % (ref 11.5–15.5)
WBC: 4.6 10*3/uL (ref 4.0–10.5)
nRBC: 0 % (ref 0.0–0.2)

## 2023-06-10 MED ORDER — LANREOTIDE ACETATE 120 MG/0.5ML ~~LOC~~ SOLN
120.0000 mg | Freq: Once | SUBCUTANEOUS | Status: AC
Start: 2023-06-10 — End: 2023-06-10
  Administered 2023-06-10: 120 mg via SUBCUTANEOUS
  Filled 2023-06-10: qty 120

## 2023-06-10 MED ORDER — FUROSEMIDE 40 MG PO TABS: 40.0000 mg | ORAL_TABLET | Freq: Every day | ORAL | 5 refills | Status: AC

## 2023-06-10 MED ORDER — CLOTRIMAZOLE-BETAMETHASONE 1-0.05 % EX CREA: 1.0000 | TOPICAL_CREAM | Freq: Two times a day (BID) | CUTANEOUS | 0 refills | Status: AC

## 2023-06-10 NOTE — Progress Notes (Signed)
 Patient tolerated Lanreotide injection with no complaints voiced.  Site clean and dry with no bruising or swelling noted.  No complaints of pain.  Discharged with vital signs stable and no signs or symptoms of distress noted.

## 2023-06-10 NOTE — Progress Notes (Signed)
 [facility] [address], [place] [address]    Clinic Day:  06/10/2023  Referring physician: Evie Lacks, MD  Patient Care Team: Evie Lacks, MD as PCP - General Rourk, Gerrit Friends, MD as Consulting Physician (Gastroenterology)   ASSESSMENT & PLAN:   Assessment: 1.   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  Possible mild nodular infiltration in the left upper quadrant mesentery and anterior omentum.  Multiple small mesenteric root nodes. -PET scan on 09/05/2019 showed 4.2 cm right anterior mesenteric mass with punctate calcifications, SUV 7.2.  Moderate ascites present.  No definite retroperitoneal adenopathy.  Liver is cirrhotic with spleen enlarged.  Diffusely elevated hepatic activity which is nonspecific. -CT-guided biopsy of the mesenteric mass on 10/09/2019 at [facility] consistent with well-differentiated neuroendocrine tumor (carcinoid tumor).  Tumor cells are strongly positive for chromogranin and synaptophysin.  Negative for CK7, CK20, TTF-1, PSA, NKX3.1, S100, HMB-45, and CD56. -Gallium dotatate PET CT scan on 11/08/2019 at [facility] shows large solid mesenteric midline abdomen mass with increased activity measuring 6.5 cm, SUV of 70.  In the uncinate process there is an area of increased activity visualized since seen on series 603 image 91 measuring 13.7 SUV.  Increased activity throughout the liver and spleen limiting evaluation for metastatic disease.  Hepatic activity is heterogeneous. -Lanreotide started on 11/13/2019. - CT CAP (07/11/2021): 4.6 cm soft tissue mass along the mesenteric root.  No significant abdominal adenopathy.  Small volume ascites.  Cirrhosis. - CT CAP (04/14/2022): Stable 4.6 cm soft tissue mass along the mesenteric root.  Cirrhosis.   Moderate volume ascites.  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   2.  Cirrhosis and ascites: -Thought to be secondary to hepatitis C. -Last paracentesis on 08/21/2019 with 4.8 L removed.    Plan: 1.  Well differentiated neuroendocrine tumor of the mesenteric mass: - Denies any flushing/wheezing/abdominal pains.  He has diarrhea lasting about 1 to 2 weeks after each lanreotide injection followed by constipation. - Labs today: Normal LFTs with albumin 2.8.  CBC grossly normal with mild thrombocytopenia stable.  Last chromogranin was 2021 stable. - CTAP (04/05/2023): 1.6 cm region of hyperenhancement along the hepatic dome which is new.  Stable 4.9 cm soft tissue mass along the mesenteric root.  Stable soft tissue stranding throughout the omentum could be reflective of third spacing of fluid.  Stable moderate left pleural effusion. - He will continue monthly lanreotide.  RTC 2 months for follow-up with repeat chromogranin level.  Will also repeat CTAP with contrast.  He was instructed to bring the disc of the scans for it to be loaded on our system.   2.  Cirrhosis and ascites: - He is status post hep C treatments in the past.  He had new hepatic dome lesion on CT scan from 04/04/2024.  Will keep an eye on it.    3.  Fluid retention: - Continue Lasix 40 mg daily and spironolactone 100 mg daily.  Swelling is well-controlled.    Orders Placed This Encounter  Procedures   CT ABDOMEN PELVIS W CONTRAST    Standing Status:   Future    Expected Date:   07/22/2023    Expiration Date:   06/09/2024    If indicated for the ordered procedure, I  authorize the administration of contrast media per Radiology protocol:   Yes    Does the patient have a contrast media/X-ray dye allergy?:   No    Preferred imaging location?:   External    If indicated for the ordered procedure, I authorize the administration of oral contrast media per  Radiology protocol:   Yes      I,Helena R Teague,acting as a scribe for Doreatha Massed, MD.,have documented all relevant documentation on the behalf of Doreatha Massed, MD,as directed by  Doreatha Massed, MD while in the presence of Doreatha Massed, MD.  I, Doreatha Massed MD, have reviewed the above documentation for accuracy and completeness, and I agree with the above.      Doreatha Massed, MD   2/27/20252:33 PM  CHIEF COMPLAINT:   Diagnosis: neuroendocrine tumor    Cancer Staging  No matching staging information was found for the patient.    Prior Therapy: none  Current Therapy:  Lanreotide monthly    HISTORY OF PRESENT ILLNESS:   Oncology History   No history exists.     INTERVAL HISTORY:   Nicolas [name] is a 58 y.o. male presenting to clinic today for follow up of neuroendocrine tumor. He was last seen by me on 03/18/23.  Since his last visit, he underwent CT A/P on 04/05/23 that found: New homogenous focus of hyper enhancement within the hepatic dome. This could represent an area of disease progression or hemangioma. Stable soft tissue mass along the mesenteric root. Cirrhosis. Moderate volume ascites. Stable visualized soft tissue stranding throughout the omentum could be reflective of third spacing of fluid or omental carcinomatosis. Stable moderate left pleural effusion.   Today, he states that he is doing well overall. His appetite level is at 100%. His energy level is at 50%. Nicolas [name] notes he needs a refill of furosemide. He states he has itchy bumps and dry skin on his back that he would like to receive a topical cream I previously prescribed to him to treat this in the past.   PAST MEDICAL HISTORY:   Past Medical History: Past Medical History:  Diagnosis Date   Cirrhosis (HCC)    Hepatitis C    Ab + 07/28/19. RNA 436,000   HTN (hypertension)     Surgical History: Past Surgical History:  Procedure Laterality Date   debridement  Right     per pt, debridement of rt hand    Social History: Social History   Socioeconomic History   Marital status: Divorced    Spouse name: Not on file   Number of children: 4   Years of education: Not on file   Highest education level: Not on file  Occupational History   Occupation: unemployed  Tobacco Use   Smoking status: Never   Smokeless tobacco: Current    Types: Snuff   Tobacco comments:    started using snuff since 1986  Vaping Use   Vaping status: Never Used  Substance and Sexual Activity   Alcohol use: Not Currently    Comment: occasional   Drug use: Never   Sexual activity: Not Currently  Other Topics Concern   Not on file  Social History Narrative   Not on file   Social Drivers of Health   Financial Resource Strain: Low Risk  (08/02/2019)   Overall Financial Resource Strain (CARDIA)    Difficulty of Paying Living Expenses: Not very hard  Food Insecurity: No Food Insecurity (08/02/2019)   Hunger Vital Sign    Worried About Running  Out of Food in the Last Year: Never true    Ran Out of Food in the Last Year: Never true  Transportation Needs: Unmet Transportation Needs (08/02/2019)   PRAPARE - Transportation    Lack of Transportation (Medical): Yes    Lack of Transportation (Non-Medical): Yes  Physical Activity: Inactive (08/02/2019)   Exercise Vital Sign    Days of Exercise per Week: 0 days    Minutes of Exercise per Session: 0 min  Stress: Stress Concern Present (08/02/2019)   [employer] of Occupational Health - Occupational Stress Questionnaire    Feeling of Stress : To some extent  Social Connections: Socially Isolated (08/02/2019)   Social Connection and Isolation Panel [NHANES]    Frequency of Communication with Friends and Family: Three times a week    Frequency of Social Gatherings with Friends and Family: Three times a week    Attends Religious Services: Never    Active Member of Clubs or Organizations: No    Attends Banker  Meetings: Never    Marital Status: Divorced  Catering manager Violence: Not At Risk (08/02/2019)   Humiliation, Afraid, Rape, and Kick questionnaire    Fear of Current or Ex-Partner: No    Emotionally Abused: No    Physically Abused: No    Sexually Abused: No    Family History: Family History  Problem Relation Age of Onset   Cirrhosis Sister        per patient, due to drug use.    Alzheimer's disease Mother    Cancer Mother    Diabetes Mother    Congestive Heart Failure Father    Diabetes Father    Alzheimer's disease Paternal Grandmother    Osteoporosis Paternal Grandmother    Diabetes Daughter    Colon cancer Neg Hx     Current Medications:  Current Outpatient Medications:    clotrimazole (LOTRIMIN) 1 % cream, Apply 1 application topically 2 (two) times daily., Disp: 30 g, Rfl: 3   clotrimazole-betamethasone (LOTRISONE) cream, Apply 1 Application topically 2 (two) times daily., Disp: 30 g, Rfl: 0   diphenoxylate-atropine (LOMOTIL) 2.5-0.025 MG tablet, Take 2 tablets after first watery stool, then 1 tablet after each watery stool.  No more than 8 tablets daily., Disp: 60 tablet, Rfl: 3   Ensure (ENSURE), Take 237 mLs by mouth every evening., Disp: , Rfl:    Hepatitis A-Hep B Recomb Vac (TWINRIX) 720-20 ELU-MCG/ML injection, Inject into the muscle. Pt has last shot in July, Disp: , Rfl:    pantoprazole (PROTONIX) 40 MG tablet, Take 1 tablet (40 mg total) by mouth daily., Disp: 30 tablet, Rfl: 3   propranolol (INDERAL) 20 MG tablet, Take 1 tablet by mouth daily., Disp: , Rfl:    sertraline (ZOLOFT) 50 MG tablet, Take 1 tablet (50 mg total) by mouth daily., Disp: 60 tablet, Rfl: 0   sildenafil (VIAGRA) 50 MG tablet, Take by mouth., Disp: , Rfl:    spironolactone (ALDACTONE) 100 MG tablet, Take one tablet in AM, Disp: 60 tablet, Rfl: 2   furosemide (LASIX) 40 MG tablet, Take 1 tablet (40 mg total) by mouth daily., Disp: 30 tablet, Rfl: 5 No current facility-administered  medications for this visit.  Facility-Administered Medications Ordered in Other Visits:    epoetin alfa-epbx (RETACRIT) 78295 UNIT/ML injection, , , ,    epoetin alfa-epbx (RETACRIT) 62130 UNIT/ML injection, , , ,    lanreotide acetate (SOMATULINE DEPOT) 120 MG/0.5ML injection, , , ,    lanreotide acetate (SOMATULINE  DEPOT) injection 120 mg, 120 mg, Subcutaneous, Once, Doreatha Massed, MD   Allergies: No Known Allergies  REVIEW OF SYSTEMS:   Review of Systems  Constitutional:  Negative for chills, fatigue and fever.  HENT:   Negative for lump/mass, mouth sores, nosebleeds, sore throat and trouble swallowing.   Eyes:  Negative for eye problems.  Respiratory:  Negative for cough and shortness of breath.   Cardiovascular:  Negative for chest pain, leg swelling and palpitations.  Gastrointestinal:  Positive for constipation and diarrhea. Negative for abdominal pain, nausea and vomiting.  Genitourinary:  Negative for bladder incontinence, difficulty urinating, dysuria, frequency, hematuria and nocturia.   Musculoskeletal:  Positive for back pain (7/10 severity). Negative for arthralgias, flank pain, myalgias and neck pain.  Skin:  Positive for itching (on back). Negative for rash.  Neurological:  Negative for dizziness, headaches and numbness.  Hematological:  Does not bruise/bleed easily.  Psychiatric/Behavioral:  Negative for depression, sleep disturbance and suicidal ideas. The patient is not nervous/anxious.   All other systems reviewed and are negative.    VITALS:   Blood pressure 123/84, pulse 77, temperature (!) 96.7 F (35.9 C), temperature source Tympanic, resp. rate 20, weight (!) 307 lb 8.7 oz (139.5 kg), SpO2 100%.  Wt Readings from Last 3 Encounters:  06/10/23 (!) 307 lb 8.7 oz (139.5 kg)  04/15/23 (!) 305 lb 12.5 oz (138.7 kg)  03/18/23 (!) 304 lb (137.9 kg)    Body mass index is 38.64 kg/m.  Performance status (ECOG): 1 - Symptomatic but completely  ambulatory  PHYSICAL EXAM:   Physical Exam Vitals and nursing note reviewed. Exam conducted with a chaperone present.  Constitutional:      Appearance: Normal appearance.  Cardiovascular:     Rate and Rhythm: Normal rate and regular rhythm.     Pulses: Normal pulses.     Heart sounds: Normal heart sounds.  Pulmonary:     Effort: Pulmonary effort is normal.     Breath sounds: Normal breath sounds.  Abdominal:     Palpations: Abdomen is soft. There is no hepatomegaly, splenomegaly or mass.     Tenderness: There is no abdominal tenderness.  Musculoskeletal:     Right lower leg: No edema.     Left lower leg: No edema.  Lymphadenopathy:     Cervical: No cervical adenopathy.     Right cervical: No superficial, deep or posterior cervical adenopathy.    Left cervical: No superficial, deep or posterior cervical adenopathy.     Upper Body:     Right upper body: No supraclavicular or axillary adenopathy.     Left upper body: No supraclavicular or axillary adenopathy.  Neurological:     General: No focal deficit present.     Mental Status: He is alert and oriented to person, place, and time.  Psychiatric:        Mood and Affect: Mood normal.        Behavior: Behavior normal.     LABS:      Latest Ref Rng & Units 06/10/2023   12:24 PM 03/18/2023   12:02 PM 12/24/2022   10:10 AM  CBC  WBC 4.0 - 10.5 K/uL 4.6  5.9  5.2   Hemoglobin 13.0 - 17.0 g/dL 16.1  09.6  04.5   Hematocrit 39.0 - 52.0 % 40.4  43.7  41.2   Platelets 150 - 400 K/uL 134  145  127       Latest Ref Rng & Units 06/10/2023   12:24  PM 03/18/2023   12:02 PM 12/24/2022   10:10 AM  CMP  Glucose 70 - 99 mg/dL 161  096  045   BUN 6 - 20 mg/dL 18  17  16    Creatinine 0.61 - 1.24 mg/dL 4.09  8.11  9.14   Sodium 135 - 145 mmol/L 136  137  135   Potassium 3.5 - 5.1 mmol/L 4.4  4.2  3.7   Chloride 98 - 111 mmol/L 101  104  99   CO2 22 - 32 mmol/L 27  25  28    Calcium 8.9 - 10.3 mg/dL 8.9  9.2  8.5   Total Protein 6.5 -  8.1 g/dL 8.1  8.2  8.0   Total Bilirubin 0.0 - 1.2 mg/dL 1.3  1.2  1.3   Alkaline Phos 38 - 126 U/L 88  106  104   AST 15 - 41 U/L 30  30  24    ALT 0 - 44 U/L 20  21  18       Lab Results  Component Value Date   CEA1 1.6 11/13/2019   /  CEA  Date Value Ref Range Status  11/13/2019 1.6 0.0 - 4.7 ng/mL Final    Comment:    (NOTE)                             Nonsmokers          <3.9                             Smokers             <5.6 Roche [employer]ostics Electrochemiluminescence Immunoassay (ECLIA) Values obtained with different assay methods or kits cannot be used interchangeably.  Results cannot be interpreted as absolute evidence of the presence or absence of malignant disease. Performed At: [facility] [address] [place], [place] [id number] Jolene Schimke MD [id number]    No results found for: "PSA1" No results found for: "CAN199" No results found for: "CAN125"  No results found for: "TOTALPROTELP", "ALBUMINELP", "A1GS", "A2GS", "BETS", "BETA2SER", "GAMS", "MSPIKE", "SPEI" Lab Results  Component Value Date   TIBC 242 (L) 07/28/2019   FERRITIN 166 07/28/2019   IRONPCTSAT 41 (H) 07/28/2019   Lab Results  Component Value Date   LDH 147 11/13/2019   LDH 188 08/02/2019     STUDIES:   No results found.

## 2023-06-10 NOTE — Patient Instructions (Signed)
 [facility] at [facility] Discharge Instructions   You were seen and examined today by Dr. Ellin Saba.  He reviewed the results of your lab work which are normal/stable.   He reviewed the results of your CT scan. There is a new spot on the liver that is likely benign. We will continue to monitor this.   We will proceed with your injection today.   Return as scheduled.    Thank you for choosing [facility] at [facility] to provide your oncology and hematology care.  To afford each patient quality time with our provider, please arrive at least 15 minutes before your scheduled appointment time.   If you have a lab appointment with the Cancer Center please come in thru the Main Entrance and check in at the main information desk.  You need to re-schedule your appointment should you arrive 10 or more minutes late.  We strive to give you quality time with our providers, and arriving late affects you and other patients whose appointments are after yours.  Also, if you no show three or more times for appointments you may be dismissed from the clinic at the providers discretion.     Again, thank you for choosing [facility].  Our hope is that these requests will decrease the amount of time that you wait before being seen by our physicians.       _____________________________________________________________  Should you have questions after your visit to [facility], please contact our office at [phone] and follow the prompts.  Our office hours are 8:00 a.m. and 4:30 p.m. Monday - Friday.  Please note that voicemails left after 4:00 p.m. may not be returned until the following business day.  We are closed weekends and major holidays.  You do have access to a nurse 24-7, just call the main number to the clinic [phone] and do not press any options, hold on the line and a nurse will answer the phone.    For prescription  refill requests, have your pharmacy contact our office and allow 72 hours.    Due to Covid, you will need to wear a mask upon entering the hospital. If you do not have a mask, a mask will be given to you at the Main Entrance upon arrival. For doctor visits, patients may have 1 support person age 15 or older with them. For treatment visits, patients can not have anyone with them due to social distancing guidelines and our immunocompromised population.

## 2023-06-10 NOTE — Patient Instructions (Signed)
 [facility] [place] - A DEPT OF [facility]  Discharge Instructions: Thank you for choosing [facility] to provide your oncology and hematology care.  If you have a lab appointment with the Cancer Center - please note that after April 8th, 2024, all labs will be drawn in the cancer center.  You do not have to check in or register with the main entrance as you have in the past but will complete your check-in in the cancer center.  Wear comfortable clothing and clothing appropriate for easy access to any Portacath or PICC line.   We strive to give you quality time with your provider. You may need to reschedule your appointment if you arrive late (15 or more minutes).  Arriving late affects you and other patients whose appointments are after yours.  Also, if you miss three or more appointments without notifying the office, you may be dismissed from the clinic at the provider's discretion.      For prescription refill requests, have your pharmacy contact our office and allow 72 hours for refills to be completed.    Today you received the following chemotherapy and/or immunotherapy agents Lanreotide.  Lanreotide Injection What is this medication? LANREOTIDE (lan REE oh tide) treats high levels of growth hormone (acromegaly). It is used when other therapies have not worked well enough or cannot be tolerated. It works by reducing the amount of growth hormone your body makes. This reduces symptoms and the risk of health problems caused by too much growth hormone, such as diabetes and heart disease. It may also be used to treat neuroendocrine tumors, a cancer of the cells that release hormones and other substances in your body. It works by slowing down the release of these substances from the cells. This slows tumor growth. It also decreases the symptoms of carcinoid syndrome, such as flushing or diarrhea. This medicine may be used for other purposes; ask your health care  provider or pharmacist if you have questions. COMMON BRAND NAME(S): Somatuline Depot What should I tell my care team before I take this medication? They need to know if you have any of these conditions: Diabetes Gallbladder disease Heart disease Kidney disease Liver disease Pancreatic disease Thyroid disease An unusual or allergic reaction to lanreotide, other medications, foods, dyes, or preservatives Pregnant or trying to get pregnant Breastfeeding How should I use this medication? This medication is injected under the skin. It is given by your care team in a hospital or clinic setting. Talk to your care team about the use of this medication in children. Special care may be needed. Overdosage: If you think you have taken too much of this medicine contact a poison control center or emergency room at once. NOTE: This medicine is only for you. Do not share this medicine with others. What if I miss a dose? Keep appointments for follow-up doses. It is important not to miss your dose. Call your care team if you are unable to keep an appointment. What may interact with this medication? Bromocriptine Cyclosporine Certain medications for blood pressure, heart disease, irregular heartbeat Certain medications for diabetes Quinidine Terfenadine This list may not describe all possible interactions. Give your health care provider a list of all the medicines, herbs, non-prescription drugs, or dietary supplements you use. Also tell them if you smoke, drink alcohol, or use illegal drugs. Some items may interact with your medicine. What should I watch for while using this medication? Visit your care team  for regular checks on your progress. Tell your care team if your symptoms do not start to get better or if they get worse. Your condition will be monitored carefully while you are receiving this medication. You may need blood work while you are taking this medication. This medication may increase  blood sugar. The risk may be higher in patients who already have diabetes. Ask your care team what you can do to lower your risk of diabetes while taking this medication. Talk to your care team if you wish to become pregnant or think you may be pregnant. This medication can cause serious birth defects. Do not breast-feed while taking this medication and for 6 months after stopping therapy. This medication may cause infertility. Talk to your care team if you are concerned about your fertility. What side effects may I notice from receiving this medication? Side effects that you should report to your care team as soon as possible: Allergic reactions--skin rash, itching, hives, swelling of the face, lips, tongue, or throat Gallbladder problems--severe stomach pain, nausea, vomiting, fever High blood sugar (hyperglycemia)--increased thirst or amount of urine, unusual weakness or fatigue, blurry vision Increase in blood pressure Low blood sugar (hypoglycemia)--pale, blue or purple skin or lips, sweating, fussiness, rapid heartbeat, poor feeding, low body temperature Low thyroid levels (hypothyroidism)--unusual weakness or fatigue, increased sensitivity to cold, constipation, hair loss, dry skin, weight gain, feelings of depression Oily or light-colored stools, diarrhea, bloating, weight loss Slow heartbeat--dizziness, feeling faint or lightheaded, confusion, trouble breathing, unusual weakness or fatigue Side effects that usually do not require medical attention (report these to your care team if they continue or are bothersome): Diarrhea Dizziness Headache Muscle spasms Nausea Pain, redness, or irritation at injection site Stomach pain This list may not describe all possible side effects. Call your doctor for medical advice about side effects. You may report side effects to FDA at 1-800-FDA-1088. Where should I keep my medication? This medication is given in a hospital or clinic. It will not be  stored at home. NOTE: This sheet is a summary. It may not cover all possible information. If you have questions about this medicine, talk to your doctor, pharmacist, or health care provider.  2024 Elsevier/Gold Standard (2023-03-12 00:00:00)       To help prevent nausea and vomiting after your treatment, we encourage you to take your nausea medication as directed.  BELOW ARE SYMPTOMS THAT SHOULD BE REPORTED IMMEDIATELY: *FEVER GREATER THAN 100.4 F (38 C) OR HIGHER *CHILLS OR SWEATING *NAUSEA AND VOMITING THAT IS NOT CONTROLLED WITH YOUR NAUSEA MEDICATION *UNUSUAL SHORTNESS OF BREATH *UNUSUAL BRUISING OR BLEEDING *URINARY PROBLEMS (pain or burning when urinating, or frequent urination) *BOWEL PROBLEMS (unusual diarrhea, constipation, pain near the anus) TENDERNESS IN MOUTH AND THROAT WITH OR WITHOUT PRESENCE OF ULCERS (sore throat, sores in mouth, or a toothache) UNUSUAL RASH, SWELLING OR PAIN  UNUSUAL VAGINAL DISCHARGE OR ITCHING   Items with * indicate a potential emergency and should be followed up as soon as possible or go to the Emergency Department if any problems should occur.  Please show the CHEMOTHERAPY ALERT CARD or IMMUNOTHERAPY ALERT CARD at check-in to the Emergency Department and triage nurse.  Should you have questions after your visit or need to cancel or reschedule your appointment, please contact [facility]  - A DEPT OF Eligha Bridegroom [facility] [phone]  and follow the prompts.  Office hours are 8:00 a.m. to 4:30 p.m. Monday - Friday. Please note that voicemails  left after 4:00 p.m. may not be returned until the following business day.  We are closed weekends and major holidays. You have access to a nurse at all times for urgent questions. Please call the main number to the clinic [phone] and follow the prompts.  For any non-urgent questions, you may also contact your provider using MyChart. We now offer e-Visits for anyone 63 and older to  request care online for non-urgent symptoms. For details visit [email or web address].   Also download the MyChart app! Go to the app store, search "MyChart", open the app, select [place], and log in with your MyChart username and password.

## 2023-06-11 LAB — CHROMOGRANIN A: Chromogranin A (ng/mL): 220.4 ng/mL — ABNORMAL HIGH (ref 0.0–101.8)

## 2023-07-08 ENCOUNTER — Inpatient Hospital Stay: Payer: Medicare Other | Attending: Hematology

## 2023-07-08 VITALS — BP 133/88 | HR 66 | Temp 99.0°F | Resp 20

## 2023-07-08 DIAGNOSIS — C7A098 Malignant carcinoid tumors of other sites: Secondary | ICD-10-CM | POA: Diagnosis present

## 2023-07-08 DIAGNOSIS — C7A00 Malignant carcinoid tumor of unspecified site: Secondary | ICD-10-CM

## 2023-07-08 MED ORDER — LANREOTIDE ACETATE 120 MG/0.5ML ~~LOC~~ SOLN
120.0000 mg | Freq: Once | SUBCUTANEOUS | Status: AC
Start: 2023-07-08 — End: 2023-07-08
  Administered 2023-07-08: 120 mg via SUBCUTANEOUS
  Filled 2023-07-08: qty 120

## 2023-07-08 NOTE — Patient Instructions (Signed)
 [facility] [place] [facility]  Discharge Instructions: Thank you for choosing [facility] to provide your oncology and hematology care.  If you have a lab appointment with the Cancer Center - please note that after April 8th, 2024, all labs will be drawn in the cancer center.  You do not have to check in or register with the main entrance as you have in the past but will complete your check-in in the cancer center.  Wear comfortable clothing and clothing appropriate for easy access to any Portacath or PICC line.   We strive to give you quality time with your provider. You may need to reschedule your appointment if you arrive late (15 or more minutes).  Arriving late affects you and other patients whose appointments are after yours.  Also, if you miss three or more appointments without notifying the office, you may be dismissed from the clinic at the provider's discretion.      For prescription refill requests, have your pharmacy contact our office and allow 72 hours for refills to be completed.    Today you received the following chemotherapy and/or immunotherapy agents Lanreotide      To help prevent nausea and vomiting after your treatment, we encourage you to take your nausea medication as directed.  BELOW ARE SYMPTOMS THAT SHOULD BE REPORTED IMMEDIATELY: *FEVER GREATER THAN 100.4 F (38 C) OR HIGHER *CHILLS OR SWEATING *NAUSEA AND VOMITING THAT IS NOT CONTROLLED WITH YOUR NAUSEA MEDICATION *UNUSUAL SHORTNESS OF BREATH *UNUSUAL BRUISING OR BLEEDING *URINARY PROBLEMS (pain or burning when urinating, or frequent urination) *BOWEL PROBLEMS (unusual diarrhea, constipation, pain near the anus) TENDERNESS IN MOUTH AND THROAT WITH OR WITHOUT PRESENCE OF ULCERS (sore throat, sores in mouth, or a toothache) UNUSUAL RASH, SWELLING OR PAIN  UNUSUAL VAGINAL DISCHARGE OR ITCHING   Items with * indicate a potential emergency and should be followed  up as soon as possible or go to the Emergency Department if any problems should occur.  Please show the CHEMOTHERAPY ALERT CARD or IMMUNOTHERAPY ALERT CARD at check-in to the Emergency Department and triage nurse.  Should you have questions after your visit or need to cancel or reschedule your appointment, please contact [facility] [place] - A DEPT OF [facility] [phone]  and follow the prompts.  Office hours are 8:00 a.m. to 4:30 p.m. Monday - Friday. Please note that voicemails left after 4:00 p.m. may not be returned until the following business day.  We are closed weekends and major holidays. You have access to a nurse at all times for urgent questions. Please call the main number to the clinic [phone] and follow the prompts.  For any non-urgent questions, you may also contact your provider using MyChart. We now offer e-Visits for anyone 62 and older to request care online for non-urgent symptoms. For details visit [email or web address].   Also download the MyChart app! Go to the app store, search "MyChart", open the app, select , and log in with your MyChart username and password.

## 2023-07-08 NOTE — Progress Notes (Signed)
 Patient presents today for Lanreotide injection per providers order.  Stable during administration without incident; injection site WNL; see MAR for injection details.  Patient tolerated procedure well and without incident.  No questions or complaints noted at this time.

## 2023-08-04 ENCOUNTER — Other Ambulatory Visit: Payer: Self-pay

## 2023-08-04 DIAGNOSIS — C7A00 Malignant carcinoid tumor of unspecified site: Secondary | ICD-10-CM

## 2023-08-05 ENCOUNTER — Inpatient Hospital Stay (HOSPITAL_BASED_OUTPATIENT_CLINIC_OR_DEPARTMENT_OTHER): Payer: Medicare Other | Admitting: Hematology

## 2023-08-05 ENCOUNTER — Inpatient Hospital Stay: Payer: Medicare Other

## 2023-08-05 ENCOUNTER — Inpatient Hospital Stay: Payer: Medicare Other | Attending: Hematology

## 2023-08-05 DIAGNOSIS — D696 Thrombocytopenia, unspecified: Secondary | ICD-10-CM | POA: Diagnosis not present

## 2023-08-05 DIAGNOSIS — C7A00 Malignant carcinoid tumor of unspecified site: Secondary | ICD-10-CM

## 2023-08-05 DIAGNOSIS — K7460 Unspecified cirrhosis of liver: Secondary | ICD-10-CM | POA: Diagnosis not present

## 2023-08-05 DIAGNOSIS — C7A098 Malignant carcinoid tumors of other sites: Secondary | ICD-10-CM | POA: Diagnosis present

## 2023-08-05 DIAGNOSIS — R188 Other ascites: Secondary | ICD-10-CM | POA: Insufficient documentation

## 2023-08-05 LAB — CBC WITH DIFFERENTIAL/PLATELET
Abs Immature Granulocytes: 0.01 10*3/uL (ref 0.00–0.07)
Basophils Absolute: 0 10*3/uL (ref 0.0–0.1)
Basophils Relative: 1 %
Eosinophils Absolute: 0.2 10*3/uL (ref 0.0–0.5)
Eosinophils Relative: 5 %
HCT: 39.4 % (ref 39.0–52.0)
Hemoglobin: 13.2 g/dL (ref 13.0–17.0)
Immature Granulocytes: 0 %
Lymphocytes Relative: 26 %
Lymphs Abs: 1.2 10*3/uL (ref 0.7–4.0)
MCH: 32.1 pg (ref 26.0–34.0)
MCHC: 33.5 g/dL (ref 30.0–36.0)
MCV: 95.9 fL (ref 80.0–100.0)
Monocytes Absolute: 0.4 10*3/uL (ref 0.1–1.0)
Monocytes Relative: 9 %
Neutro Abs: 2.6 10*3/uL (ref 1.7–7.7)
Neutrophils Relative %: 59 %
Platelets: 143 10*3/uL — ABNORMAL LOW (ref 150–400)
RBC: 4.11 MIL/uL — ABNORMAL LOW (ref 4.22–5.81)
RDW: 13.7 % (ref 11.5–15.5)
WBC: 4.4 10*3/uL (ref 4.0–10.5)
nRBC: 0 % (ref 0.0–0.2)

## 2023-08-05 LAB — COMPREHENSIVE METABOLIC PANEL WITH GFR
ALT: 18 U/L (ref 0–44)
AST: 30 U/L (ref 15–41)
Albumin: 2.8 g/dL — ABNORMAL LOW (ref 3.5–5.0)
Alkaline Phosphatase: 92 U/L (ref 38–126)
Anion gap: 9 (ref 5–15)
BUN: 13 mg/dL (ref 6–20)
CO2: 25 mmol/L (ref 22–32)
Calcium: 9 mg/dL (ref 8.9–10.3)
Chloride: 102 mmol/L (ref 98–111)
Creatinine, Ser: 1.13 mg/dL (ref 0.61–1.24)
GFR, Estimated: >60 mL/min (ref >60)
Glucose, Bld: 143 mg/dL — ABNORMAL HIGH (ref 70–99)
Potassium: 4.1 mmol/L (ref 3.5–5.1)
Sodium: 136 mmol/L (ref 135–145)
Total Bilirubin: 1.1 mg/dL (ref 0.0–1.2)
Total Protein: 8 g/dL (ref 6.5–8.1)

## 2023-08-05 MED ORDER — PANTOPRAZOLE SODIUM 40 MG PO TBEC: 40.0000 mg | DELAYED_RELEASE_TABLET | Freq: Every day | ORAL | 3 refills | Status: AC

## 2023-08-05 MED ORDER — LANREOTIDE ACETATE 120 MG/0.5ML ~~LOC~~ SOLN
120.0000 mg | Freq: Once | SUBCUTANEOUS | Status: AC
  Administered 2023-08-05: 120 mg via SUBCUTANEOUS
  Filled 2023-08-05: qty 120

## 2023-08-05 NOTE — Patient Instructions (Addendum)
 [facility] at [facility] Discharge Instructions   You were seen and examined today by Dr. Cheree Cords.  He reviewed the results of your lab work which are normal/stable.   Increase spironolactone  to 200 mg.   We will proceed with your injection today.   Return as scheduled.    Thank you for choosing [facility] at [facility] to provide your oncology and hematology care.  To afford each patient quality time with our provider, please arrive at least 15 minutes before your scheduled appointment time.   If you have a lab appointment with the Cancer Center please come in thru the Main Entrance and check in at the main information desk.  You need to re-schedule your appointment should you arrive 10 or more minutes late.  We strive to give you quality time with our providers, and arriving late affects you and other patients whose appointments are after yours.  Also, if you no show three or more times for appointments you may be dismissed from the clinic at the providers discretion.     Again, thank you for choosing [facility].  Our hope is that these requests will decrease the amount of time that you wait before being seen by our physicians.       _____________________________________________________________  Should you have questions after your visit to [facility], please contact our office at [phone] and follow the prompts.  Our office hours are 8:00 a.m. and 4:30 p.m. Monday - Friday.  Please note that voicemails left after 4:00 p.m. may not be returned until the following business day.  We are closed weekends and major holidays.  You do have access to a nurse 24-7, just call the main number to the clinic [phone] and do not press any options, hold on the line and a nurse will answer the phone.    For prescription refill requests, have your pharmacy contact our office and allow 72 hours.    Due to Covid, you  will need to wear a mask upon entering the hospital. If you do not have a mask, a mask will be given to you at the Main Entrance upon arrival. For doctor visits, patients may have 1 support person age 59 or older with them. For treatment visits, patients can not have anyone with them due to social distancing guidelines and our immunocompromised population.

## 2023-08-05 NOTE — Progress Notes (Signed)
 Nicolas [name] presents today for injection per the provider's orders. Lanreotide 120 mg administration without incident; injection site WNL; see MAR for injection details.  Patient tolerated procedure well and without incident.  No questions or complaints noted at this time.   Discharged from clinic ambulatory in stable condition. Alert and oriented x 3. F/U with [facility] as scheduled.

## 2023-08-05 NOTE — Progress Notes (Signed)
 [facility] [address], [place] [address]    Clinic Day:  08/05/2023  Referring physician: Shirlee Dotter, MD  Patient Care Team: Shirlee Dotter, MD as PCP - General Rourk, Windsor Hatcher, MD as Consulting Physician (Gastroenterology)   ASSESSMENT & PLAN:   Assessment: 1.   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  Possible mild nodular infiltration in the left upper quadrant mesentery and anterior omentum.  Multiple small mesenteric root nodes. -PET scan on 09/05/2019 showed 4.2 cm right anterior mesenteric mass with punctate calcifications, SUV 7.2.  Moderate ascites present.  No definite retroperitoneal adenopathy.  Liver is cirrhotic with spleen enlarged.  Diffusely elevated hepatic activity which is nonspecific. -CT-guided biopsy of the mesenteric mass on 10/09/2019 at [facility] consistent with well-differentiated neuroendocrine tumor (carcinoid tumor).  Tumor cells are strongly positive for chromogranin and synaptophysin.  Negative for CK7, CK20, TTF-1, PSA, NKX3.1, S100, HMB-45, and CD56. -Gallium dotatate PET CT scan on 11/08/2019 at [facility] shows large solid mesenteric midline abdomen mass with increased activity measuring 6.5 cm, SUV of 70.  In the uncinate process there is an area of increased activity visualized since seen on series 603 image 91 measuring 13.7 SUV.  Increased activity throughout the liver and spleen limiting evaluation for metastatic disease.  Hepatic activity is heterogeneous. -Lanreotide started on 11/13/2019. - CT CAP (07/11/2021): 4.6 cm soft tissue mass along the mesenteric root.  No significant abdominal adenopathy.  Small volume ascites.  Cirrhosis. - CT CAP (04/14/2022): Stable 4.6 cm soft tissue mass along the mesenteric root.  Cirrhosis.   Moderate volume ascites.  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   2.  Cirrhosis and ascites: -Thought to be secondary to hepatitis C. -Last paracentesis on 08/21/2019 with 4.8 L removed.    Plan: 1.  Well differentiated neuroendocrine tumor of the mesenteric mass: - CTAP (04/05/2023): 1.6 cm region of hyperenhancement along the hepatic dome which is new.  Stable 4.9 cm soft tissue mass along the mesenteric root.  Stable soft tissue stranding throughout the omentum reflective of third spacing of fluid.  Stable moderate left pleural effusion. - He does not have any signs or symptoms of carcinoid syndrome.  He is tolerating lanreotide monthly very well. - Reviewed labs from 08/05/2023: LFTs are normal.  Allman is still low at 2.8.  CBC grossly normal with mild thrombocytopenia stable.  Chromogranin a was 220 more or less stable. - He reports acid reflux symptoms.  Will send in Protonix  40 mg daily. - He has not heard from [facility] imaging center about his subsequent CT scan.  We will resend the order and have it done prior to next visit.  Today he may proceed with lanreotide injection.    2.  Cirrhosis and ascites: - He is status post hep C treatments in the past.  New hepatic dome lesion on the CT scan from 04/04/2024.  Will monitor it on subsequent CT scan.    3.  Fluid retention: - He is taking Lasix  40 mg daily and spironolactone  100 mg daily.  He reported abdominal tightness and has gained about 5 pounds.  No extremity edema. - Recommend increasing spironolactone  to 100 mg twice daily and continue Lasix  once daily.    No orders of the defined  types were placed in this encounter.     Nadeen Augusta Teague,acting as a Neurosurgeon for Paulett Boros, MD.,have documented all relevant documentation on the behalf of Paulett Boros, MD,as directed by  Paulett Boros, MD while in the presence of Paulett Boros,  MD.  I, Paulett Boros MD, have reviewed the above documentation for accuracy and completeness, and I agree with the above.     Paulett Boros, MD   4/24/20253:33 PM  CHIEF COMPLAINT:   Diagnosis: neuroendocrine tumor    Cancer Staging  No matching staging information was found for the patient.    Prior Therapy: none  Current Therapy:  Lanreotide monthly    HISTORY OF PRESENT ILLNESS:   Oncology History   No history exists.     INTERVAL HISTORY:   Jeziah is a 58 y.o. male presenting to clinic today for follow up of neuroendocrine tumor. He was last seen by me on 06/10/23.  Today, he states that he is doing well overall. His appetite level is at 75%. His energy level is at 75%. Wade denies any changes in BM's, flushing, or wheezing. He reports occasion GERD symptoms similar to heart burn and acid reflux in the stomach. Symptoms worsen when eating after 4 pm. He has not tried any OTC medications nor Pepcid. He has previously taken Protonix  for acid reflux in 2023, which improved symptoms at that time.   Renwick is taking Lasix  and spironolactone  as prescribed.   Kaid reports decreased oral intake, though he has gained 5 pounds since his last visit. He notes abdominal tightness.   PAST MEDICAL HISTORY:   Past Medical History: Past Medical History:  Diagnosis Date   Cirrhosis (HCC)    Hepatitis C    Ab + 07/28/19. RNA 436,000   HTN (hypertension)     Surgical History: Past Surgical History:  Procedure Laterality Date   debridement  Right    per pt, debridement of rt hand    Social History: Social History   Socioeconomic History   Marital status: Divorced    Spouse name: Not on file   Number of children: 4   Years of education: Not on file   Highest education level: Not on file  Occupational History   Occupation: unemployed  Tobacco Use   Smoking status: Never   Smokeless tobacco: Current    Types: Snuff   Tobacco comments:    started using  snuff since 1986  Vaping Use   Vaping status: Never Used  Substance and Sexual Activity   Alcohol use: Not Currently    Comment: occasional   Drug use: Never   Sexual activity: Not Currently  Other Topics Concern   Not on file  Social History Narrative   Not on file   Social Drivers of Health   Financial Resource Strain: Low Risk  (08/02/2019)   Overall Financial Resource Strain (CARDIA)    Difficulty of Paying Living Expenses: Not very hard  Food Insecurity: No Food Insecurity (08/02/2019)   Hunger Vital Sign    Worried About Running Out of Food in the Last Year: Never true    Ran Out of Food in the Last Year: Never true  Transportation Needs: Unmet Transportation Needs (08/02/2019)   PRAPARE - Transportation    Lack of Transportation (Medical): Yes    Lack of Transportation (Non-Medical): Yes  Physical Activity: Inactive (08/02/2019)   Exercise Vital Sign    Days of Exercise per Week: 0 days    Minutes of Exercise per Session:  0 min  Stress: Stress Concern Present (08/02/2019)   [employer] of Occupational Health - Occupational Stress Questionnaire    Feeling of Stress : To some extent  Social Connections: Socially Isolated (08/02/2019)   Social Connection and Isolation Panel [NHANES]    Frequency of Communication with Friends and Family: Three times a week    Frequency of Social Gatherings with Friends and Family: Three times a week    Attends Religious Services: Never    Active Member of Clubs or Organizations: No    Attends Banker Meetings: Never    Marital Status: Divorced  Catering manager Violence: Not At Risk (08/02/2019)   Humiliation, Afraid, Rape, and Kick questionnaire    Fear of Current or Ex-Partner: No    Emotionally Abused: No    Physically Abused: No    Sexually Abused: No    Family History: Family History  Problem Relation Age of Onset   Cirrhosis Sister        per patient, due to drug use.    Alzheimer's disease Mother    Cancer  Mother    Diabetes Mother    Congestive Heart Failure Father    Diabetes Father    Alzheimer's disease Paternal Grandmother    Osteoporosis Paternal Grandmother    Diabetes Daughter    Colon cancer Neg Hx     Current Medications:  Current Outpatient Medications:    clotrimazole  (LOTRIMIN ) 1 % cream, Apply 1 application topically 2 (two) times daily., Disp: 30 g, Rfl: 3   clotrimazole -betamethasone  (LOTRISONE ) cream, Apply 1 Application topically 2 (two) times daily., Disp: 30 g, Rfl: 0   diphenoxylate -atropine  (LOMOTIL ) 2.5-0.025 MG tablet, Take 2 tablets after first watery stool, then 1 tablet after each watery stool.  No more than 8 tablets daily., Disp: 60 tablet, Rfl: 3   Ensure (ENSURE), Take 237 mLs by mouth every evening., Disp: , Rfl:    furosemide  (LASIX ) 40 MG tablet, Take 1 tablet (40 mg total) by mouth daily., Disp: 30 tablet, Rfl: 5   Hepatitis A-Hep B Recomb Vac (TWINRIX) 720-20 ELU-MCG/ML injection, Inject into the muscle. Pt has last shot in July, Disp: , Rfl:    propranolol (INDERAL) 20 MG tablet, Take 1 tablet by mouth daily., Disp: , Rfl:    sertraline  (ZOLOFT ) 50 MG tablet, Take 1 tablet (50 mg total) by mouth daily., Disp: 60 tablet, Rfl: 0   sildenafil (VIAGRA) 50 MG tablet, Take by mouth., Disp: , Rfl:    spironolactone  (ALDACTONE ) 100 MG tablet, Take one tablet in AM, Disp: 60 tablet, Rfl: 2   pantoprazole  (PROTONIX ) 40 MG tablet, Take 1 tablet (40 mg total) by mouth daily., Disp: 30 tablet, Rfl: 3 No current facility-administered medications for this visit.  Facility-Administered Medications Ordered in Other Visits:    epoetin  alfa-epbx (RETACRIT ) 20000 UNIT/ML injection, , , ,    epoetin  alfa-epbx (RETACRIT ) 40000 UNIT/ML injection, , , ,    lanreotide acetate  (SOMATULINE DEPOT ) 120 MG/0.5ML injection, , , ,    Allergies: No Known Allergies  REVIEW OF SYSTEMS:   Review of Systems  Constitutional:  Negative for chills, fatigue and fever.  HENT:    Negative for lump/mass, mouth sores, nosebleeds, sore throat and trouble swallowing.   Eyes:  Negative for eye problems.  Respiratory:  Negative for cough and shortness of breath.   Cardiovascular:  Negative for chest pain, leg swelling and palpitations.  Gastrointestinal:  Positive for constipation and diarrhea. Negative for abdominal pain, nausea and  vomiting.  Genitourinary:  Negative for bladder incontinence, difficulty urinating, dysuria, frequency, hematuria and nocturia.   Musculoskeletal:  Negative for arthralgias, back pain, flank pain, myalgias and neck pain.  Skin:  Negative for itching and rash.  Neurological:  Negative for dizziness, headaches and numbness.  Hematological:  Does not bruise/bleed easily.  Psychiatric/Behavioral:  Negative for depression, sleep disturbance and suicidal ideas. The patient is not nervous/anxious.   All other systems reviewed and are negative.    VITALS:   Blood pressure 111/74, pulse 72, temperature 97.8 F (36.6 C), temperature source Oral, resp. rate 16, weight (!) 312 lb 13.3 oz (141.9 kg), SpO2 100%.  Wt Readings from Last 3 Encounters:  08/05/23 (!) 312 lb 13.3 oz (141.9 kg)  06/10/23 (!) 307 lb 8.7 oz (139.5 kg)  04/15/23 (!) 305 lb 12.5 oz (138.7 kg)    Body mass index is 39.31 kg/m.  Performance status (ECOG): 1 - Symptomatic but completely ambulatory  PHYSICAL EXAM:   Physical Exam Vitals and nursing note reviewed. Exam conducted with a chaperone present.  Constitutional:      Appearance: Normal appearance.  Cardiovascular:     Rate and Rhythm: Normal rate and regular rhythm.     Pulses: Normal pulses.     Heart sounds: Normal heart sounds.  Pulmonary:     Effort: Pulmonary effort is normal.     Breath sounds: Normal breath sounds.  Abdominal:     Palpations: Abdomen is soft. There is no hepatomegaly, splenomegaly or mass.     Tenderness: There is no abdominal tenderness.  Musculoskeletal:     Right lower leg: No edema.      Left lower leg: No edema.  Lymphadenopathy:     Cervical: No cervical adenopathy.     Right cervical: No superficial, deep or posterior cervical adenopathy.    Left cervical: No superficial, deep or posterior cervical adenopathy.     Upper Body:     Right upper body: No supraclavicular or axillary adenopathy.     Left upper body: No supraclavicular or axillary adenopathy.  Neurological:     General: No focal deficit present.     Mental Status: He is alert and oriented to person, place, and time.  Psychiatric:        Mood and Affect: Mood normal.        Behavior: Behavior normal.     LABS:      Latest Ref Rng & Units 08/05/2023   12:13 PM 06/10/2023   12:24 PM 03/18/2023   12:02 PM  CBC  WBC 4.0 - 10.5 K/uL 4.4  4.6  5.9   Hemoglobin 13.0 - 17.0 g/dL 16.1  09.6  04.5   Hematocrit 39.0 - 52.0 % 39.4  40.4  43.7   Platelets 150 - 400 K/uL 143  134  145       Latest Ref Rng & Units 08/05/2023   12:13 PM 06/10/2023   12:24 PM 03/18/2023   12:02 PM  CMP  Glucose 70 - 99 mg/dL 409  811  914   BUN 6 - 20 mg/dL 13  18  17    Creatinine 0.61 - 1.24 mg/dL 7.82  9.56  2.13   Sodium 135 - 145 mmol/L 136  136  137   Potassium 3.5 - 5.1 mmol/L 4.1  4.4  4.2   Chloride 98 - 111 mmol/L 102  101  104   CO2 22 - 32 mmol/L 25  27  25    Calcium 8.9 -  10.3 mg/dL 9.0  8.9  9.2   Total Protein 6.5 - 8.1 g/dL 8.0  8.1  8.2   Total Bilirubin 0.0 - 1.2 mg/dL 1.1  1.3  1.2   Alkaline Phos 38 - 126 U/L 92  88  106   AST 15 - 41 U/L 30  30  30    ALT 0 - 44 U/L 18  20  21       Lab Results  Component Value Date   CEA1 1.6 11/13/2019   /  CEA  Date Value Ref Range Status  11/13/2019 1.6 0.0 - 4.7 ng/mL Final    Comment:    (NOTE)                             Nonsmokers          <3.9                             Smokers             <5.6 Roche [employer] Electrochemiluminescence Immunoassay (ECLIA) Values obtained with different assay methods or kits cannot be used interchangeably.   Results cannot be interpreted as absolute evidence of the presence or absence of malignant disease. Performed At: [facility] [address] [place], [place] [id number] Pearlean Botts MD WU:[id number]    No results found for: "PSA1" No results found for: "CAN199" No results found for: "CAN125"  No results found for: "TOTALPROTELP", "ALBUMINELP", "A1GS", "A2GS", "BETS", "BETA2SER", "GAMS", "MSPIKE", "SPEI" Lab Results  Component Value Date   TIBC 242 (L) 07/28/2019   FERRITIN 166 07/28/2019   IRONPCTSAT 41 (H) 07/28/2019   Lab Results  Component Value Date   LDH 147 11/13/2019   LDH 188 08/02/2019     STUDIES:   No results found.

## 2023-08-05 NOTE — Patient Instructions (Signed)
 [facility] [place] - A DEPT OF [facility]  Discharge Instructions: Thank you for choosing [facility] to provide your oncology and hematology care.  If you have a lab appointment with the Cancer Center - please note that after April 8th, 2024, all labs will be drawn in the cancer center.  You do not have to check in or register with the main entrance as you have in the past but will complete your check-in in the cancer center.  Wear comfortable clothing and clothing appropriate for easy access to any Portacath or PICC line.   We strive to give you quality time with your provider. You may need to reschedule your appointment if you arrive late (15 or more minutes).  Arriving late affects you and other patients whose appointments are after yours.  Also, if you miss three or more appointments without notifying the office, you may be dismissed from the clinic at the provider's discretion.      For prescription refill requests, have your pharmacy contact our office and allow 72 hours for refills to be completed.    Today you received Lanroetide injection     BELOW ARE SYMPTOMS THAT SHOULD BE REPORTED IMMEDIATELY: *FEVER GREATER THAN 100.4 F (38 C) OR HIGHER *CHILLS OR SWEATING *NAUSEA AND VOMITING THAT IS NOT CONTROLLED WITH YOUR NAUSEA MEDICATION *UNUSUAL SHORTNESS OF BREATH *UNUSUAL BRUISING OR BLEEDING *URINARY PROBLEMS (pain or burning when urinating, or frequent urination) *BOWEL PROBLEMS (unusual diarrhea, constipation, pain near the anus) TENDERNESS IN MOUTH AND THROAT WITH OR WITHOUT PRESENCE OF ULCERS (sore throat, sores in mouth, or a toothache) UNUSUAL RASH, SWELLING OR PAIN  UNUSUAL VAGINAL DISCHARGE OR ITCHING   Items with * indicate a potential emergency and should be followed up as soon as possible or go to the Emergency Department if any problems should occur.  Please show the CHEMOTHERAPY ALERT CARD or IMMUNOTHERAPY ALERT CARD at  check-in to the Emergency Department and triage nurse.  Should you have questions after your visit or need to cancel or reschedule your appointment, please contact [facility] [place] - A DEPT OF [facility] [phone]  and follow the prompts.  Office hours are 8:00 a.m. to 4:30 p.m. Monday - Friday. Please note that voicemails left after 4:00 p.m. may not be returned until the following business day.  We are closed weekends and major holidays. You have access to a nurse at all times for urgent questions. Please call the main number to the clinic [phone] and follow the prompts.  For any non-urgent questions, you may also contact your provider using MyChart. We now offer e-Visits for anyone 45 and older to request care online for non-urgent symptoms. For details visit [email or web address].   Also download the MyChart app! Go to the app store, search "MyChart", open the app, select [place], and log in with your MyChart username and password.

## 2023-08-06 LAB — CHROMOGRANIN A: Chromogranin A (ng/mL): 216.8 ng/mL — ABNORMAL HIGH (ref 0.0–101.8)

## 2023-09-01 ENCOUNTER — Other Ambulatory Visit: Payer: Self-pay

## 2023-09-01 DIAGNOSIS — C7A00 Malignant carcinoid tumor of unspecified site: Secondary | ICD-10-CM

## 2023-09-02 ENCOUNTER — Inpatient Hospital Stay: Attending: Hematology

## 2023-09-02 ENCOUNTER — Inpatient Hospital Stay
Admission: RE | Admit: 2023-09-02 | Discharge: 2023-09-02 | Disposition: A | Discharge status: Home / Self Care | Payer: Self-pay | Source: Ambulatory Visit | Attending: Hematology | Admitting: Hematology

## 2023-09-02 ENCOUNTER — Inpatient Hospital Stay (HOSPITAL_BASED_OUTPATIENT_CLINIC_OR_DEPARTMENT_OTHER): Admitting: Hematology

## 2023-09-02 ENCOUNTER — Other Ambulatory Visit: Payer: Self-pay | Admitting: Hematology

## 2023-09-02 ENCOUNTER — Inpatient Hospital Stay

## 2023-09-02 VITALS — BP 119/69 | HR 63 | Temp 98.4°F | Resp 18 | Wt 301.0 lb

## 2023-09-02 DIAGNOSIS — R188 Other ascites: Secondary | ICD-10-CM | POA: Insufficient documentation

## 2023-09-02 DIAGNOSIS — C7A00 Malignant carcinoid tumor of unspecified site: Secondary | ICD-10-CM

## 2023-09-02 DIAGNOSIS — C7A098 Malignant carcinoid tumors of other sites: Secondary | ICD-10-CM | POA: Insufficient documentation

## 2023-09-02 DIAGNOSIS — K7460 Unspecified cirrhosis of liver: Secondary | ICD-10-CM | POA: Insufficient documentation

## 2023-09-02 DIAGNOSIS — J90 Pleural effusion, not elsewhere classified: Secondary | ICD-10-CM | POA: Diagnosis not present

## 2023-09-02 DIAGNOSIS — K769 Liver disease, unspecified: Secondary | ICD-10-CM | POA: Diagnosis not present

## 2023-09-02 LAB — CBC WITH DIFFERENTIAL/PLATELET
Abs Immature Granulocytes: 0.01 10*3/uL (ref 0.00–0.07)
Basophils Absolute: 0 10*3/uL (ref 0.0–0.1)
Basophils Relative: 1 %
Eosinophils Absolute: 0.4 10*3/uL (ref 0.0–0.5)
Eosinophils Relative: 7 %
HCT: 41.2 % (ref 39.0–52.0)
Hemoglobin: 13.7 g/dL (ref 13.0–17.0)
Immature Granulocytes: 0 %
Lymphocytes Relative: 22 %
Lymphs Abs: 1.3 10*3/uL (ref 0.7–4.0)
MCH: 32.5 pg (ref 26.0–34.0)
MCHC: 33.3 g/dL (ref 30.0–36.0)
MCV: 97.6 fL (ref 80.0–100.0)
Monocytes Absolute: 0.5 10*3/uL (ref 0.1–1.0)
Monocytes Relative: 8 %
Neutro Abs: 3.6 10*3/uL (ref 1.7–7.7)
Neutrophils Relative %: 62 %
Platelets: 193 10*3/uL (ref 150–400)
RBC: 4.22 MIL/uL (ref 4.22–5.81)
RDW: 13.3 % (ref 11.5–15.5)
WBC: 5.8 10*3/uL (ref 4.0–10.5)
nRBC: 0 % (ref 0.0–0.2)

## 2023-09-02 LAB — COMPREHENSIVE METABOLIC PANEL WITH GFR
ALT: 21 U/L (ref 0–44)
AST: 35 U/L (ref 15–41)
Albumin: 2.7 g/dL — ABNORMAL LOW (ref 3.5–5.0)
Alkaline Phosphatase: 125 U/L (ref 38–126)
Anion gap: 6 (ref 5–15)
BUN: 15 mg/dL (ref 6–20)
CO2: 25 mmol/L (ref 22–32)
Calcium: 8.8 mg/dL — ABNORMAL LOW (ref 8.9–10.3)
Chloride: 102 mmol/L (ref 98–111)
Creatinine, Ser: 1.04 mg/dL (ref 0.61–1.24)
GFR, Estimated: >60 mL/min (ref >60)
Glucose, Bld: 102 mg/dL — ABNORMAL HIGH (ref 70–99)
Potassium: 4 mmol/L (ref 3.5–5.1)
Sodium: 133 mmol/L — ABNORMAL LOW (ref 135–145)
Total Bilirubin: 1 mg/dL (ref 0.0–1.2)
Total Protein: 8.4 g/dL — ABNORMAL HIGH (ref 6.5–8.1)

## 2023-09-02 MED ORDER — LANREOTIDE ACETATE 120 MG/0.5ML ~~LOC~~ SOLN
120.0000 mg | Freq: Once | SUBCUTANEOUS | Status: AC
  Administered 2023-09-02: 120 mg via SUBCUTANEOUS

## 2023-09-02 NOTE — Progress Notes (Signed)
 Lanreotide injection  given per orders. Patient tolerated it well without problems. Vitals stable and discharged home from clinic ambulatory. Follow up as scheduled.

## 2023-09-02 NOTE — Progress Notes (Signed)
 Order for MR Liver faxed to [facility] [phone].  Ph - [phone]

## 2023-09-02 NOTE — Patient Instructions (Signed)
 [facility] at [facility] Discharge Instructions   You were seen and examined today by Dr. Cheree Cords.  He reviewed the results of your lab work which are normal/stable.   He reviewed the results of your CT scan which is stable. There is no evidence that the tumor has grown or spread.   Dr. Linnell Richardson recommends you see a GI doctor due to your having cirrhosis of the liver. This makes you more prone to developing liver cancer. The GI doctor will monitor this by doing ultrasounds or MRIs periodically.   We will proceed with your injection today.   Return as scheduled.    Thank you for choosing [facility] at [facility] to provide your oncology and hematology care.  To afford each patient quality time with our provider, please arrive at least 15 minutes before your scheduled appointment time.   If you have a lab appointment with the Cancer Center please come in thru the Main Entrance and check in at the main information desk.  You need to re-schedule your appointment should you arrive 10 or more minutes late.  We strive to give you quality time with our providers, and arriving late affects you and other patients whose appointments are after yours.  Also, if you no show three or more times for appointments you may be dismissed from the clinic at the providers discretion.     Again, thank you for choosing [facility].  Our hope is that these requests will decrease the amount of time that you wait before being seen by our physicians.       _____________________________________________________________  Should you have questions after your visit to [facility], please contact our office at [phone] and follow the prompts.  Our office hours are 8:00 a.m. and 4:30 p.m. Monday - Friday.  Please note that voicemails left after 4:00 p.m. may not be returned until the following business day.  We are closed weekends and major holidays.   You do have access to a nurse 24-7, just call the main number to the clinic [phone] and do not press any options, hold on the line and a nurse will answer the phone.    For prescription refill requests, have your pharmacy contact our office and allow 72 hours.    Due to Covid, you will need to wear a mask upon entering the hospital. If you do not have a mask, a mask will be given to you at the Main Entrance upon arrival. For doctor visits, patients may have 1 support person age 58 or older with them. For treatment visits, patients can not have anyone with them due to social distancing guidelines and our immunocompromised population.

## 2023-09-02 NOTE — Progress Notes (Signed)
 [facility] [address], [place] [address]    Clinic Day:  09/02/2023  Referring physician: Shirlee Dotter, MD  Patient Care Team: Shirlee Dotter, MD as PCP - General Rourk, Windsor Hatcher, MD as Consulting Physician (Gastroenterology)   ASSESSMENT & PLAN:   Assessment: 1.   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  Possible mild nodular infiltration in the left upper quadrant mesentery and anterior omentum.  Multiple small mesenteric root nodes. -PET scan on 09/05/2019 showed 4.2 cm right anterior mesenteric mass with punctate calcifications, SUV 7.2.  Moderate ascites present.  No definite retroperitoneal adenopathy.  Liver is cirrhotic with spleen enlarged.  Diffusely elevated hepatic activity which is nonspecific. -CT-guided biopsy of the mesenteric mass on 10/09/2019 at [facility] consistent with well-differentiated neuroendocrine tumor (carcinoid tumor).  Tumor cells are strongly positive for chromogranin and synaptophysin.  Negative for CK7, CK20, TTF-1, PSA, NKX3.1, S100, HMB-45, and CD56. -Gallium dotatate PET CT scan on 11/08/2019 at [facility] shows large solid mesenteric midline abdomen mass with increased activity measuring 6.5 cm, SUV of 70.  In the uncinate process there is an area of increased activity visualized since seen on series 603 image 91 measuring 13.7 SUV.  Increased activity throughout the liver and spleen limiting evaluation for metastatic disease.  Hepatic activity is heterogeneous. -Lanreotide started on 11/13/2019. - CT CAP (07/11/2021): 4.6 cm soft tissue mass along the mesenteric root.  No significant abdominal adenopathy.  Small volume ascites.  Cirrhosis. - CT CAP (04/14/2022): Stable 4.6 cm soft tissue mass along the mesenteric root.  Cirrhosis.   Moderate volume ascites.  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   2.  Cirrhosis and ascites: -Thought to be secondary to hepatitis C. -Last paracentesis on 08/21/2019 with 4.8 L removed.    Plan: 1.  Well differentiated neuroendocrine tumor of the mesenteric mass: - CTAP (04/05/2023): 1.6 cm region of hyperenhancement along the hepatic dome which is new.  Stable 4.9 cm soft tissue mass along the mesenteric root.  Stable soft tissue stranding throughout the omentum reflective of third spacing of fluid.  Stable moderate left pleural effusion. - He is tolerating monthly lanreotide very well.  Reflux symptoms are controlled well with Protonix . - I reviewed CT scan from 08/18/2023: Stable hepatic dome lesion as well as mesenteric root lesion measuring about 4.9 cm. - Continue lanreotide monthly. - Will obtain MRI of the liver with and without contrast to follow-up on the liver lesion in the setting of cirrhosis.  RTC 8 weeks for follow-up.    2.  Cirrhosis and ascites: - He status post hep C treatments in the past.  New hepatic dome lesion on the CT scan from 04/05/2023, stable on the current scan.  Will obtain MRI of the liver with and without contrast.    3.  Fluid retention: - Continue Lasix  40 mg daily and spironolactone  100 mg twice daily.    Orders Placed This Encounter  Procedures   MR LIVER W WO CONTRAST    Standing Status:   Future    Expiration Date:   09/01/2024    If indicated for the ordered procedure, I authorize the administration of contrast media per Radiology protocol:   Yes    What is the patient's sedation requirement?:  No Sedation    Does the patient have a pacemaker or implanted devices?:   No    Preferred imaging location?:   External      I,Helena R Teague,acting as a scribe for Paulett Boros, MD.,have documented all relevant documentation on the behalf of Paulett Boros, MD,as  directed by  Paulett Boros, MD while in the presence of Paulett Boros, MD.  I, Paulett Boros MD, have reviewed the above documentation for accuracy and completeness, and I agree with the above.      Paulett Boros, MD   5/22/20253:13 PM  CHIEF COMPLAINT:   Diagnosis: neuroendocrine tumor    Cancer Staging  No matching staging information was found for the patient.    Prior Therapy: none  Current Therapy:  Lanreotide monthly    HISTORY OF PRESENT ILLNESS:   Oncology History   No history exists.     INTERVAL HISTORY:   Nicolas [name] is a 58 y.o. male presenting to clinic today for follow up of neuroendocrine tumor. He was last seen by me on 08/05/23.  Today, he states that he is doing well overall. His appetite level is at 75%. His energy level is at 75%.   PAST MEDICAL HISTORY:   Past Medical History: Past Medical History:  Diagnosis Date   Cirrhosis (HCC)    Hepatitis C    Ab + 07/28/19. RNA 436,000   HTN (hypertension)     Surgical History: Past Surgical History:  Procedure Laterality Date   debridement  Right    per pt, debridement of rt hand    Social History: Social History   Socioeconomic History   Marital status: Divorced    Spouse name: Not on file   Number of children: 4   Years of education: Not on file   Highest education level: Not on file  Occupational History   Occupation: unemployed  Tobacco Use   Smoking status: Never   Smokeless tobacco: Current    Types: Snuff   Tobacco comments:    started using snuff since 1986  Vaping Use   Vaping status: Never Used  Substance and Sexual Activity   Alcohol use: Not Currently    Comment: occasional   Drug use: Never   Sexual activity: Not Currently  Other Topics Concern   Not on file  Social History Narrative   Not on file   Social Drivers of Health   Financial Resource Strain: Low Risk  (08/02/2019)   Overall Financial Resource Strain (CARDIA)    Difficulty of  Paying Living Expenses: Not very hard  Food Insecurity: No Food Insecurity (08/02/2019)   Hunger Vital Sign    Worried About Running Out of Food in the Last Year: Never true    Ran Out of Food in the Last Year: Never true  Transportation Needs: Unmet Transportation Needs (08/02/2019)   PRAPARE - Transportation    Lack of Transportation (Medical): Yes    Lack of Transportation (Non-Medical): Yes  Physical Activity: Inactive (08/02/2019)   Exercise Vital Sign    Days of Exercise per Week: 0 days    Minutes of Exercise per Session: 0 min  Stress: Stress Concern Present (08/02/2019)   [employer] of Occupational Health - Occupational Stress Questionnaire    Feeling of Stress : To some extent  Social Connections: Socially Isolated (08/02/2019)   Social Connection and Isolation Panel [NHANES]    Frequency of Communication with Friends and Family: Three times a week    Frequency of Social Gatherings with  Friends and Family: Three times a week    Attends Religious Services: Never    Active Member of Clubs or Organizations: No    Attends Banker Meetings: Never    Marital Status: Divorced  Catering manager Violence: Not At Risk (08/02/2019)   Humiliation, Afraid, Rape, and Kick questionnaire    Fear of Current or Ex-Partner: No    Emotionally Abused: No    Physically Abused: No    Sexually Abused: No    Family History: Family History  Problem Relation Age of Onset   Cirrhosis Sister        per patient, due to drug use.    Alzheimer's disease Mother    Cancer Mother    Diabetes Mother    Congestive Heart Failure Father    Diabetes Father    Alzheimer's disease Paternal Grandmother    Osteoporosis Paternal Grandmother    Diabetes Daughter    Colon cancer Neg Hx     Current Medications:  Current Outpatient Medications:    clotrimazole  (LOTRIMIN ) 1 % cream, Apply 1 application topically 2 (two) times daily., Disp: 30 g, Rfl: 3   clotrimazole -betamethasone   (LOTRISONE ) cream, Apply 1 Application topically 2 (two) times daily., Disp: 30 g, Rfl: 0   diphenoxylate -atropine  (LOMOTIL ) 2.5-0.025 MG tablet, Take 2 tablets after first watery stool, then 1 tablet after each watery stool.  No more than 8 tablets daily., Disp: 60 tablet, Rfl: 3   Ensure (ENSURE), Take 237 mLs by mouth every evening., Disp: , Rfl:    furosemide  (LASIX ) 40 MG tablet, Take 1 tablet (40 mg total) by mouth daily., Disp: 30 tablet, Rfl: 5   Hepatitis A-Hep B Recomb Vac (TWINRIX) 720-20 ELU-MCG/ML injection, Inject into the muscle. Pt has last shot in July, Disp: , Rfl:    pantoprazole  (PROTONIX ) 40 MG tablet, Take 1 tablet (40 mg total) by mouth daily., Disp: 30 tablet, Rfl: 3   propranolol (INDERAL) 20 MG tablet, Take 1 tablet by mouth daily., Disp: , Rfl:    sertraline  (ZOLOFT ) 50 MG tablet, Take 1 tablet (50 mg total) by mouth daily., Disp: 60 tablet, Rfl: 0   sildenafil (VIAGRA) 50 MG tablet, Take by mouth., Disp: , Rfl:    spironolactone  (ALDACTONE ) 100 MG tablet, Take one tablet in AM, Disp: 60 tablet, Rfl: 2 No current facility-administered medications for this visit.  Facility-Administered Medications Ordered in Other Visits:    epoetin  alfa-epbx (RETACRIT ) 20000 UNIT/ML injection, , , ,    epoetin  alfa-epbx (RETACRIT ) 40000 UNIT/ML injection, , , ,    lanreotide acetate  (SOMATULINE DEPOT ) 120 MG/0.5ML injection, , , ,    Allergies: No Known Allergies  REVIEW OF SYSTEMS:   Review of Systems  Constitutional:  Negative for chills, fatigue and fever.  HENT:   Negative for lump/mass, mouth sores, nosebleeds, sore throat and trouble swallowing.   Eyes:  Negative for eye problems.  Respiratory:  Negative for cough and shortness of breath.   Cardiovascular:  Negative for chest pain, leg swelling and palpitations.  Gastrointestinal:  Negative for abdominal pain, constipation, diarrhea, nausea and vomiting.  Genitourinary:  Negative for bladder incontinence, difficulty  urinating, dysuria, frequency, hematuria and nocturia.   Musculoskeletal:  Negative for arthralgias, back pain, flank pain, myalgias and neck pain.  Skin:  Negative for itching and rash.  Neurological:  Negative for dizziness, headaches and numbness.  Hematological:  Does not bruise/bleed easily.  Psychiatric/Behavioral:  Negative for depression, sleep disturbance and suicidal ideas. The patient is  not nervous/anxious.   All other systems reviewed and are negative.    VITALS:   Blood pressure 119/69, pulse 63, temperature 98.4 F (36.9 C), temperature source Oral, resp. rate 18, weight (!) 301 lb (136.5 kg), SpO2 100%.  Wt Readings from Last 3 Encounters:  09/02/23 (!) 301 lb (136.5 kg)  08/05/23 (!) 312 lb 13.3 oz (141.9 kg)  06/10/23 (!) 307 lb 8.7 oz (139.5 kg)    Body mass index is 37.82 kg/m.  Performance status (ECOG): 1 - Symptomatic but completely ambulatory  PHYSICAL EXAM:   Physical Exam Vitals and nursing note reviewed. Exam conducted with a chaperone present.  Constitutional:      Appearance: Normal appearance.  Cardiovascular:     Rate and Rhythm: Normal rate and regular rhythm.     Pulses: Normal pulses.     Heart sounds: Normal heart sounds.  Pulmonary:     Effort: Pulmonary effort is normal.     Breath sounds: Normal breath sounds.  Abdominal:     Palpations: Abdomen is soft. There is no hepatomegaly, splenomegaly or mass.     Tenderness: There is no abdominal tenderness.  Musculoskeletal:     Right lower leg: No edema.     Left lower leg: No edema.  Lymphadenopathy:     Cervical: No cervical adenopathy.     Right cervical: No superficial, deep or posterior cervical adenopathy.    Left cervical: No superficial, deep or posterior cervical adenopathy.     Upper Body:     Right upper body: No supraclavicular or axillary adenopathy.     Left upper body: No supraclavicular or axillary adenopathy.  Neurological:     General: No focal deficit present.      Mental Status: He is alert and oriented to person, place, and time.  Psychiatric:        Mood and Affect: Mood normal.        Behavior: Behavior normal.     LABS:      Latest Ref Rng & Units 09/02/2023   11:28 AM 08/05/2023   12:13 PM 06/10/2023   12:24 PM  CBC  WBC 4.0 - 10.5 K/uL 5.8  4.4  4.6   Hemoglobin 13.0 - 17.0 g/dL 32.2  02.5  42.7   Hematocrit 39.0 - 52.0 % 41.2  39.4  40.4   Platelets 150 - 400 K/uL 193  143  134       Latest Ref Rng & Units 09/02/2023   11:28 AM 08/05/2023   12:13 PM 06/10/2023   12:24 PM  CMP  Glucose 70 - 99 mg/dL 062  376  283   BUN 6 - 20 mg/dL 15  13  18    Creatinine 0.61 - 1.24 mg/dL 1.51  7.61  6.07   Sodium 135 - 145 mmol/L 133  136  136   Potassium 3.5 - 5.1 mmol/L 4.0  4.1  4.4   Chloride 98 - 111 mmol/L 102  102  101   CO2 22 - 32 mmol/L 25  25  27    Calcium 8.9 - 10.3 mg/dL 8.8  9.0  8.9   Total Protein 6.5 - 8.1 g/dL 8.4  8.0  8.1   Total Bilirubin 0.0 - 1.2 mg/dL 1.0  1.1  1.3   Alkaline Phos 38 - 126 U/L 125  92  88   AST 15 - 41 U/L 35  30  30   ALT 0 - 44 U/L 21  18  20  Lab Results  Component Value Date   CEA1 1.6 11/13/2019   /  CEA  Date Value Ref Range Status  11/13/2019 1.6 0.0 - 4.7 ng/mL Final    Comment:    (NOTE)                             Nonsmokers          <3.9                             Smokers             <5.6 [employer] Electrochemiluminescence Immunoassay (ECLIA) Values obtained with different assay methods or kits cannot be used interchangeably.  Results cannot be interpreted as absolute evidence of the presence or absence of malignant disease. Performed At: [facility] [address] [place], [place] [id number] Pearlean Botts MD WU:[id number]    No results found for: "PSA1" No results found for: [phone]" No results found for: "CAN125"  No results found for: "TOTALPROTELP", "ALBUMINELP", "A1GS", "A2GS", "BETS", "BETA2SER", "GAMS", "MSPIKE", "SPEI" Lab Results  Component  Value Date   TIBC 242 (L) 07/28/2019   FERRITIN 166 07/28/2019   IRONPCTSAT 41 (H) 07/28/2019   Lab Results  Component Value Date   LDH 147 11/13/2019   LDH 188 08/02/2019     STUDIES:   CT OUTSIDE FILMS BODY/ABD/PELVIS Result Date: 09/02/2023 This examination belongs to an outside facility and is stored here for comparison purposes only.  Contact the originating outside institution for any associated report or interpretation.

## 2023-09-04 LAB — CHROMOGRANIN A: Chromogranin A (ng/mL): 273.4 ng/mL — ABNORMAL HIGH (ref 0.0–101.8)

## 2023-09-28 ENCOUNTER — Ambulatory Visit: Payer: Self-pay

## 2023-09-30 ENCOUNTER — Inpatient Hospital Stay: Attending: Hematology

## 2023-09-30 VITALS — BP 116/82 | HR 68 | Temp 98.1°F | Resp 18

## 2023-09-30 DIAGNOSIS — C7A098 Malignant carcinoid tumors of other sites: Secondary | ICD-10-CM | POA: Diagnosis present

## 2023-09-30 DIAGNOSIS — C7A00 Malignant carcinoid tumor of unspecified site: Secondary | ICD-10-CM

## 2023-09-30 MED ORDER — LANREOTIDE ACETATE 120 MG/0.5ML ~~LOC~~ SOLN
120.0000 mg | Freq: Once | SUBCUTANEOUS | Status: AC
  Administered 2023-09-30: 120 mg via SUBCUTANEOUS
  Filled 2023-09-30: qty 120

## 2023-09-30 NOTE — Patient Instructions (Signed)
 [facility] [place] - A DEPT OF [facility]  Discharge Instructions: Thank you for choosing [facility] to provide your oncology and hematology care.  If you have a lab appointment with the Cancer Center - please note that after April 8th, 2024, all labs will be drawn in the cancer center.  You do not have to check in or register with the main entrance as you have in the past but will complete your check-in in the cancer center.  Wear comfortable clothing and clothing appropriate for easy access to any Portacath or PICC line.   We strive to give you quality time with your provider. You may need to reschedule your appointment if you arrive late (15 or more minutes).  Arriving late affects you and other patients whose appointments are after yours.  Also, if you miss three or more appointments without notifying the office, you may be dismissed from the clinic at the provider's discretion.      For prescription refill requests, have your pharmacy contact our office and allow 72 hours for refills to be completed.    Today you received Lanroetide injection     BELOW ARE SYMPTOMS THAT SHOULD BE REPORTED IMMEDIATELY: *FEVER GREATER THAN 100.4 F (38 C) OR HIGHER *CHILLS OR SWEATING *NAUSEA AND VOMITING THAT IS NOT CONTROLLED WITH YOUR NAUSEA MEDICATION *UNUSUAL SHORTNESS OF BREATH *UNUSUAL BRUISING OR BLEEDING *URINARY PROBLEMS (pain or burning when urinating, or frequent urination) *BOWEL PROBLEMS (unusual diarrhea, constipation, pain near the anus) TENDERNESS IN MOUTH AND THROAT WITH OR WITHOUT PRESENCE OF ULCERS (sore throat, sores in mouth, or a toothache) UNUSUAL RASH, SWELLING OR PAIN  UNUSUAL VAGINAL DISCHARGE OR ITCHING   Items with * indicate a potential emergency and should be followed up as soon as possible or go to the Emergency Department if any problems should occur.  Please show the CHEMOTHERAPY ALERT CARD or IMMUNOTHERAPY ALERT CARD at  check-in to the Emergency Department and triage nurse.  Should you have questions after your visit or need to cancel or reschedule your appointment, please contact [facility] [place] - A DEPT OF [facility] [phone]  and follow the prompts.  Office hours are 8:00 a.m. to 4:30 p.m. Monday - Friday. Please note that voicemails left after 4:00 p.m. may not be returned until the following business day.  We are closed weekends and major holidays. You have access to a nurse at all times for urgent questions. Please call the main number to the clinic [phone] and follow the prompts.  For any non-urgent questions, you may also contact your provider using MyChart. We now offer e-Visits for anyone 45 and older to request care online for non-urgent symptoms. For details visit [email or web address].   Also download the MyChart app! Go to the app store, search "MyChart", open the app, select [place], and log in with your MyChart username and password.

## 2023-09-30 NOTE — Progress Notes (Signed)
 Nicolas [name] presents today for injection per the provider's orders.  Lanroetide administration without incident; injection site WNL; see MAR for injection details.  Patient tolerated procedure well and without incident.  No questions or complaints noted at this time.  Discharged from clinic ambulatory in stable condition. Alert and oriented x 3. F/U with [facility] as scheduled.

## 2023-10-21 NOTE — Progress Notes (Unsigned)
 Referring Provider: Margorie Marland MATSU, MD Primary Care Physician:  Margorie Marland MATSU, MD Primary GI Physician: Dr. Shaaron  Chief Complaint  Patient presents with   New Patient (Initial Visit)    Pt referred for hepatic cirrhosis.    HPI:   Nicolas [name] is a 58 y.o. 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  Oncology has ordered MRI with liver protocol to follow-up on this.  Patient is presenting today to reestablish care for cirrhosis.   Prior workup for cirrhosis in 2021 included Hepatitis C antibody positive with RNA 436,000. Lab for genotype not yet completed. Hep A/B negative.  No immunity to hep A/B.  AFP elevated at 14.2. ANA, ASMA, AMA within normal limits.  IgG and IgA elevated.  Ceruloplasmin elevated at 32.1 but follow-up 24 hour urine copper within normal limits.  Iron panel with slightly elevated percent saturation at 41%.   Patient has not been seen in our clinic since May 2021 due to insurance being out of network.  He established with [facility], but appears he has not been seen by [facility] since 2022.  While being seen at [facility], he was treated for hepatitis C with Epclusa x 24 weeks and achieved SVR.   Today:  Has MRI Liver scheduled in [place] on 11/10/23.  Last AFP: Elevated at 14.2 in April 2021.  Ascites/peripheral edema:  No swelling in his lower extremities or abdomen.  Diuretics: Lasix  40 mg and spironolactone  100 mg as needed if he notices some fluid. Last taken about 1 week ago. Limits sodium.  Paracentesis: Last in May 2021 with 4.8 L removed.  History of SBP: None.  Encephalopathy:  None.   MELD 3.0: No recent INR for MELD calculation.  Hep A/B vaccination: Patient reports he completed  vaccine series a couple years ago.  EGD:  Never. Will think about this.  BB: Propranolol 20 mg daily.   History of heartburn. Does well with pantoprazole  40 mg daily as needed. Once every 2 weeks. Intermittent issues with belching if eating after 4 pm. No dysphagia. No brbpr, melena. 2 weeks before getting injection, is constipated. As soon as he gets shot, has diarrhea for 2 weeks. Will use Lomotil  as needed. For constipation, he will eat a bowl of rasin bran and bowel will move. Doesn't want to take any other medications right now.   ETOH: Last was 4-5 years ago, right before he came to see me. Used to drink heavily in his 45s. After that only drank on occasion.   TCS: Never. Doesn't want to ever have colonoscopy and isn't interested in cologuard either.     Past Medical History:  Diagnosis Date   Cirrhosis (HCC)    Hepatitis C    Ab + 07/28/19. RNA 436,000; treated with Epclusa x 24 weeks achieving SVR   HTN (hypertension)    Neuroendocrine tumor     Past Surgical History:  Procedure Laterality Date   debridement  Right    per pt, debridement of rt hand    Current Outpatient Medications  Medication Sig Dispense Refill   clotrimazole  (LOTRIMIN ) 1 % cream Apply 1 application topically 2 (two) times daily. 30 g 3   clotrimazole -betamethasone  (LOTRISONE ) cream Apply 1 Application topically 2 (two) times daily. 30 g  0   diphenoxylate -atropine  (LOMOTIL ) 2.5-0.025 MG tablet Take 2 tablets after first watery stool, then 1 tablet after each watery stool.  No more than 8 tablets daily. 60 tablet 3   Ensure (ENSURE) Take 237 mLs by mouth every evening.     furosemide  (LASIX ) 40 MG tablet Take 1 tablet (40 mg total) by mouth daily. (Patient taking differently: Take 40 mg by mouth daily. prn) 30 tablet 5   pantoprazole  (PROTONIX ) 40 MG tablet Take 1 tablet (40 mg total) by mouth daily. (Patient taking differently: Take 40 mg by mouth daily. As needed.) 30 tablet 3   propranolol (INDERAL) 20  MG tablet Take 1 tablet by mouth daily.     sildenafil (VIAGRA) 50 MG tablet Take by mouth.     spironolactone  (ALDACTONE ) 100 MG tablet Take one tablet in AM (Patient taking differently: Take 100 mg by mouth daily. prn) 60 tablet 2   Hepatitis A-Hep B Recomb Vac (TWINRIX) 720-20 ELU-MCG/ML injection Inject into the muscle. Pt has last shot in July (Patient not taking: Reported on 10/22/2023)     sertraline  (ZOLOFT ) 50 MG tablet Take 1 tablet (50 mg total) by mouth daily. (Patient not taking: Reported on 10/24/2023) 60 tablet 0   No current facility-administered medications for this visit.   Facility-Administered Medications Ordered in Other Visits  Medication Dose Route Frequency Provider Last Rate Last Admin   epoetin  alfa-epbx (RETACRIT ) 20000 UNIT/ML injection            epoetin  alfa-epbx (RETACRIT ) 40000 UNIT/ML injection            lanreotide acetate  (SOMATULINE DEPOT ) 120 MG/0.5ML injection             Allergies as of 10/22/2023   (No Known Allergies)    Family History  Problem Relation Age of Onset   Cirrhosis Sister        per patient, due to drug use.    Alzheimer's disease Mother    Cancer Mother    Diabetes Mother    Congestive Heart Failure Father    Diabetes Father    Alzheimer's disease Paternal Grandmother    Osteoporosis Paternal Grandmother    Diabetes Daughter    Colon cancer Neg Hx     Social History   Socioeconomic History   Marital status: Divorced    Spouse name: Not on file   Number of children: 4   Years of education: Not on file   Highest education level: Not on file  Occupational History   Occupation: unemployed  Tobacco Use   Smoking status: Never   Smokeless tobacco: Current    Types: Snuff   Tobacco comments:    started using snuff since 1986  Vaping Use   Vaping status: Never Used  Substance and Sexual Activity   Alcohol use: Not Currently    Comment: occasional   Drug use: Never   Sexual activity: Not Currently  Other Topics  Concern   Not on file  Social History Narrative   Not on file   Social Drivers of Health   Financial Resource Strain: Low Risk  (08/02/2019)   Overall Financial Resource Strain (CARDIA)    Difficulty of Paying Living Expenses: Not very hard  Food Insecurity: No Food Insecurity (08/02/2019)   Hunger Vital Sign    Worried About Running Out of Food in the Last Year: Never true    Ran Out of Food in the Last Year: Never true  Transportation Needs: Unmet Transportation Needs (08/02/2019)  PRAPARE - Administrator, Civil Service (Medical): Yes    Lack of Transportation (Non-Medical): Yes  Physical Activity: Inactive (08/02/2019)   Exercise Vital Sign    Days of Exercise per Week: 0 days    Minutes of Exercise per Session: 0 min  Stress: Stress Concern Present (08/02/2019)   [employer] of Occupational Health - Occupational Stress Questionnaire    Feeling of Stress : To some extent  Social Connections: Socially Isolated (08/02/2019)   Social Connection and Isolation Panel    Frequency of Communication with Friends and Family: Three times a week    Frequency of Social Gatherings with Friends and Family: Three times a week    Attends Religious Services: Never    Active Member of Clubs or Organizations: No    Attends Banker Meetings: Never    Marital Status: Divorced    Review of Systems: Gen: Denies fever, chills, cold or flu like symptoms, pre-syncope, or syncope.  CV: Denies chest pain, palpitations. Resp: Denies dyspnea, cough. GI: See HPI Heme: See HPI  Physical Exam: BP 107/72   Pulse 71   Temp 98.4 F (36.9 C)   Ht 6' 5 (1.956 m)   Wt (!) 307 lb 12.8 oz (139.6 kg)   BMI 36.50 kg/m  General:   Alert and oriented. No distress noted. Pleasant and cooperative.  Head:  Normocephalic and atraumatic. Eyes:  Conjuctiva clear without scleral icterus. Heart:  S1, S2 present without murmurs appreciated. Lungs:  Clear to auscultation bilaterally. No  wheezes, rales, or rhonchi. No distress.  Abdomen:  +BS, soft, non-tender and non-distended. No rebound or guarding. No HSM or masses noted. Msk:  Symmetrical without gross deformities. Normal posture. Extremities:  Without edema. Neurologic:  Alert and  oriented x4 Psych:  Normal mood and affect.    Assessment:  58 y.o. 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  Cirrhosis: Likely secondary to chronic hep C s/p treatment with Epclusa x 24 weeks achieving SVR.  He did drink alcohol heavily in his 68s, but only on occasion after that and no alcohol since he first saw me in 2021.   Previously with LE edema and ascites requiring paracentesis, but last para in 2021 and doing very well on Lasix  40 mg and spironolactone  100 mg as needed.  No significant LE edema or ascites on exam today.  He is very careful with dietary sodium intake.  No HE.   Reports completing hep A/B vaccine series a couple years ago.  He has never had an EGD and states he will think about this.  I had a long discussion with him today about the importance of screening for esophageal varices considering he was decompensated previously and has had thrombocytopenia though platelets recently normal at 193 in May.  He will notify me when he is ready to proceed.   No recent INR for MELD calculation. Will update labs now.   Liver lesion: CT A/P with contrast in December 2024 with 1.6 cm hyperenhancing lesion in the liver, stable on CT in May 2025. Last AFP was elevated at 14.2 in April 2021. This is concerning in the setting of cirrhosis. [facility] oncologist has already ordered MRI Liver which  patient reports is scheduled  for 11/10/2023 in [place].  GERD:  Doing well. Uses pantoprazole  prn.   Colon cancer screening: No prior colonoscopy and declining ever having a colonoscopy.  Also declined Cologuard.   Plan:  AFP, INR, CMP Follow-up on MRI Liver to be completed in [place] Continue Lasix  and Spironolactone  prn  Continue pantoprazole  40 mg daily prn.  Patient will let me know if he would like to schedule EGD.  Nutrition:  High-protein diet from a primarily plant-based diet. Avoid red meat.  No raw or undercooked meat, seafood, or shellfish. Low-fat/cholesterol/carbohydrate diet. Limit sodium to no more than 2000 mg/day including everything that you eat and drink. Recommend at least 30 minutes of aerobic and resistance exercise 3 days/week. Follow-up in 6 months or sooner if needed.    Josette Centers, PA-C [facility] 10/22/2023

## 2023-10-22 ENCOUNTER — Ambulatory Visit (INDEPENDENT_AMBULATORY_CARE_PROVIDER_SITE_OTHER): Admitting: Gastroenterology

## 2023-10-22 VITALS — BP 107/72 | HR 71 | Temp 98.4°F | Ht 77.0 in | Wt 307.8 lb

## 2023-10-22 DIAGNOSIS — K219 Gastro-esophageal reflux disease without esophagitis: Secondary | ICD-10-CM | POA: Diagnosis not present

## 2023-10-22 DIAGNOSIS — K7460 Unspecified cirrhosis of liver: Secondary | ICD-10-CM | POA: Diagnosis not present

## 2023-10-22 DIAGNOSIS — K769 Liver disease, unspecified: Secondary | ICD-10-CM

## 2023-10-22 DIAGNOSIS — Z8619 Personal history of other infectious and parasitic diseases: Secondary | ICD-10-CM

## 2023-10-22 DIAGNOSIS — K7469 Other cirrhosis of liver: Secondary | ICD-10-CM

## 2023-10-22 DIAGNOSIS — F1091 Alcohol use, unspecified, in remission: Secondary | ICD-10-CM | POA: Diagnosis not present

## 2023-10-22 DIAGNOSIS — Z1211 Encounter for screening for malignant neoplasm of colon: Secondary | ICD-10-CM

## 2023-10-22 NOTE — Patient Instructions (Addendum)
 Keep upcoming appointment with [facility] for MRI of your liver.   Please have labs completed that I have ordered at your upcoming appointment with Dr. Rogers  Nutrition:  High-protein diet from a primarily plant-based diet. Avoid red meat.  No raw or undercooked meat, seafood, or shellfish. Low-fat/cholesterol/carbohydrate diet. Limit sodium to no more than 2000 mg/day including everything that you eat and drink. Recommend at least 30 minutes of aerobic and resistance exercise 3 days/week.  Continue Lasix  and spironolactone  as needed if you notice fluid retention.  Continue pantoprazole  40 mg daily as needed.  Let me know if you would like to scheduled an upper endoscopy.   We will plan to see back in 6 months or sooner if needed.  Josette Centers, PA-C [facility]

## 2023-10-24 ENCOUNTER — Encounter: Payer: Self-pay | Admitting: Gastroenterology

## 2023-10-27 ENCOUNTER — Other Ambulatory Visit: Payer: Self-pay

## 2023-10-27 DIAGNOSIS — K769 Liver disease, unspecified: Secondary | ICD-10-CM

## 2023-10-27 DIAGNOSIS — C7A00 Malignant carcinoid tumor of unspecified site: Secondary | ICD-10-CM

## 2023-10-28 ENCOUNTER — Inpatient Hospital Stay: Attending: Hematology

## 2023-10-28 ENCOUNTER — Inpatient Hospital Stay

## 2023-10-28 ENCOUNTER — Inpatient Hospital Stay (HOSPITAL_BASED_OUTPATIENT_CLINIC_OR_DEPARTMENT_OTHER): Admitting: Hematology

## 2023-10-28 VITALS — BP 104/73 | HR 62 | Temp 97.9°F | Resp 16 | Wt 313.1 lb

## 2023-10-28 DIAGNOSIS — C7A00 Malignant carcinoid tumor of unspecified site: Secondary | ICD-10-CM | POA: Diagnosis not present

## 2023-10-28 DIAGNOSIS — R161 Splenomegaly, not elsewhere classified: Secondary | ICD-10-CM | POA: Insufficient documentation

## 2023-10-28 DIAGNOSIS — C7A098 Malignant carcinoid tumors of other sites: Secondary | ICD-10-CM | POA: Diagnosis present

## 2023-10-28 DIAGNOSIS — K7460 Unspecified cirrhosis of liver: Secondary | ICD-10-CM | POA: Insufficient documentation

## 2023-10-28 DIAGNOSIS — R188 Other ascites: Secondary | ICD-10-CM | POA: Diagnosis not present

## 2023-10-28 LAB — CBC WITH DIFFERENTIAL/PLATELET
Abs Immature Granulocytes: 0.01 K/uL (ref 0.00–0.07)
Basophils Absolute: 0.1 K/uL (ref 0.0–0.1)
Basophils Relative: 1 %
Eosinophils Absolute: 0.4 K/uL (ref 0.0–0.5)
Eosinophils Relative: 9 %
HCT: 41.2 % (ref 39.0–52.0)
Hemoglobin: 13.2 g/dL (ref 13.0–17.0)
Immature Granulocytes: 0 %
Lymphocytes Relative: 30 %
Lymphs Abs: 1.4 K/uL (ref 0.7–4.0)
MCH: 31.6 pg (ref 26.0–34.0)
MCHC: 32 g/dL (ref 30.0–36.0)
MCV: 98.6 fL (ref 80.0–100.0)
Monocytes Absolute: 0.5 K/uL (ref 0.1–1.0)
Monocytes Relative: 10 %
Neutro Abs: 2.4 K/uL (ref 1.7–7.7)
Neutrophils Relative %: 50 %
Platelets: 132 K/uL — ABNORMAL LOW (ref 150–400)
RBC: 4.18 MIL/uL — ABNORMAL LOW (ref 4.22–5.81)
RDW: 13.9 % (ref 11.5–15.5)
WBC: 4.7 K/uL (ref 4.0–10.5)
nRBC: 0 % (ref 0.0–0.2)

## 2023-10-28 LAB — COMPREHENSIVE METABOLIC PANEL WITH GFR
ALT: 22 U/L (ref 0–44)
AST: 32 U/L (ref 15–41)
Albumin: 2.7 g/dL — ABNORMAL LOW (ref 3.5–5.0)
Alkaline Phosphatase: 130 U/L — ABNORMAL HIGH (ref 38–126)
Anion gap: 10 (ref 5–15)
BUN: 16 mg/dL (ref 6–20)
CO2: 23 mmol/L (ref 22–32)
Calcium: 8.7 mg/dL — ABNORMAL LOW (ref 8.9–10.3)
Chloride: 105 mmol/L (ref 98–111)
Creatinine, Ser: 1.02 mg/dL (ref 0.61–1.24)
GFR, Estimated: >60 mL/min (ref >60)
Glucose, Bld: 137 mg/dL — ABNORMAL HIGH (ref 70–99)
Potassium: 4.2 mmol/L (ref 3.5–5.1)
Sodium: 138 mmol/L (ref 135–145)
Total Bilirubin: 1.2 mg/dL (ref 0.0–1.2)
Total Protein: 8 g/dL (ref 6.5–8.1)

## 2023-10-28 MED ORDER — LANREOTIDE ACETATE 120 MG/0.5ML ~~LOC~~ SOLN
120.0000 mg | Freq: Once | SUBCUTANEOUS | Status: AC
  Administered 2023-10-28: 120 mg via SUBCUTANEOUS
  Filled 2023-10-28: qty 120

## 2023-10-28 NOTE — Patient Instructions (Addendum)
 [facility] at [facility] Discharge Instructions   You were seen and examined today by Dr. Cheree Cords.  He reviewed the results of your lab work which are normal/stable.   We will see you back in 4 months. We will repeat lab work and a CT scan prior to this visit.   Return as scheduled.    Thank you for choosing [facility] at [facility] to provide your oncology and hematology care.  To afford each patient quality time with our provider, please arrive at least 15 minutes before your scheduled appointment time.   If you have a lab appointment with the Cancer Center please come in thru the Main Entrance and check in at the main information desk.  You need to re-schedule your appointment should you arrive 10 or more minutes late.  We strive to give you quality time with our providers, and arriving late affects you and other patients whose appointments are after yours.  Also, if you no show three or more times for appointments you may be dismissed from the clinic at the providers discretion.     Again, thank you for choosing [facility].  Our hope is that these requests will decrease the amount of time that you wait before being seen by our physicians.       _____________________________________________________________  Should you have questions after your visit to [facility], please contact our office at [phone] and follow the prompts.  Our office hours are 8:00 a.m. and 4:30 p.m. Monday - Friday.  Please note that voicemails left after 4:00 p.m. may not be returned until the following business day.  We are closed weekends and major holidays.  You do have access to a nurse 24-7, just call the main number to the clinic [phone] and do not press any options, hold on the line and a nurse will answer the phone.    For prescription refill requests, have your pharmacy contact our office and allow 72 hours.    Due to  Covid, you will need to wear a mask upon entering the hospital. If you do not have a mask, a mask will be given to you at the Main Entrance upon arrival. For doctor visits, patients may have 1 support person age 87 or older with them. For treatment visits, patients can not have anyone with them due to social distancing guidelines and our immunocompromised population.

## 2023-10-28 NOTE — Patient Instructions (Signed)
 [facility] [place] - A DEPT OF [facility]  Discharge Instructions: Thank you for choosing [facility] to provide your oncology and hematology care.  If you have a lab appointment with the Cancer Center - please note that after April 8th, 2024, all labs will be drawn in the cancer center.  You do not have to check in or register with the main entrance as you have in the past but will complete your check-in in the cancer center.  Wear comfortable clothing and clothing appropriate for easy access to any Portacath or PICC line.   We strive to give you quality time with your provider. You may need to reschedule your appointment if you arrive late (15 or more minutes).  Arriving late affects you and other patients whose appointments are after yours.  Also, if you miss three or more appointments without notifying the office, you may be dismissed from the clinic at the provider's discretion.      For prescription refill requests, have your pharmacy contact our office and allow 72 hours for refills to be completed.    Today you received Lanreotide injection.     BELOW ARE SYMPTOMS THAT SHOULD BE REPORTED IMMEDIATELY: *FEVER GREATER THAN 100.4 F (38 C) OR HIGHER *CHILLS OR SWEATING *NAUSEA AND VOMITING THAT IS NOT CONTROLLED WITH YOUR NAUSEA MEDICATION *UNUSUAL SHORTNESS OF BREATH *UNUSUAL BRUISING OR BLEEDING *URINARY PROBLEMS (pain or burning when urinating, or frequent urination) *BOWEL PROBLEMS (unusual diarrhea, constipation, pain near the anus) TENDERNESS IN MOUTH AND THROAT WITH OR WITHOUT PRESENCE OF ULCERS (sore throat, sores in mouth, or a toothache) UNUSUAL RASH, SWELLING OR PAIN  UNUSUAL VAGINAL DISCHARGE OR ITCHING   Items with * indicate a potential emergency and should be followed up as soon as possible or go to the Emergency Department if any problems should occur.  Please show the CHEMOTHERAPY ALERT CARD or IMMUNOTHERAPY ALERT CARD at  check-in to the Emergency Department and triage nurse.  Should you have questions after your visit or need to cancel or reschedule your appointment, please contact [facility] [place] - A DEPT OF Eligha Bridegroom [facility] [phone]  and follow the prompts.  Office hours are 8:00 a.m. to 4:30 p.m. Monday - Friday. Please note that voicemails left after 4:00 p.m. may not be returned until the following business day.  We are closed weekends and major holidays. You have access to a nurse at all times for urgent questions. Please call the main number to the clinic [phone] and follow the prompts.  For any non-urgent questions, you may also contact your provider using MyChart. We now offer e-Visits for anyone 73 and older to request care online for non-urgent symptoms. For details visit [email or web address].   Also download the MyChart app! Go to the app store, search "MyChart", open the app, select [place], and log in with your MyChart username and password.

## 2023-10-28 NOTE — Progress Notes (Signed)
 Nicolas [name] presents today for injection per the provider's orders.  Lanreotide administration without incident; injection site WNL; see MAR for injection details.  Patient tolerated procedure well and without incident.  No questions or complaints noted at this time.  Discharged from clinic ambulatory in stable condition. Alert and oriented x 3. F/U with [facility] as scheduled.

## 2023-10-28 NOTE — Progress Notes (Signed)
 [facility] [address] [place] [address]    Clinic Day:  10/28/2023  Referring physician: Margorie Marland MATSU, MD  Patient Care Team: Margorie Marland MATSU, MD as PCP - General Rourk, Lamar HERO, MD as Consulting Physician (Gastroenterology)   ASSESSMENT & PLAN:   Assessment: 1.   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  Possible mild nodular infiltration in the left upper quadrant mesentery and anterior omentum.  Multiple small mesenteric root nodes. -PET scan on 09/05/2019 showed 4.2 cm right anterior mesenteric mass with punctate calcifications, SUV 7.2.  Moderate ascites present.  No definite retroperitoneal adenopathy.  Liver is cirrhotic with spleen enlarged.  Diffusely elevated hepatic activity which is nonspecific. -CT-guided biopsy of the mesenteric mass on 10/09/2019 at [facility] consistent with well-differentiated neuroendocrine tumor (carcinoid tumor).  Tumor cells are strongly positive for chromogranin and synaptophysin.  Negative for CK7, CK20, TTF-1, PSA, NKX3.1, S100, HMB-45, and CD56. -Gallium dotatate PET CT scan on 11/08/2019 at [facility] shows large solid mesenteric midline abdomen mass with increased activity measuring 6.5 cm, SUV of 70.  In the uncinate process there is an area of increased activity visualized since seen on series 603 image 91 measuring 13.7 SUV.  Increased activity throughout the liver and spleen limiting evaluation for metastatic disease.  Hepatic activity is heterogeneous. -Lanreotide started on 11/13/2019. - CT CAP (07/11/2021): 4.6 cm soft tissue mass along the mesenteric root.  No significant abdominal adenopathy.  Small volume ascites.  Cirrhosis. - CT CAP (04/14/2022): Stable 4.6 cm soft tissue mass along the mesenteric root.  Cirrhosis.   Moderate volume ascites.  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   2.  Cirrhosis and ascites: -Thought to be secondary to hepatitis C. -Last paracentesis on 08/21/2019 with 4.8 L removed.    Plan: 1.  Well differentiated neuroendocrine tumor of the mesenteric mass: - CTAP (04/05/2023): 1.6 cm region of hyperenhancement along the hepatic dome which is new.  Stable 4.9 cm soft tissue mass along the mesenteric root.  Stable soft tissue stranding throughout the omentum reflective of third spacing of fluid.  Stable moderate left pleural effusion. - CTAP (08/18/2023): Stable hepatic dome lesion as well as mesenteric root lesion measuring 4.9 cm. - Nicolas [name] is tolerating lanreotide very well.  Acid reflux symptoms resolved after Protonix  started. - Reviewed labs from today: Normal LFTs with albumin 2.7.  ALK lites and creatinine are normal.  CBC grossly normal.  Chromogranin was 273, slightly increased from last time at 216. - Recommend continuing lanreotide monthly. - Recommend follow-up in 4 months with repeat CTAP with contrast and labs.    2.  Cirrhosis and ascites: - Nicolas [name] was previously treated with hep C with Epclusa. - In December 2020 for new hepatic dome liver lesion was seen which was stable in May. - Nicolas [name] is having MRI of the liver with and without contrast on 11/10/2023 at [facility] imaging center.  Nicolas [name] will bring the disc/report on 11/15/2023 for me to review. - Nicolas [name] is already established with GI/hepatology locally.    3.  Fluid retention: - Continue Lasix  40 mg daily and spironolactone  100 mg twice daily.  Swelling is well-controlled.    Orders Placed This Encounter  Procedures   CT ABDOMEN PELVIS W CONTRAST    Standing  Status:   Future    Expected Date:   01/28/2024    Expiration Date:   10/27/2024    If indicated for the ordered procedure, I authorize the administration of contrast media per Radiology protocol:   Yes     Does the patient have a contrast media/X-ray dye allergy?:   No    Preferred imaging location?:   External    If indicated for the ordered procedure, I authorize the administration of oral contrast media per Radiology protocol:   Yes   CBC with Differential    Standing Status:   Future    Expected Date:   02/21/2024    Expiration Date:   05/21/2024   Comprehensive metabolic panel    Standing Status:   Future    Expected Date:   02/21/2024    Expiration Date:   05/21/2024   Chromogranin A    Standing Status:   Future    Expected Date:   02/21/2024    Expiration Date:   05/21/2024      LILLETTE Hummingbird R Teague,acting as a scribe for Alean Stands, MD.,have documented all relevant documentation on the behalf of Alean Stands, MD,as directed by  Alean Stands, MD while in the presence of Alean Stands, MD.  I, Alean Stands MD, have reviewed the above documentation for accuracy and completeness, and I agree with the above.      Alean Stands, MD   7/17/202512:40 PM  CHIEF COMPLAINT:   Diagnosis: neuroendocrine tumor    Cancer Staging  No matching staging information was found for the patient.    Prior Therapy: none  Current Therapy:  Lanreotide monthly    HISTORY OF PRESENT ILLNESS:   Oncology History   No history exists.     INTERVAL HISTORY:   Nicolas [name] is a 58 y.o. male presenting to clinic today for follow up of neuroendocrine tumor. Nicolas [name] was last seen by me on 09/02/2023.  Today, Nicolas [name] states that Nicolas [name] is doing well overall. His appetite level is at 75%. His energy level is at 75%. Nicolas [name] has MRI of the liver scheduled for 11/10/2023 or 11/11/2023. Nicolas [name] has been seen by GI on 10/22/2023 and has 6 month follow-up. Nicolas [name] denies any side effects from lanreotide injections. Nicolas [name] is taking spironolactone  and lasix  as prescribed. Reflux symptoms are well controlled with Protonix .   PAST MEDICAL HISTORY:   Past Medical History: Past Medical History:  Diagnosis  Date   Cirrhosis (HCC)    Hepatitis C    Ab + 07/28/19. RNA 436,000; treated with Epclusa x 24 weeks achieving SVR   HTN (hypertension)    Neuroendocrine tumor     Surgical History: Past Surgical History:  Procedure Laterality Date   debridement  Right    per pt, debridement of rt hand    Social History: Social History   Socioeconomic History   Marital status: Divorced    Spouse name: Not on file   Number of children: 4   Years of education: Not on file   Highest education level: Not on file  Occupational History   Occupation: unemployed  Tobacco Use   Smoking status: Never   Smokeless tobacco: Current    Types: Snuff   Tobacco comments:    started using snuff since 1986  Vaping Use   Vaping status: Never Used  Substance and Sexual Activity   Alcohol use: Not Currently    Comment: occasional   Drug use: Never   Sexual activity: Not Currently  Other Topics  Concern   Not on file  Social History Narrative   Not on file   Social Drivers of Health   Financial Resource Strain: Low Risk  (08/02/2019)   Overall Financial Resource Strain (CARDIA)    Difficulty of Paying Living Expenses: Not very hard  Food Insecurity: No Food Insecurity (08/02/2019)   Hunger Vital Sign    Worried About Running Out of Food in the Last Year: Never true    Ran Out of Food in the Last Year: Never true  Transportation Needs: Unmet Transportation Needs (08/02/2019)   PRAPARE - Transportation    Lack of Transportation (Medical): Yes    Lack of Transportation (Non-Medical): Yes  Physical Activity: Inactive (08/02/2019)   Exercise Vital Sign    Days of Exercise per Week: 0 days    Minutes of Exercise per Session: 0 min  Stress: Stress Concern Present (08/02/2019)   [employer] of Occupational Health - Occupational Stress Questionnaire    Feeling of Stress : To some extent  Social Connections: Socially Isolated (08/02/2019)   Social Connection and Isolation Panel    Frequency of  Communication with Friends and Family: Three times a week    Frequency of Social Gatherings with Friends and Family: Three times a week    Attends Religious Services: Never    Active Member of Clubs or Organizations: No    Attends Banker Meetings: Never    Marital Status: Divorced  Catering manager Violence: Not At Risk (08/02/2019)   Humiliation, Afraid, Rape, and Kick questionnaire    Fear of Current or Ex-Partner: No    Emotionally Abused: No    Physically Abused: No    Sexually Abused: No    Family History: Family History  Problem Relation Age of Onset   Cirrhosis Sister        per patient, due to drug use.    Alzheimer's disease Mother    Cancer Mother    Diabetes Mother    Congestive Heart Failure Father    Diabetes Father    Alzheimer's disease Paternal Grandmother    Osteoporosis Paternal Grandmother    Diabetes Daughter    Colon cancer Neg Hx     Current Medications:  Current Outpatient Medications:    clotrimazole  (LOTRIMIN ) 1 % cream, Apply 1 application topically 2 (two) times daily., Disp: 30 g, Rfl: 3   clotrimazole -betamethasone  (LOTRISONE ) cream, Apply 1 Application topically 2 (two) times daily., Disp: 30 g, Rfl: 0   diphenoxylate -atropine  (LOMOTIL ) 2.5-0.025 MG tablet, Take 2 tablets after first watery stool, then 1 tablet after each watery stool.  No more than 8 tablets daily., Disp: 60 tablet, Rfl: 3   Ensure (ENSURE), Take 237 mLs by mouth every evening., Disp: , Rfl:    furosemide  (LASIX ) 40 MG tablet, Take 1 tablet (40 mg total) by mouth daily. (Patient taking differently: Take 40 mg by mouth daily. prn), Disp: 30 tablet, Rfl: 5   pantoprazole  (PROTONIX ) 40 MG tablet, Take 1 tablet (40 mg total) by mouth daily. (Patient taking differently: Take 40 mg by mouth daily. As needed.), Disp: 30 tablet, Rfl: 3   propranolol (INDERAL) 20 MG tablet, Take 1 tablet by mouth daily., Disp: , Rfl:    sildenafil (VIAGRA) 50 MG tablet, Take by mouth., Disp:  , Rfl:    spironolactone  (ALDACTONE ) 100 MG tablet, Take one tablet in AM (Patient taking differently: Take 100 mg by mouth daily. prn), Disp: 60 tablet, Rfl: 2 No current facility-administered medications for this visit.  Facility-Administered Medications Ordered in Other Visits:    epoetin  alfa-epbx (RETACRIT ) 20000 UNIT/ML injection, , , ,    epoetin  alfa-epbx (RETACRIT ) 40000 UNIT/ML injection, , , ,    lanreotide acetate  (SOMATULINE DEPOT ) 120 MG/0.5ML injection, , , ,    Allergies: No Known Allergies  REVIEW OF SYSTEMS:   Review of Systems  Constitutional:  Negative for chills, fatigue and fever.  HENT:   Negative for lump/mass, mouth sores, nosebleeds, sore throat and trouble swallowing.   Eyes:  Negative for eye problems.  Respiratory:  Negative for cough and shortness of breath.   Cardiovascular:  Negative for chest pain, leg swelling and palpitations.  Gastrointestinal:  Positive for constipation. Negative for abdominal pain, diarrhea, nausea and vomiting.  Genitourinary:  Negative for bladder incontinence, difficulty urinating, dysuria, frequency, hematuria and nocturia.   Musculoskeletal:  Negative for arthralgias, back pain, flank pain, myalgias and neck pain.  Skin:  Negative for itching and rash.  Neurological:  Negative for dizziness, headaches and numbness.  Hematological:  Does not bruise/bleed easily.  Psychiatric/Behavioral:  Negative for depression, sleep disturbance and suicidal ideas. The patient is not nervous/anxious.   All other systems reviewed and are negative.    VITALS:   Blood pressure 104/73, pulse 62, temperature 97.9 F (36.6 C), temperature source Oral, resp. rate 16, weight (!) 313 lb 0.9 oz (142 kg), SpO2 99%.  Wt Readings from Last 3 Encounters:  10/28/23 (!) 313 lb 0.9 oz (142 kg)  10/22/23 (!) 307 lb 12.8 oz (139.6 kg)  09/02/23 (!) 301 lb (136.5 kg)    Body mass index is 37.12 kg/m.  Performance status (ECOG): 1 - Symptomatic but  completely ambulatory  PHYSICAL EXAM:   Physical Exam Vitals and nursing note reviewed. Exam conducted with a chaperone present.  Constitutional:      Appearance: Normal appearance.  Cardiovascular:     Rate and Rhythm: Normal rate and regular rhythm.     Pulses: Normal pulses.     Heart sounds: Normal heart sounds.  Pulmonary:     Effort: Pulmonary effort is normal.     Breath sounds: Normal breath sounds.  Abdominal:     Palpations: Abdomen is soft. There is no hepatomegaly, splenomegaly or mass.     Tenderness: There is no abdominal tenderness.  Musculoskeletal:     Right lower leg: No edema.     Left lower leg: No edema.  Lymphadenopathy:     Cervical: No cervical adenopathy.     Right cervical: No superficial, deep or posterior cervical adenopathy.    Left cervical: No superficial, deep or posterior cervical adenopathy.     Upper Body:     Right upper body: No supraclavicular or axillary adenopathy.     Left upper body: No supraclavicular or axillary adenopathy.  Neurological:     General: No focal deficit present.     Mental Status: Nicolas [name] is alert and oriented to person, place, and time.  Psychiatric:        Mood and Affect: Mood normal.        Behavior: Behavior normal.     LABS:      Latest Ref Rng & Units 10/28/2023   10:19 AM 09/02/2023   11:28 AM 08/05/2023   12:13 PM  CBC  WBC 4.0 - 10.5 K/uL 4.7  5.8  4.4   Hemoglobin 13.0 - 17.0 g/dL 86.7  86.2  86.7   Hematocrit 39.0 - 52.0 % 41.2  41.2  39.4   Platelets  150 - 400 K/uL 132  193  143       Latest Ref Rng & Units 10/28/2023   10:19 AM 09/02/2023   11:28 AM 08/05/2023   12:13 PM  CMP  Glucose 70 - 99 mg/dL 862  897  856   BUN 6 - 20 mg/dL 16  15  13    Creatinine 0.61 - 1.24 mg/dL 8.97  8.95  8.86   Sodium 135 - 145 mmol/L 138  133  136   Potassium 3.5 - 5.1 mmol/L 4.2  4.0  4.1   Chloride 98 - 111 mmol/L 105  102  102   CO2 22 - 32 mmol/L 23  25  25    Calcium 8.9 - 10.3 mg/dL 8.7  8.8  9.0   Total  Protein 6.5 - 8.1 g/dL 8.0  8.4  8.0   Total Bilirubin 0.0 - 1.2 mg/dL 1.2  1.0  1.1   Alkaline Phos 38 - 126 U/L 130  125  92   AST 15 - 41 U/L 32  35  30   ALT 0 - 44 U/L 22  21  18       Lab Results  Component Value Date   CEA1 1.6 11/13/2019   /  CEA  Date Value Ref Range Status  11/13/2019 1.6 0.0 - 4.7 ng/mL Final    Comment:    (NOTE)                             Nonsmokers          <3.9                             Smokers             <5.6 Roche Diagnostics Electrochemiluminescence Immunoassay (ECLIA) Values obtained with different assay methods or kits cannot be used interchangeably.  Results cannot be interpreted as absolute evidence of the presence or absence of malignant disease. Performed At: [facility] [address] [place], [place] [id number] Jennette Shorter MD Ey:[phone]    No results found for: PSA1 No results found for: CAN199 No results found for: CAN125  No results found for: TOTALPROTELP, ALBUMINELP, A1GS, A2GS, BETS, BETA2SER, GAMS, MSPIKE, SPEI Lab Results  Component Value Date   TIBC 242 (L) 07/28/2019   FERRITIN 166 07/28/2019   IRONPCTSAT 41 (H) 07/28/2019   Lab Results  Component Value Date   LDH 147 11/13/2019   LDH 188 08/02/2019     STUDIES:   No results found.

## 2023-10-30 LAB — CHROMOGRANIN A: Chromogranin A (ng/mL): 193.6 ng/mL — ABNORMAL HIGH (ref 0.0–101.8)

## 2023-11-15 ENCOUNTER — Other Ambulatory Visit: Payer: Self-pay | Admitting: Hematology

## 2023-11-15 ENCOUNTER — Inpatient Hospital Stay
Admission: RE | Admit: 2023-11-15 | Discharge: 2023-11-15 | Disposition: A | Discharge status: Home / Self Care | Payer: Self-pay | Source: Ambulatory Visit | Attending: Hematology | Admitting: Hematology

## 2023-11-15 DIAGNOSIS — K769 Liver disease, unspecified: Secondary | ICD-10-CM

## 2023-11-25 ENCOUNTER — Inpatient Hospital Stay: Attending: Hematology

## 2023-11-25 VITALS — BP 115/80 | HR 70 | Temp 97.9°F | Resp 18

## 2023-11-25 DIAGNOSIS — C7A098 Malignant carcinoid tumors of other sites: Secondary | ICD-10-CM | POA: Insufficient documentation

## 2023-11-25 DIAGNOSIS — C7A00 Malignant carcinoid tumor of unspecified site: Secondary | ICD-10-CM

## 2023-11-25 MED ORDER — LANREOTIDE ACETATE 120 MG/0.5ML ~~LOC~~ SOLN
120.0000 mg | Freq: Once | SUBCUTANEOUS | Status: AC
  Administered 2023-11-25: 120 mg via SUBCUTANEOUS
  Filled 2023-11-25: qty 120

## 2023-11-25 NOTE — Patient Instructions (Addendum)
 [facility] [place] - A DEPT OF [facility]  Discharge Instructions: Thank you for choosing [facility] to provide your oncology and hematology care.  If you have a lab appointment with the Cancer Center - please note that after April 8th, 2024, all labs will be drawn in the cancer center.  You do not have to check in or register with the main entrance as you have in the past but will complete your check-in in the cancer center.  Wear comfortable clothing and clothing appropriate for easy access to any Portacath or PICC line.   We strive to give you quality time with your provider. You may need to reschedule your appointment if you arrive late (15 or more minutes).  Arriving late affects you and other patients whose appointments are after yours.  Also, if you miss three or more appointments without notifying the office, you may be dismissed from the clinic at the provider's discretion.      For prescription refill requests, have your pharmacy contact our office and allow 72 hours for refills to be completed.    Today you received the following Lanreotide injection.  Lanreotide Injection What is this medication? LANREOTIDE (lan REE oh tide) treats high levels of growth hormone (acromegaly). It is used when other therapies have not worked well enough or cannot be tolerated. It works by reducing the amount of growth hormone your body makes. This reduces symptoms and the risk of health problems caused by too much growth hormone, such as diabetes and heart disease. It may also be used to treat neuroendocrine tumors, a cancer of the cells that release hormones and other substances in your body. It works by slowing down the release of these substances from the cells. This slows tumor growth. It also decreases the symptoms of carcinoid syndrome, such as flushing or diarrhea. This medicine may be used for other purposes; ask your health care provider or pharmacist if you  have questions. COMMON BRAND NAME(S): Somatuline Depot  What should I tell my care team before I take this medication? They need to know if you have any of these conditions: Diabetes Gallbladder disease Heart disease Kidney disease Liver disease Pancreatic disease Thyroid disease An unusual or allergic reaction to lanreotide, other medications, foods, dyes, or preservatives Pregnant or trying to get pregnant Breastfeeding How should I use this medication? This medication is injected under the skin. It is given by your care team in a hospital or clinic setting. Talk to your care team about the use of this medication in children. Special care may be needed. Overdosage: If you think you have taken too much of this medicine contact a poison control center or emergency room at once. NOTE: This medicine is only for you. Do not share this medicine with others. What if I miss a dose? Keep appointments for follow-up doses. It is important not to miss your dose. Call your care team if you are unable to keep an appointment. What may interact with this medication? Bromocriptine Cyclosporine Certain medications for blood pressure, heart disease, irregular heartbeat Certain medications for diabetes Quinidine Terfenadine This list may not describe all possible interactions. Give your health care provider a list of all the medicines, herbs, non-prescription drugs, or dietary supplements you use. Also tell them if you smoke, drink alcohol, or use illegal drugs. Some items may interact with your medicine. What should I watch for while using this medication? Visit your care team for regular checks  on your progress. Tell your care team if your symptoms do not start to get better or if they get worse. Your condition will be monitored carefully while you are receiving this medication. You may need blood work while you are taking this medication. This medication may increase blood sugar. The risk may be  higher in patients who already have diabetes. Ask your care team what you can do to lower your risk of diabetes while taking this medication. Talk to your care team if you wish to become pregnant or think you may be pregnant. This medication can cause serious birth defects. Do not breast-feed while taking this medication and for 6 months after stopping therapy. This medication may cause infertility. Talk to your care team if you are concerned about your fertility. What side effects may I notice from receiving this medication? Side effects that you should report to your care team as soon as possible: Allergic reactions--skin rash, itching, hives, swelling of the face, lips, tongue, or throat Gallbladder problems--severe stomach pain, nausea, vomiting, fever High blood sugar (hyperglycemia)--increased thirst or amount of urine, unusual weakness or fatigue, blurry vision Increase in blood pressure Low blood sugar (hypoglycemia)--pale, blue or purple skin or lips, sweating, fussiness, rapid heartbeat, poor feeding, low body temperature Low thyroid levels (hypothyroidism)--unusual weakness or fatigue, increased sensitivity to cold, constipation, hair loss, dry skin, weight gain, feelings of depression Oily or light-colored stools, diarrhea, bloating, weight loss Slow heartbeat--dizziness, feeling faint or lightheaded, confusion, trouble breathing, unusual weakness or fatigue Side effects that usually do not require medical attention (report these to your care team if they continue or are bothersome): Diarrhea Dizziness Headache Muscle spasms Nausea Pain, redness, or irritation at injection site Stomach pain This list may not describe all possible side effects. Call your doctor for medical advice about side effects. You may report side effects to FDA at 1-800-FDA-1088. Where should I keep my medication? This medication is given in a hospital or clinic. It will not be stored at home. NOTE: This  sheet is a summary. It may not cover all possible information. If you have questions about this medicine, talk to your doctor, pharmacist, or health care provider.  2024 Elsevier/Gold Standard (2023-03-12 00:00:00)  To help prevent nausea and vomiting after your treatment, we encourage you to take your nausea medication as directed.  BELOW ARE SYMPTOMS THAT SHOULD BE REPORTED IMMEDIATELY: *FEVER GREATER THAN 100.4 F (38 C) OR HIGHER *CHILLS OR SWEATING *NAUSEA AND VOMITING THAT IS NOT CONTROLLED WITH YOUR NAUSEA MEDICATION *UNUSUAL SHORTNESS OF BREATH *UNUSUAL BRUISING OR BLEEDING *URINARY PROBLEMS (pain or burning when urinating, or frequent urination) *BOWEL PROBLEMS (unusual diarrhea, constipation, pain near the anus) TENDERNESS IN MOUTH AND THROAT WITH OR WITHOUT PRESENCE OF ULCERS (sore throat, sores in mouth, or a toothache) UNUSUAL RASH, SWELLING OR PAIN  UNUSUAL VAGINAL DISCHARGE OR ITCHING   Items with * indicate a potential emergency and should be followed up as soon as possible or go to the Emergency Department if any problems should occur.  Please show the CHEMOTHERAPY ALERT CARD or IMMUNOTHERAPY ALERT CARD at check-in to the Emergency Department and triage nurse.  Should you have questions after your visit or need to cancel or reschedule your appointment, please contact [facility] [place] - A DEPT OF [facility] [phone]  and follow the prompts.  Office hours are 8:00 a.m. to 4:30 p.m. Monday - Friday. Please note that voicemails left after 4:00 p.m. may not be returned  until the following business day.  We are closed weekends and major holidays. You have access to a nurse at all times for urgent questions. Please call the main number to the clinic [phone] and follow the prompts.  For any non-urgent questions, you may also contact your provider using MyChart. We now offer e-Visits for anyone 50 and older to request care online for non-urgent  symptoms. For details visit [email or web address].   Also download the MyChart app! Go to the app store, search "MyChart", open the app, select [place], and log in with your MyChart username and password.

## 2023-11-25 NOTE — Progress Notes (Signed)
 Patient presents today for Lanreotide injection. Patient tolerated injection in left gluteal, per request, with no complaints voiced. Small amount of blood noted at site after injection but site now clean and dry with no bruising or swelling noted after.  No complaints of pain.  Discharged with vital signs stable and no signs or symptoms of distress noted.

## 2023-12-17 ENCOUNTER — Telehealth: Payer: Self-pay | Admitting: Gastroenterology

## 2023-12-17 NOTE — Telephone Encounter (Signed)
 Reviewed MRI abdomen with and without contrast completed at [facility] imaging 11/10/2023.  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  No definite washout to confirm HCC.  Recommended attention on follow-up exam.  Reviewed with Dr. 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  Recommended continuing monitoring as this lesion has been stable rather than biopsying.  MRI also showed large amount of stool throughout the visualized colon.  Recommend patient minimize use of Lomotil  and consider starting MiraLAX 17 g daily in 8 ounces of water to allow for good productive bowel movements daily.   Tammy/Dana: Please let patient know that I reviewed his MRI from November 10, 2023.  It appears that the lesion on his liver is stable at 15 mm.  I reviewed his MRI with Dr. Davonna with oncology who recommended that we continue to monitor this with surveillance MRI.  We will put him on recall for MRI in February 2026.  He still needs to complete blood work that I ordered at his office visit.  Appears that he did have a CMP completed on 10/28/2023 with results in [facility], but I do not see AFP or INR.  Mandy:  Please place patient on recall for MRI abdomen liver protocol in February 2025.

## 2023-12-20 ENCOUNTER — Other Ambulatory Visit: Payer: Self-pay | Admitting: *Deleted

## 2023-12-20 NOTE — Addendum Note (Signed)
 Addended by: WELLINGTON MILLING on: 12/20/2023 03:51 PM   Modules accepted: Orders

## 2023-12-20 NOTE — Telephone Encounter (Signed)
 Lmom for pt to return call, sent pt a mychart message requesting return call as well.

## 2023-12-20 NOTE — Telephone Encounter (Signed)
 Pt was made aware and verbalized understanding. Labs were ordered and pt stated that he would have them done the same time he did labs with oncology next week.

## 2023-12-23 ENCOUNTER — Inpatient Hospital Stay: Attending: Hematology

## 2023-12-23 VITALS — BP 132/87 | HR 56 | Temp 97.8°F | Resp 16

## 2023-12-23 DIAGNOSIS — C7A098 Malignant carcinoid tumors of other sites: Secondary | ICD-10-CM | POA: Insufficient documentation

## 2023-12-23 DIAGNOSIS — C7A00 Malignant carcinoid tumor of unspecified site: Secondary | ICD-10-CM

## 2023-12-23 MED ORDER — LANREOTIDE ACETATE 120 MG/0.5ML ~~LOC~~ SOLN
120.0000 mg | Freq: Once | SUBCUTANEOUS | Status: AC
  Administered 2023-12-23: 120 mg via SUBCUTANEOUS
  Filled 2023-12-23: qty 120

## 2023-12-23 NOTE — Patient Instructions (Signed)
 [facility] [place] - [facility]  Discharge Instructions: Thank you for choosing [facility] to provide your oncology and hematology care.  If you have a lab appointment with the Cancer Center - please note that after April 8th, 2024, all labs will be drawn in the cancer center.  You do not have to check in or register with the main entrance as you have in the past but will complete your check-in in the cancer center.  Wear comfortable clothing and clothing appropriate for easy access to any Portacath or PICC line.   We strive to give you quality time with your provider. You may need to reschedule your appointment if you arrive late (15 or more minutes).  Arriving late affects you and other patients whose appointments are after yours.  Also, if you miss three or more appointments without notifying the office, you may be dismissed from the clinic at the provider's discretion.      For prescription refill requests, have your pharmacy contact our office and allow 72 hours for refills to be completed.    Today you received the following chemotherapy and/or immunotherapy agents lanreotide      To help prevent nausea and vomiting after your treatment, we encourage you to take your nausea medication as directed.  BELOW ARE SYMPTOMS THAT SHOULD BE REPORTED IMMEDIATELY: *FEVER GREATER THAN 100.4 F (38 C) OR HIGHER *CHILLS OR SWEATING *NAUSEA AND VOMITING THAT IS NOT CONTROLLED WITH YOUR NAUSEA MEDICATION *UNUSUAL SHORTNESS OF BREATH *UNUSUAL BRUISING OR BLEEDING *URINARY PROBLEMS (pain or burning when urinating, or frequent urination) *BOWEL PROBLEMS (unusual diarrhea, constipation, pain near the anus) TENDERNESS IN MOUTH AND THROAT WITH OR WITHOUT PRESENCE OF ULCERS (sore throat, sores in mouth, or a toothache) UNUSUAL RASH, SWELLING OR PAIN  UNUSUAL VAGINAL DISCHARGE OR ITCHING   Items with * indicate a potential emergency and should be followed  up as soon as possible or go to the Emergency Department if any problems should occur.  Please show the CHEMOTHERAPY ALERT CARD or IMMUNOTHERAPY ALERT CARD at check-in to the Emergency Department and triage nurse.  Should you have questions after your visit or need to cancel or reschedule your appointment, please contact [facility] [place] - A DEPT OF Eligha Bridegroom [facility] [phone]  and follow the prompts.  Office hours are 8:00 a.m. to 4:30 p.m. Monday - Friday. Please note that voicemails left after 4:00 p.m. may not be returned until the following business day.  We are closed weekends and major holidays. You have access to a nurse at all times for urgent questions. Please call the main number to the clinic [phone] and follow the prompts.  For any non-urgent questions, you may also contact your provider using MyChart. We now offer e-Visits for anyone 48 and older to request care online for non-urgent symptoms. For details visit [email or web address].   Also download the MyChart app! Go to the app store, search "MyChart", open the app, select [place], and log in with your MyChart username and password.

## 2023-12-23 NOTE — Progress Notes (Signed)
Nicolas [name] presents today for Lanreotide injection per the provider's orders.  Stable during administration without incident; injection site WNL; see MAR for injection details.  Patient tolerated procedure well and without incident.  No questions or complaints noted at this time.  

## 2024-01-20 ENCOUNTER — Inpatient Hospital Stay: Attending: Hematology

## 2024-01-20 VITALS — BP 126/78 | HR 63 | Temp 98.3°F | Resp 16

## 2024-01-20 DIAGNOSIS — C7A098 Malignant carcinoid tumors of other sites: Secondary | ICD-10-CM | POA: Insufficient documentation

## 2024-01-20 DIAGNOSIS — C7A00 Malignant carcinoid tumor of unspecified site: Secondary | ICD-10-CM

## 2024-01-20 MED ORDER — LANREOTIDE ACETATE 120 MG/0.5ML ~~LOC~~ SOLN
120.0000 mg | Freq: Once | SUBCUTANEOUS | Status: AC
  Administered 2024-01-20: 120 mg via SUBCUTANEOUS
  Filled 2024-01-20: qty 120

## 2024-01-20 NOTE — Patient Instructions (Signed)
 [facility] [place] - [facility]  Discharge Instructions: Thank you for choosing [facility] to provide your oncology and hematology care.  If you have a lab appointment with the Cancer Center - please note that after April 8th, 2024, all labs will be drawn in the cancer center.  You do not have to check in or register with the main entrance as you have in the past but will complete your check-in in the cancer center.  Wear comfortable clothing and clothing appropriate for easy access to any Portacath or PICC line.   We strive to give you quality time with your provider. You may need to reschedule your appointment if you arrive late (15 or more minutes).  Arriving late affects you and other patients whose appointments are after yours.  Also, if you miss three or more appointments without notifying the office, you may be dismissed from the clinic at the provider's discretion.      For prescription refill requests, have your pharmacy contact our office and allow 72 hours for refills to be completed.    Today you received the following Lanreotide, return as scheduled.   To help prevent nausea and vomiting after your treatment, we encourage you to take your nausea medication as directed.  BELOW ARE SYMPTOMS THAT SHOULD BE REPORTED IMMEDIATELY: *FEVER GREATER THAN 100.4 F (38 C) OR HIGHER *CHILLS OR SWEATING *NAUSEA AND VOMITING THAT IS NOT CONTROLLED WITH YOUR NAUSEA MEDICATION *UNUSUAL SHORTNESS OF BREATH *UNUSUAL BRUISING OR BLEEDING *URINARY PROBLEMS (pain or burning when urinating, or frequent urination) *BOWEL PROBLEMS (unusual diarrhea, constipation, pain near the anus) TENDERNESS IN MOUTH AND THROAT WITH OR WITHOUT PRESENCE OF ULCERS (sore throat, sores in mouth, or a toothache) UNUSUAL RASH, SWELLING OR PAIN  UNUSUAL VAGINAL DISCHARGE OR ITCHING   Items with * indicate a potential emergency and should be followed up as soon as possible  or go to the Emergency Department if any problems should occur.  Please show the CHEMOTHERAPY ALERT CARD or IMMUNOTHERAPY ALERT CARD at check-in to the Emergency Department and triage nurse.  Should you have questions after your visit or need to cancel or reschedule your appointment, please contact [facility] [place] - A DEPT OF [facility] [phone]  and follow the prompts.  Office hours are 8:00 a.m. to 4:30 p.m. Monday - Friday. Please note that voicemails left after 4:00 p.m. may not be returned until the following business day.  We are closed weekends and major holidays. You have access to a nurse at all times for urgent questions. Please call the main number to the clinic [phone] and follow the prompts.  For any non-urgent questions, you may also contact your provider using MyChart. We now offer e-Visits for anyone 31 and older to request care online for non-urgent symptoms. For details visit [email or web address].   Also download the MyChart app! Go to the app store, search "MyChart", open the app, select [place], and log in with your MyChart username and password.

## 2024-01-20 NOTE — Progress Notes (Signed)
 Patient tolerated injection with no complaints voiced. Site clean and dry with no bruising or swelling noted at site. See MAR for details. Band aid applied.  Patient stable during and after injection. VSS with discharge and left in satisfactory condition with no s/s of distress noted.

## 2024-02-10 ENCOUNTER — Inpatient Hospital Stay

## 2024-02-10 DIAGNOSIS — C7A00 Malignant carcinoid tumor of unspecified site: Secondary | ICD-10-CM

## 2024-02-10 DIAGNOSIS — C7A098 Malignant carcinoid tumors of other sites: Secondary | ICD-10-CM | POA: Diagnosis not present

## 2024-02-10 LAB — CBC WITH DIFFERENTIAL/PLATELET
Abs Immature Granulocytes: 0.02 K/uL (ref 0.00–0.07)
Basophils Absolute: 0.1 K/uL (ref 0.0–0.1)
Basophils Relative: 1 %
Eosinophils Absolute: 0.4 K/uL (ref 0.0–0.5)
Eosinophils Relative: 8 %
HCT: 42.7 % (ref 39.0–52.0)
Hemoglobin: 14 g/dL (ref 13.0–17.0)
Immature Granulocytes: 0 %
Lymphocytes Relative: 34 %
Lymphs Abs: 1.6 K/uL (ref 0.7–4.0)
MCH: 32.2 pg (ref 26.0–34.0)
MCHC: 32.8 g/dL (ref 30.0–36.0)
MCV: 98.2 fL (ref 80.0–100.0)
Monocytes Absolute: 0.5 K/uL (ref 0.1–1.0)
Monocytes Relative: 11 %
Neutro Abs: 2.1 K/uL (ref 1.7–7.7)
Neutrophils Relative %: 46 %
Platelets: 118 K/uL — ABNORMAL LOW (ref 150–400)
RBC: 4.35 MIL/uL (ref 4.22–5.81)
RDW: 13.3 % (ref 11.5–15.5)
WBC: 4.6 K/uL (ref 4.0–10.5)
nRBC: 0 % (ref 0.0–0.2)

## 2024-02-10 LAB — COMPREHENSIVE METABOLIC PANEL WITH GFR
ALT: 16 U/L (ref 0–44)
AST: 30 U/L (ref 15–41)
Albumin: 3.4 g/dL — ABNORMAL LOW (ref 3.5–5.0)
Alkaline Phosphatase: 115 U/L (ref 38–126)
Anion gap: 6 (ref 5–15)
BUN: 16 mg/dL (ref 6–20)
CO2: 28 mmol/L (ref 22–32)
Calcium: 9.1 mg/dL (ref 8.9–10.3)
Chloride: 104 mmol/L (ref 98–111)
Creatinine, Ser: 1.07 mg/dL (ref 0.61–1.24)
GFR, Estimated: >60 mL/min (ref >60)
Glucose, Bld: 87 mg/dL (ref 70–99)
Potassium: 5.1 mmol/L (ref 3.5–5.1)
Sodium: 137 mmol/L (ref 135–145)
Total Bilirubin: 0.8 mg/dL (ref 0.0–1.2)
Total Protein: 8 g/dL (ref 6.5–8.1)

## 2024-02-12 LAB — CHROMOGRANIN A: Chromogranin A (ng/mL): 217.8 ng/mL — ABNORMAL HIGH (ref 0.0–101.8)

## 2024-02-17 ENCOUNTER — Inpatient Hospital Stay

## 2024-02-17 ENCOUNTER — Inpatient Hospital Stay: Attending: Hematology | Admitting: Oncology

## 2024-02-17 VITALS — BP 121/80 | HR 56 | Temp 97.4°F | Resp 18 | Wt 319.0 lb

## 2024-02-17 DIAGNOSIS — C7A00 Malignant carcinoid tumor of unspecified site: Secondary | ICD-10-CM

## 2024-02-17 DIAGNOSIS — R197 Diarrhea, unspecified: Secondary | ICD-10-CM | POA: Insufficient documentation

## 2024-02-17 DIAGNOSIS — D696 Thrombocytopenia, unspecified: Secondary | ICD-10-CM | POA: Diagnosis not present

## 2024-02-17 DIAGNOSIS — C7A098 Malignant carcinoid tumors of other sites: Secondary | ICD-10-CM | POA: Insufficient documentation

## 2024-02-17 DIAGNOSIS — R188 Other ascites: Secondary | ICD-10-CM | POA: Insufficient documentation

## 2024-02-17 DIAGNOSIS — K7460 Unspecified cirrhosis of liver: Secondary | ICD-10-CM | POA: Diagnosis not present

## 2024-02-17 MED ORDER — LANREOTIDE ACETATE 120 MG/0.5ML ~~LOC~~ SOLN
120.0000 mg | Freq: Once | SUBCUTANEOUS | Status: AC
  Administered 2024-02-17: 120 mg via SUBCUTANEOUS

## 2024-02-17 MED ORDER — PREDNISONE 50 MG PO TABS: ORAL_TABLET | ORAL | 0 refills | Status: DC

## 2024-02-17 NOTE — Patient Instructions (Signed)
 Lanreotide Injection What is this medication? LANREOTIDE (lan REE oh tide) treats high levels of growth hormone (acromegaly). It is used when other therapies have not worked well enough or cannot be tolerated. It works by reducing the amount of growth hormone your body makes. This reduces symptoms and the risk of health problems caused by too much growth hormone, such as diabetes and heart disease. It may also be used to treat neuroendocrine tumors, a cancer of the cells that release hormones and other substances in your body. It works by slowing down the release of these substances from the cells. This slows tumor growth. It also decreases the symptoms of carcinoid syndrome, such as flushing or diarrhea. This medicine may be used for other purposes; ask your health care provider or pharmacist if you have questions. COMMON BRAND NAME(S): Somatuline Depot What should I tell my care team before I take this medication? They need to know if you have any of these conditions: Diabetes Gallbladder disease Heart disease Kidney disease Liver disease Pancreatic disease Thyroid disease An unusual or allergic reaction to lanreotide, other medications, foods, dyes, or preservatives Pregnant or trying to get pregnant Breastfeeding How should I use this medication? This medication is injected under the skin. It is given by your care team in a hospital or clinic setting. Talk to your care team about the use of this medication in children. Special care may be needed. Overdosage: If you think you have taken too much of this medicine contact a poison control center or emergency room at once. NOTE: This medicine is only for you. Do not share this medicine with others. What if I miss a dose? Keep appointments for follow-up doses. It is important not to miss your dose. Call your care team if you are unable to keep an appointment. What may interact with this medication? Bromocriptine Cyclosporine Certain  medications for blood pressure, heart disease, irregular heartbeat Certain medications for diabetes Quinidine Terfenadine This list may not describe all possible interactions. Give your health care provider a list of all the medicines, herbs, non-prescription drugs, or dietary supplements you use. Also tell them if you smoke, drink alcohol, or use illegal drugs. Some items may interact with your medicine. What should I watch for while using this medication? Visit your care team for regular checks on your progress. Tell your care team if your symptoms do not start to get better or if they get worse. Your condition will be monitored carefully while you are receiving this medication. You may need blood work while you are taking this medication. This medication may increase blood sugar. The risk may be higher in patients who already have diabetes. Ask your care team what you can do to lower your risk of diabetes while taking this medication. Talk to your care team if you wish to become pregnant or think you may be pregnant. This medication can cause serious birth defects. Do not breast-feed while taking this medication and for 6 months after stopping therapy. This medication may cause infertility. Talk to your care team if you are concerned about your fertility. What side effects may I notice from receiving this medication? Side effects that you should report to your care team as soon as possible: Allergic reactions--skin rash, itching, hives, swelling of the face, lips, tongue, or throat Gallbladder problems--severe stomach pain, nausea, vomiting, fever High blood sugar (hyperglycemia)--increased thirst or amount of urine, unusual weakness or fatigue, blurry vision Increase in blood pressure Low blood sugar (hypoglycemia)--pale, blue or purple  skin or lips, sweating, fussiness, rapid heartbeat, poor feeding, low body temperature Low thyroid levels (hypothyroidism)--unusual weakness or fatigue,  increased sensitivity to cold, constipation, hair loss, dry skin, weight gain, feelings of depression Oily or light-colored stools, diarrhea, bloating, weight loss Slow heartbeat--dizziness, feeling faint or lightheaded, confusion, trouble breathing, unusual weakness or fatigue Side effects that usually do not require medical attention (report these to your care team if they continue or are bothersome): Diarrhea Dizziness Headache Muscle spasms Nausea Pain, redness, or irritation at injection site Stomach pain This list may not describe all possible side effects. Call your doctor for medical advice about side effects. You may report side effects to FDA at 1-800-FDA-1088. Where should I keep my medication? This medication is given in a hospital or clinic. It will not be stored at home. NOTE: This sheet is a summary. It may not cover all possible information. If you have questions about this medicine, talk to your doctor, pharmacist, or health care provider.  2024 Elsevier/Gold Standard (2023-03-12 00:00:00)

## 2024-02-17 NOTE — Patient Instructions (Addendum)
 [facility] at [facility] Discharge Instructions   You were seen and examined today by Dr. Davonna.  She reviewed the results of your lab work which are normal/stable.   We will proceed with your injection today.   Return as scheduled.    Thank you for choosing [facility] to provide your oncology and hematology care.  To afford each patient quality time with our provider, please arrive at least 15 minutes before your scheduled appointment time.   If you have a lab appointment with the Cancer Center please come in thru the Main Entrance and check in at the main information desk.  You need to re-schedule your appointment should you arrive 10 or more minutes late.  We strive to give you quality time with our providers, and arriving late affects you and other patients whose appointments are after yours.  Also, if you no show three or more times for appointments you may be dismissed from the clinic at the providers discretion.     Again, thank you for choosing [facility].  Our hope is that these requests will decrease the amount of time that you wait before being seen by our physicians.       _____________________________________________________________  Should you have questions after your visit to [facility], please contact our office at [phone] and follow the prompts.  Our office hours are 8:00 a.m. and 4:30 p.m. Monday - Friday.  Please note that voicemails left after 4:00 p.m. may not be returned until the following business day.  We are closed weekends and major holidays.  You do have access to a nurse 24-7, just call the main number to the clinic [phone] and do not press any options, hold on the line and a nurse will answer the phone.    For prescription refill requests, have your pharmacy contact our office and allow 72 hours.    Due to Covid, you will need to wear a mask upon entering the  hospital. If you do not have a mask, a mask will be given to you at the Main Entrance upon arrival. For doctor visits, patients may have 1 support person age 33 or older with them. For treatment visits, patients can not have anyone with them due to social distancing guidelines and our immunocompromised population.

## 2024-02-17 NOTE — Progress Notes (Signed)
 Patient tolerated lanreotide injection with no complaints voiced.  Site clean and dry with no bruising or swelling noted at site.  See MAR for details.  Band aid applied.  Patient stable during and after injection.  Vss with discharge and left in satisfactory condition with no s/s of distress noted. All follow ups as scheduled.   Nicolas [name] [employer]

## 2024-02-17 NOTE — Progress Notes (Signed)
 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 male was transferred to my care after his prior physician has left.   ASSESSMENT & PLAN:   Assessment & Plan: Nicolas [name]  is a 58 y.o. male with well differentiated neuroendocrine tumor  Assessment and Plan Assessment & Plan Well-differentiated neuroendocrine tumor Biopsy-proven via mesenteric mass biopsy at [facility] in 2021 Extensive oncology history below Currently on lanreotide injections monthly  - Last MRI from 10/2023 reviewed.  Hepatic cirrhosis with small ascites present.  Posterior superior right hepatic hyperintense lesion, likely dysplastic nodule. - Continue lanreotide 120 mg injections monthly -Chromogranin A level at 217,  elevated but stable. - Schedule CT CAP in 3 months prior to next visit  Return to clinic in 3 months with scan  Liver cirrhosis with thrombocytopenia and intermittent ascites Platelet count at 118, attributed to liver cirrhosis. Intermittent ascites managed with diuretics  -Will perform nutritional workup for thrombocytopenia at next visit.  Intermittent diarrhea related to injection therapy Diarrhea occurs two weeks post-injection, followed by constipation. Managed with Imodium and Miralax as needed  - Continue Imodium and Miralax as needed.   The patient understands the plans discussed today and is in agreement with them.  He knows to contact our office if he develops concerns prior to his next appointment.  30 minutes of total time was spent for this patient encounter, including preparation,review of records,  face-to-face counseling with the patient and coordination of care, physical exam, and  documentation of the encounter.    LILLETTE Verneta SAUNDERS Teague,acting as a neurosurgeon for Mickiel Dry, MD.,have documented all relevant documentation on the behalf of Mickiel Dry, MD,as directed by  Mickiel Dry, MD while in the presence of Mickiel Dry, MD.  I, Mickiel Dry MD, have reviewed the above documentation for accuracy and completeness, and I agree with the above.    Mickiel Dry, MD  [facility] [place] - A DEPT OF [facility] [address] [place] [address] Dept: [phone] Dept Fax: [phone]   Orders Placed This Encounter  Procedures   CT ABDOMEN PELVIS W CONTRAST    Standing Status:   Future    Expected Date:   05/19/2024    Expiration Date:   02/16/2025    If indicated for the ordered procedure, I authorize the administration of contrast media per Radiology protocol:   Yes    Does the patient have a contrast media/X-ray dye allergy?:   Yes    Preferred imaging location?:   External    If indicated for the ordered procedure, I authorize the administration of oral contrast media per Radiology protocol:   Yes     ONCOLOGY HISTORY:   I have reviewed his chart and materials related to his cancer extensively and collaborated history with the patient. 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 Large volume ascites throughout the abdomen. Possible mild nodular infiltration of the left upper quadrant mesentery and anterior omentum. 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  -09/05/2019: PET: Solid mesenteric  mass right of midline abdomen worrisome for neoplastic process with increase activity. No definite additional metabolically active lesions identified. The liver is cirrhotic with splenomegaly.  Diffusely elevated hepatic activity which is nonspecific.  -10/09/2019: Mesenteric mass biopsy.   Pathology: Well differentiated neuroendocrine tumor (carcinoid tumor). The lesional cells are strongly positive for chromogranin and synaptophysin. -11/08/2019: Gallium Dotatate PET: Large solid mesenteric mass midline abdomen with increased activity redemonstrated. Heterogenous cirrhotic appearing liver, difficult to exclude small metastatic lesion. Findings suspicious for some increased activity in the uncinate process of the pancreas and a small lesion cannot be excluded.  -11/13/2019-current: Lanreotide injections -05/20/2020: Chromogranin A 184.9 -07/22/2020: Chromogranin A 215.5 -2022-03/2023: Stable to improved disease -2022-current: Chromogranin levels have remained relative stable ranging from the mid 100's to the lower 200's -04/05/2023: CT AP: New homogenous focus of hyper enhancement within the hepatic dome. Stable soft tissue mass along the mesenteric root.  -08/18/2023-current: Stable disease, hepatic cirrhosis with small enhancing mass noted on scans -11/10/2023: MRI Abdomen: Posterior superior right hepatic hyperintense lesion, likely dysplastic nodule. No definite washout to confirm HCC.   Current Treatment:  Monthly Lanreotide injections  INTERVAL HISTORY:   Discussed the use of AI scribe software for clinical note transcription with the patient, who gave verbal consent to proceed.  History of Present Illness Nicolas [name] is a 58 year old male with neuroendocrine tumor and liver cirrhosis who presents to establish care with me for well differentiated neuroendocrine tumor.   He has a neuroendocrine tumor. His chromogranin A level is 217. He experiences diarrhea two weeks after receiving his shot, followed by constipation two weeks later. He uses Miralax for constipation and has Imodium available for diarrhea. No flushing symptoms are reported.  He has liver cirrhosis and a  platelet count of 118. He experiences intermittent fluid accumulation in his abdomen, which he manages with diuretics.   He reports knee pain, possibly due to a twist or injury. An MRI was performed in July, but no follow-up actions were taken.    I have reviewed the past medical history, past surgical history, social history and family history with the patient and they are unchanged from previous note.  ALLERGIES:  is allergic to iodinated contrast media.  MEDICATIONS:  Current Outpatient Medications  Medication Sig Dispense Refill   predniSONE (DELTASONE) 50 MG tablet Take 50 mg by mouth 13 hours, 7 hours, and 1 hour prior to CT scan 3 tablet 0   clotrimazole  (LOTRIMIN ) 1 % cream Apply 1 application topically 2 (two) times daily. 30 g 3   clotrimazole -betamethasone  (LOTRISONE ) cream Apply 1 Application topically 2 (two) times daily. 30 g 0   diphenoxylate -atropine  (LOMOTIL ) 2.5-0.025 MG tablet Take 2 tablets after first watery stool, then 1 tablet after each watery stool.  No more than 8 tablets daily. 60 tablet 3   Ensure (ENSURE) Take 237 mLs by mouth every evening.     furosemide  (LASIX ) 40 MG tablet Take 1 tablet (40 mg total) by mouth daily. (Patient taking differently: Take 40 mg by mouth daily. prn) 30 tablet 5   pantoprazole  (PROTONIX ) 40 MG tablet Take 1 tablet (40 mg total) by mouth daily. (Patient taking differently: Take 40 mg by mouth daily. As needed.) 30 tablet 3   propranolol (INDERAL) 20 MG tablet Take 1 tablet by mouth daily.     sildenafil (VIAGRA) 50 MG tablet Take by mouth.     spironolactone  (ALDACTONE ) 100 MG tablet Take one tablet in AM (Patient taking differently: Take 100 mg  by mouth daily. prn) 60 tablet 2   No current facility-administered medications for this visit.   Facility-Administered Medications Ordered in Other Visits  Medication Dose Route Frequency Provider Last Rate Last Admin   epoetin  alfa-epbx (RETACRIT ) 20000 UNIT/ML injection             epoetin  alfa-epbx (RETACRIT ) 40000 UNIT/ML injection            lanreotide acetate  (SOMATULINE DEPOT ) 120 MG/0.5ML injection             REVIEW OF SYSTEMS:   Constitutional: Denies fevers, chills or abnormal weight loss Eyes: Denies blurriness of vision Ears, nose, mouth, throat, and face: Denies mucositis or sore throat Respiratory: Denies cough, dyspnea or wheezes Cardiovascular: Denies palpitation, chest discomfort or lower extremity swelling Gastrointestinal:  Denies nausea, heartburn or change in bowel habits Skin: Denies abnormal skin rashes Lymphatics: Denies new lymphadenopathy or easy bruising Neurological:Denies numbness, tingling or new weaknesses Behavioral/Psych: Mood is stable, no new changes  All other systems were reviewed with the patient and are negative.   VITALS:  Blood pressure 121/80, pulse (!) 56, temperature (!) 97.4 F (36.3 C), temperature source Tympanic, resp. rate 18, weight (!) 319 lb (144.7 kg), SpO2 100%.  Wt Readings from Last 3 Encounters:  02/17/24 (!) 319 lb (144.7 kg)  10/28/23 (!) 313 lb 0.9 oz (142 kg)  10/22/23 (!) 307 lb 12.8 oz (139.6 kg)    Body mass index is 37.83 kg/m.  Performance status (ECOG): 1 - Symptomatic but completely ambulatory  PHYSICAL EXAM:   GENERAL:alert, no distress and comfortable SKIN: skin color, texture, turgor are normal, no rashes or significant lesions LUNGS: clear to auscultation and percussion with normal breathing effort HEART: regular rate & rhythm and no murmurs and no lower extremity edema ABDOMEN:abdomen soft, non-tender and normal bowel sounds Musculoskeletal:no cyanosis of digits and no clubbing  NEURO: alert & oriented x 3 with fluent speech  LABORATORY DATA:  I have reviewed the data as listed  Lab Results  Component Value Date   WBC 4.6 02/10/2024   NEUTROABS 2.1 02/10/2024   HGB 14.0 02/10/2024   HCT 42.7 02/10/2024   MCV 98.2 02/10/2024   PLT 118 (L) 02/10/2024     Chemistry       Component Value Date/Time   NA 137 02/10/2024 1025   NA 138 08/15/2019 1029   K 5.1 02/10/2024 1025   CL 104 02/10/2024 1025   CO2 28 02/10/2024 1025   BUN 16 02/10/2024 1025   BUN 9 08/15/2019 1029   CREATININE 1.07 02/10/2024 1025      Component Value Date/Time   CALCIUM 9.1 02/10/2024 1025   ALKPHOS 115 02/10/2024 1025   AST 30 02/10/2024 1025   ALT 16 02/10/2024 1025   BILITOT 0.8 02/10/2024 1025   BILITOT 1.1 08/15/2019 1029       Latest Reference Range & Units 02/10/24 10:25  Chromogranin A (ng/mL) 0.0 - 101.8 ng/mL 217.8 (H)  (H): Data is abnormally high  RADIOGRAPHIC STUDIES: MRI abdomen  reviewed under media

## 2024-02-18 ENCOUNTER — Encounter (HOSPITAL_COMMUNITY): Payer: Self-pay | Admitting: Oncology

## 2024-03-14 ENCOUNTER — Encounter: Payer: Self-pay | Admitting: Gastroenterology

## 2024-03-16 ENCOUNTER — Inpatient Hospital Stay: Attending: Hematology

## 2024-03-16 VITALS — BP 118/84 | HR 71 | Temp 98.4°F | Resp 16

## 2024-03-16 DIAGNOSIS — C7A098 Malignant carcinoid tumors of other sites: Secondary | ICD-10-CM | POA: Insufficient documentation

## 2024-03-16 DIAGNOSIS — C7A00 Malignant carcinoid tumor of unspecified site: Secondary | ICD-10-CM

## 2024-03-16 MED ORDER — LANREOTIDE ACETATE 120 MG/0.5ML ~~LOC~~ SOLN
120.0000 mg | Freq: Once | SUBCUTANEOUS | Status: AC
  Administered 2024-03-16: 120 mg via SUBCUTANEOUS

## 2024-03-16 NOTE — Progress Notes (Signed)
Nicolas [name] presents today for Lanreotide injection per the provider's orders.  Stable during administration without incident; injection site WNL; see MAR for injection details.  Patient tolerated procedure well and without incident.  No questions or complaints noted at this time.  

## 2024-03-16 NOTE — Patient Instructions (Signed)
 [facility] [place] [facility]  Discharge Instructions: Thank you for choosing [facility] to provide your oncology and hematology care.  If you have a lab appointment with the Cancer Center - please note that after April 8th, 2024, all labs will be drawn in the cancer center.  You do not have to check in or register with the main entrance as you have in the past but will complete your check-in in the cancer center.  Wear comfortable clothing and clothing appropriate for easy access to any Portacath or PICC line.   We strive to give you quality time with your provider. You may need to reschedule your appointment if you arrive late (15 or more minutes).  Arriving late affects you and other patients whose appointments are after yours.  Also, if you miss three or more appointments without notifying the office, you may be dismissed from the clinic at the provider's discretion.      For prescription refill requests, have your pharmacy contact our office and allow 72 hours for refills to be completed.    Today you received the following chemotherapy and/or immunotherapy agents Lanreotide      To help prevent nausea and vomiting after your treatment, we encourage you to take your nausea medication as directed.  BELOW ARE SYMPTOMS THAT SHOULD BE REPORTED IMMEDIATELY: *FEVER GREATER THAN 100.4 F (38 C) OR HIGHER *CHILLS OR SWEATING *NAUSEA AND VOMITING THAT IS NOT CONTROLLED WITH YOUR NAUSEA MEDICATION *UNUSUAL SHORTNESS OF BREATH *UNUSUAL BRUISING OR BLEEDING *URINARY PROBLEMS (pain or burning when urinating, or frequent urination) *BOWEL PROBLEMS (unusual diarrhea, constipation, pain near the anus) TENDERNESS IN MOUTH AND THROAT WITH OR WITHOUT PRESENCE OF ULCERS (sore throat, sores in mouth, or a toothache) UNUSUAL RASH, SWELLING OR PAIN  UNUSUAL VAGINAL DISCHARGE OR ITCHING   Items with * indicate a potential emergency and should be followed  up as soon as possible or go to the Emergency Department if any problems should occur.  Please show the CHEMOTHERAPY ALERT CARD or IMMUNOTHERAPY ALERT CARD at check-in to the Emergency Department and triage nurse.  Should you have questions after your visit or need to cancel or reschedule your appointment, please contact [facility] [place] - A DEPT OF [facility] [phone]  and follow the prompts.  Office hours are 8:00 a.m. to 4:30 p.m. Monday - Friday. Please note that voicemails left after 4:00 p.m. may not be returned until the following business day.  We are closed weekends and major holidays. You have access to a nurse at all times for urgent questions. Please call the main number to the clinic [phone] and follow the prompts.  For any non-urgent questions, you may also contact your provider using MyChart. We now offer e-Visits for anyone 62 and older to request care online for non-urgent symptoms. For details visit [email or web address].   Also download the MyChart app! Go to the app store, search "MyChart", open the app, select , and log in with your MyChart username and password.

## 2024-04-10 ENCOUNTER — Encounter: Payer: Self-pay | Admitting: *Deleted

## 2024-04-14 ENCOUNTER — Inpatient Hospital Stay: Attending: Hematology

## 2024-04-14 ENCOUNTER — Encounter (HOSPITAL_COMMUNITY): Payer: Self-pay | Admitting: Oncology

## 2024-04-14 VITALS — BP 126/77 | HR 82 | Temp 97.1°F | Resp 20

## 2024-04-14 DIAGNOSIS — C7A098 Malignant carcinoid tumors of other sites: Secondary | ICD-10-CM | POA: Insufficient documentation

## 2024-04-14 DIAGNOSIS — C7A00 Malignant carcinoid tumor of unspecified site: Secondary | ICD-10-CM

## 2024-04-14 MED ORDER — LANREOTIDE ACETATE 120 MG/0.5ML ~~LOC~~ SOLN
120.0000 mg | Freq: Once | SUBCUTANEOUS | Status: AC
  Administered 2024-04-14: 120 mg via SUBCUTANEOUS
  Filled 2024-04-14: qty 0.5

## 2024-04-14 NOTE — Patient Instructions (Signed)
 Lanreotide Injection What is this medication? LANREOTIDE (lan REE oh tide) treats high levels of growth hormone (acromegaly). It is used when other therapies have not worked well enough or cannot be tolerated. It works by reducing the amount of growth hormone your body makes. This reduces symptoms and the risk of health problems caused by too much growth hormone, such as diabetes and heart disease. It may also be used to treat neuroendocrine tumors, a cancer of the cells that release hormones and other substances in your body. It works by slowing down the release of these substances from the cells. This slows tumor growth. It also decreases the symptoms of carcinoid syndrome, such as flushing or diarrhea. This medicine may be used for other purposes; ask your health care provider or pharmacist if you have questions. COMMON BRAND NAME(S): Somatuline Depot What should I tell my care team before I take this medication? They need to know if you have any of these conditions: Diabetes Gallbladder disease Heart disease Kidney disease Liver disease Pancreatic disease Thyroid disease An unusual or allergic reaction to lanreotide, other medications, foods, dyes, or preservatives Pregnant or trying to get pregnant Breastfeeding How should I use this medication? This medication is injected under the skin. It is given by your care team in a hospital or clinic setting. Talk to your care team about the use of this medication in children. Special care may be needed. Overdosage: If you think you have taken too much of this medicine contact a poison control center or emergency room at once. NOTE: This medicine is only for you. Do not share this medicine with others. What if I miss a dose? Keep appointments for follow-up doses. It is important not to miss your dose. Call your care team if you are unable to keep an appointment. What may interact with this medication? Bromocriptine Cyclosporine Certain  medications for blood pressure, heart disease, irregular heartbeat Certain medications for diabetes Quinidine Terfenadine This list may not describe all possible interactions. Give your health care provider a list of all the medicines, herbs, non-prescription drugs, or dietary supplements you use. Also tell them if you smoke, drink alcohol, or use illegal drugs. Some items may interact with your medicine. What should I watch for while using this medication? Visit your care team for regular checks on your progress. Tell your care team if your symptoms do not start to get better or if they get worse. Your condition will be monitored carefully while you are receiving this medication. You may need blood work while you are taking this medication. This medication may increase blood sugar. The risk may be higher in patients who already have diabetes. Ask your care team what you can do to lower your risk of diabetes while taking this medication. Talk to your care team if you wish to become pregnant or think you may be pregnant. This medication can cause serious birth defects. Do not breast-feed while taking this medication and for 6 months after stopping therapy. This medication may cause infertility. Talk to your care team if you are concerned about your fertility. What side effects may I notice from receiving this medication? Side effects that you should report to your care team as soon as possible: Allergic reactions--skin rash, itching, hives, swelling of the face, lips, tongue, or throat Gallbladder problems--severe stomach pain, nausea, vomiting, fever High blood sugar (hyperglycemia)--increased thirst or amount of urine, unusual weakness or fatigue, blurry vision Increase in blood pressure Low blood sugar (hypoglycemia)--pale, blue or purple  skin or lips, sweating, fussiness, rapid heartbeat, poor feeding, low body temperature Low thyroid levels (hypothyroidism)--unusual weakness or fatigue,  increased sensitivity to cold, constipation, hair loss, dry skin, weight gain, feelings of depression Oily or light-colored stools, diarrhea, bloating, weight loss Slow heartbeat--dizziness, feeling faint or lightheaded, confusion, trouble breathing, unusual weakness or fatigue Side effects that usually do not require medical attention (report these to your care team if they continue or are bothersome): Diarrhea Dizziness Headache Muscle spasms Nausea Pain, redness, or irritation at injection site Stomach pain This list may not describe all possible side effects. Call your doctor for medical advice about side effects. You may report side effects to FDA at 1-800-FDA-1088. Where should I keep my medication? This medication is given in a hospital or clinic. It will not be stored at home. NOTE: This sheet is a summary. It may not cover all possible information. If you have questions about this medicine, talk to your doctor, pharmacist, or health care provider.  2024 Elsevier/Gold Standard (2023-03-12 00:00:00)

## 2024-04-14 NOTE — Progress Notes (Signed)
 Patient tolerated Lanreotide injection with no complaints voiced.  Patient stable during and after injection.  See MAR for details.  Site clean and dry with no bruising or swelling noted at site.  Band aid applied.  Vss with discharge and left in satisfactory condition with no s/s of distress noted.  All follow ups as scheduled.   Nicolas [name]

## 2024-04-18 ENCOUNTER — Encounter: Payer: Self-pay | Admitting: Gastroenterology

## 2024-05-05 ENCOUNTER — Encounter (HOSPITAL_COMMUNITY): Payer: Self-pay | Admitting: Oncology

## 2024-05-12 ENCOUNTER — Inpatient Hospital Stay

## 2024-05-17 ENCOUNTER — Inpatient Hospital Stay

## 2024-05-19 ENCOUNTER — Inpatient Hospital Stay

## 2024-05-19 ENCOUNTER — Inpatient Hospital Stay: Attending: Hematology | Admitting: Oncology

## 2024-05-19 DIAGNOSIS — C7A00 Malignant carcinoid tumor of unspecified site: Secondary | ICD-10-CM

## 2024-05-19 DIAGNOSIS — K7460 Unspecified cirrhosis of liver: Secondary | ICD-10-CM

## 2024-05-19 LAB — CBC WITH DIFFERENTIAL/PLATELET
Abs Immature Granulocytes: 0.01 10*3/uL (ref 0.00–0.07)
Basophils Absolute: 0 10*3/uL (ref 0.0–0.1)
Basophils Relative: 1 %
Eosinophils Absolute: 0.2 10*3/uL (ref 0.0–0.5)
Eosinophils Relative: 5 %
HCT: 41.1 % (ref 39.0–52.0)
Hemoglobin: 13.5 g/dL (ref 13.0–17.0)
Immature Granulocytes: 0 %
Lymphocytes Relative: 25 %
Lymphs Abs: 1.2 10*3/uL (ref 0.7–4.0)
MCH: 32.5 pg (ref 26.0–34.0)
MCHC: 32.8 g/dL (ref 30.0–36.0)
MCV: 98.8 fL (ref 80.0–100.0)
Monocytes Absolute: 0.7 10*3/uL (ref 0.1–1.0)
Monocytes Relative: 13 %
Neutro Abs: 2.9 10*3/uL (ref 1.7–7.7)
Neutrophils Relative %: 56 %
Platelets: 133 10*3/uL — ABNORMAL LOW (ref 150–400)
RBC: 4.16 MIL/uL — ABNORMAL LOW (ref 4.22–5.81)
RDW: 14 % (ref 11.5–15.5)
WBC: 5.1 10*3/uL (ref 4.0–10.5)
nRBC: 0 % (ref 0.0–0.2)

## 2024-05-19 LAB — COMPREHENSIVE METABOLIC PANEL WITH GFR
ALT: 27 U/L (ref 0–44)
AST: 41 U/L (ref 15–41)
Albumin: 3.3 g/dL — ABNORMAL LOW (ref 3.5–5.0)
Alkaline Phosphatase: 124 U/L (ref 38–126)
Anion gap: 11 (ref 5–15)
BUN: 16 mg/dL (ref 6–20)
CO2: 27 mmol/L (ref 22–32)
Calcium: 9 mg/dL (ref 8.9–10.3)
Chloride: 105 mmol/L (ref 98–111)
Creatinine, Ser: 1.28 mg/dL — ABNORMAL HIGH (ref 0.61–1.24)
GFR, Estimated: >60 mL/min
Glucose, Bld: 105 mg/dL — ABNORMAL HIGH (ref 70–99)
Potassium: 4.2 mmol/L (ref 3.5–5.1)
Sodium: 143 mmol/L (ref 135–145)
Total Bilirubin: 1.5 mg/dL — ABNORMAL HIGH (ref 0.0–1.2)
Total Protein: 8.3 g/dL — ABNORMAL HIGH (ref 6.5–8.1)

## 2024-05-19 LAB — FOLATE: Folate: 16.3 ng/mL

## 2024-05-19 LAB — IRON AND TIBC
Iron: 108 ug/dL (ref 45–182)
Saturation Ratios: 51 % — ABNORMAL HIGH (ref 17.9–39.5)
TIBC: 210 ug/dL — ABNORMAL LOW (ref 250–450)
UIBC: 102 ug/dL

## 2024-05-19 LAB — VITAMIN B12: Vitamin B-12: 995 pg/mL — ABNORMAL HIGH (ref 180–914)

## 2024-05-19 LAB — FERRITIN: Ferritin: 244 ng/mL (ref 24–336)

## 2024-05-19 MED ORDER — PREDNISONE 50 MG PO TABS: ORAL_TABLET | ORAL | 0 refills | Status: AC

## 2024-05-19 MED ORDER — ACETAMINOPHEN 325 MG PO TABS
650.0000 mg | ORAL_TABLET | Freq: Once | ORAL | Status: AC
  Administered 2024-05-19: 650 mg via ORAL
  Filled 2024-05-19: qty 2

## 2024-05-19 MED ORDER — LANREOTIDE ACETATE 120 MG/0.5ML ~~LOC~~ SOLN
120.0000 mg | Freq: Once | SUBCUTANEOUS | Status: AC
  Administered 2024-05-19: 120 mg via SUBCUTANEOUS
  Filled 2024-05-19: qty 0.5

## 2024-05-19 MED ORDER — CETIRIZINE HCL 10 MG PO TABS
10.0000 mg | ORAL_TABLET | Freq: Once | ORAL | Status: AC
  Administered 2024-05-19: 10 mg via ORAL
  Filled 2024-05-19: qty 1

## 2024-05-19 MED ORDER — FAMOTIDINE 20 MG PO TABS
20.0000 mg | ORAL_TABLET | Freq: Once | ORAL | Status: AC
  Administered 2024-05-19: 20 mg via ORAL
  Filled 2024-05-19: qty 1

## 2024-05-19 MED ORDER — SPIRONOLACTONE 100 MG PO TABS: ORAL_TABLET | ORAL | 2 refills | Status: AC

## 2024-05-19 NOTE — Progress Notes (Signed)
 Per medications entered by treatment team. Pt educated on premedications. Patient tolerated Lanreotide injection with no complaints voiced.  Patient stable during and after injection.  See MAR for details.  Site clean and dry with no bruising or swelling noted at site.  Band aid applied.  Vss with discharge and left in satisfactory condition with no s/s of distress noted. RN educated patient on the importance of notifying the clinic if any complications occurs, pt verbalized understanding.  All follow ups as scheduled.   Nicolas [name]

## 2024-05-19 NOTE — Progress Notes (Signed)
 Add the following premedication to Somatuline plan:  Pepcid  20 mg orally x 1 Tylenol  650 mg orally x 1 Cetirizine  10 mg orally x 1  V.O. Delon Hope, NP/Byanka Landrus Joshua, PharmD

## 2024-05-19 NOTE — Patient Instructions (Signed)
 Lanreotide Injection What is this medication? LANREOTIDE (lan REE oh tide) treats high levels of growth hormone (acromegaly). It is used when other therapies have not worked well enough or cannot be tolerated. It works by reducing the amount of growth hormone your body makes. This reduces symptoms and the risk of health problems caused by too much growth hormone, such as diabetes and heart disease. It may also be used to treat neuroendocrine tumors, a cancer of the cells that release hormones and other substances in your body. It works by slowing down the release of these substances from the cells. This slows tumor growth. It also decreases the symptoms of carcinoid syndrome, such as flushing or diarrhea. This medicine may be used for other purposes; ask your health care provider or pharmacist if you have questions. COMMON BRAND NAME(S): Somatuline Depot What should I tell my care team before I take this medication? They need to know if you have any of these conditions: Diabetes Gallbladder disease Heart disease Kidney disease Liver disease Pancreatic disease Thyroid disease An unusual or allergic reaction to lanreotide, other medications, foods, dyes, or preservatives Pregnant or trying to get pregnant Breastfeeding How should I use this medication? This medication is injected under the skin. It is given by your care team in a hospital or clinic setting. Talk to your care team about the use of this medication in children. Special care may be needed. Overdosage: If you think you have taken too much of this medicine contact a poison control center or emergency room at once. NOTE: This medicine is only for you. Do not share this medicine with others. What if I miss a dose? Keep appointments for follow-up doses. It is important not to miss your dose. Call your care team if you are unable to keep an appointment. What may interact with this medication? Bromocriptine Cyclosporine Certain  medications for blood pressure, heart disease, irregular heartbeat Certain medications for diabetes Quinidine Terfenadine This list may not describe all possible interactions. Give your health care provider a list of all the medicines, herbs, non-prescription drugs, or dietary supplements you use. Also tell them if you smoke, drink alcohol, or use illegal drugs. Some items may interact with your medicine. What should I watch for while using this medication? Visit your care team for regular checks on your progress. Tell your care team if your symptoms do not start to get better or if they get worse. Your condition will be monitored carefully while you are receiving this medication. You may need blood work while you are taking this medication. This medication may increase blood sugar. The risk may be higher in patients who already have diabetes. Ask your care team what you can do to lower your risk of diabetes while taking this medication. Talk to your care team if you wish to become pregnant or think you may be pregnant. This medication can cause serious birth defects. Do not breast-feed while taking this medication and for 6 months after stopping therapy. This medication may cause infertility. Talk to your care team if you are concerned about your fertility. What side effects may I notice from receiving this medication? Side effects that you should report to your care team as soon as possible: Allergic reactions--skin rash, itching, hives, swelling of the face, lips, tongue, or throat Gallbladder problems--severe stomach pain, nausea, vomiting, fever High blood sugar (hyperglycemia)--increased thirst or amount of urine, unusual weakness or fatigue, blurry vision Increase in blood pressure Low blood sugar (hypoglycemia)--pale, blue or purple  skin or lips, sweating, fussiness, rapid heartbeat, poor feeding, low body temperature Low thyroid levels (hypothyroidism)--unusual weakness or fatigue,  increased sensitivity to cold, constipation, hair loss, dry skin, weight gain, feelings of depression Oily or light-colored stools, diarrhea, bloating, weight loss Slow heartbeat--dizziness, feeling faint or lightheaded, confusion, trouble breathing, unusual weakness or fatigue Side effects that usually do not require medical attention (report these to your care team if they continue or are bothersome): Diarrhea Dizziness Headache Muscle spasms Nausea Pain, redness, or irritation at injection site Stomach pain This list may not describe all possible side effects. Call your doctor for medical advice about side effects. You may report side effects to FDA at 1-800-FDA-1088. Where should I keep my medication? This medication is given in a hospital or clinic. It will not be stored at home. NOTE: This sheet is a summary. It may not cover all possible information. If you have questions about this medicine, talk to your doctor, pharmacist, or health care provider.  2024 Elsevier/Gold Standard (2023-03-12 00:00:00)

## 2024-05-19 NOTE — Progress Notes (Signed)
 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 male was transferred to my care after his prior physician has left.   ASSESSMENT & PLAN:   Assessment & Plan: Nicolas [name]  is a 59 y.o. male with well differentiated neuroendocrine tumor  Assessment and Plan Assessment & Plan Well-differentiated neuroendocrine tumor Biopsy-proven via mesenteric mass biopsy at [name] in 2021 Extensive oncology history below Currently on lanreotide injections monthly  - Last MRI from 10/2023 reviewed.  Hepatic cirrhosis with small ascites present.  Posterior superior right hepatic hyperintense lesion, likely dysplastic nodule. - Continue lanreotide 120 mg injections monthly -Experienced side effects about 1 hour following his lanreotide injection last month. -Discussed with pharmacy who recommended premedications with Zyrtec , Pepcid  and Tylenol . -Patient to let us  know if symptoms/side effects improve following today's injection. -Chromogranin A level at 217,  elevated but stable. - Patient did not have CT scan due to weather.  We will get this rescheduled today. - Patient requires premedications with prednisone  high dose prior to CT scan.  New Rx sent to pharmacy for him to take prior to CT scan.  Return to clinic in 3 months with scan   Cirrhosis of liver with ascites Chronic cirrhosis with mild hyperbilirubinemia and ascites. Total bilirubin mildly elevated, well-managed. Increased abdominal girth and weight gain without new or worsening ascites. Pain attributed to trauma, not hepatic etiology. On diuretic therapy for fluid management. - Ordered CT scan of abdomen to evaluate ascites. - Sent prescription  for diuretic refill to pharmacy for continued management of ascites and edema. - Reviewed recent labs; will monitor liver function and total bilirubin.  Adverse effects of lanreotide therapy Significant adverse effects post-lanreotide injection, resolved within 24 hours. Symptoms suspected related to lanreotide. No previous similar reactions despite long-term therapy. Reports chronic injection site discomfort and gastrointestinal upset if eating prior to injection. - Administered premedication (Pepcid , Zyrtec , Tylenol ) prior to lanreotide injection to mitigate adverse effects. Tylenol  at prescribed dose not expected to affect liver function given infrequent use. - Instructed him to call clinic if premedication does not prevent symptoms or if adverse effects recur. - Coordinated with nursing staff to administer lanreotide injection on left side and provide premedications without prolonged observation post-injection. - Discussed option to switch therapy if premedication is ineffective.   The patient understands the plans discussed today and is in agreement with them.  He knows to contact our office if he develops concerns prior to his next appointment.  30 minutes of total time was spent for this patient encounter, including preparation,review of records,  face-to-face counseling with the patient and coordination of care, physical exam, and documentation of the encounter.   Nicolas FORBES Hope, NP  [name] - A DEPT OF Nicolas [name] [address] [place] [address] Dept: [phone] Dept Fax: [phone]   Orders Placed This Encounter  Procedures   CT ABDOMEN PELVIS W CONTRAST    Standing Status:   Future    Expected Date:   06/16/2024    Expiration Date:   05/19/2025    If indicated for the ordered procedure, I authorize the administration of contrast media per Radiology protocol:   Yes  Does the patient have a contrast  media/X-ray dye allergy?:   Yes    Preferred imaging location?:   [facility]    If indicated for the ordered procedure, I authorize the administration of oral contrast media per Radiology protocol:   Yes     ONCOLOGY HISTORY:   I have reviewed his chart and materials related to his cancer extensively and collaborated history with the patient. 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 Large volume ascites throughout the abdomen. Possible mild nodular infiltration of the left upper quadrant mesentery and anterior omentum. 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  -09/05/2019: PET: Solid mesenteric mass right of midline abdomen worrisome for neoplastic process with increase activity. No definite additional metabolically active lesions identified. The liver is cirrhotic with splenomegaly. Diffusely elevated hepatic activity which is nonspecific.  -10/09/2019: Mesenteric mass biopsy.   Pathology: Well differentiated neuroendocrine tumor (carcinoid tumor). The lesional cells are strongly positive for chromogranin and synaptophysin. -11/08/2019: Gallium Dotatate PET: Large solid mesenteric mass midline abdomen with increased activity redemonstrated. Heterogenous cirrhotic appearing liver, difficult to exclude small metastatic lesion. Findings suspicious for some increased activity in the uncinate process of the pancreas and a small lesion cannot be excluded.  -11/13/2019-current: Lanreotide injections -05/20/2020: Chromogranin A 184.9 -07/22/2020: Chromogranin A 215.5 -2022-03/2023: Stable to improved disease -2022-current: Chromogranin levels have remained relative stable ranging from the mid 100's to the lower 200's -04/05/2023: CT AP: New homogenous focus of hyper enhancement within  the hepatic dome. Stable soft tissue mass along the mesenteric root.  -08/18/2023-current: Stable disease, hepatic cirrhosis with small enhancing mass noted on scans -11/10/2023: MRI Abdomen: Posterior superior right hepatic hyperintense lesion, likely dysplastic nodule. No definite washout to confirm HCC.   Current Treatment:  Monthly Lanreotide injections  INTERVAL HISTORY:   Discussed the use of AI scribe software for clinical note transcription with the patient, who gave verbal consent to proceed.  History of Present Illness Nicolas [name] is a 59 year old male with cirrhosis and ascites who presents for evaluation of severe adverse effects following lanreotide injection and management of fluid retention.  Following his most recent lanreotide injection, he developed severe symptoms including intense headache, fever, dizziness, hallucinations, insomnia, and diffuse pain, describing the sensation as feeling like being in an earthquake. These symptoms began after the injection and persisted for approximately one day before resolving. He has been receiving lanreotide injections for an extended period, and this is the first occurrence of such a reaction. He has not yet received his injection today.  He notes that injection site influences his discomfort, with increased pain when administered on the left side compared to the right. He also experiences significant gastrointestinal upset, including vomiting, if he eats prior to receiving the injection, a pattern that has persisted for years. No new medications were started around the time of the reaction, and he does not routinely take medications unless necessary. He expresses concern about acetaminophen  use due to underlying liver disease but does not use it regularly.  He has experienced significant weight gain, now up to 337 pounds from below 300 pounds previously, despite no increase in food intake. He describes abdominal discomfort. He has run  out of his prescribed diuretic for fluid management.  Approximately one week ago, he sustained a fall on ice, landing on his back,  resulting in ongoing back and abdominal pain, which he attributes to the fall rather than his underlying liver disease. He has been largely housebound for the past three weeks due to inclement weather, limiting his mobility and access to care.  A previously ordered CT scan to evaluate his abdominal status has not yet been completed due to weather-related travel difficulties.   I have reviewed the past medical history, past surgical history, social history and family history with the patient and they are unchanged from previous note.  ALLERGIES:  is allergic to iodinated contrast media.  MEDICATIONS:  Current Outpatient Medications  Medication Sig Dispense Refill   clotrimazole  (LOTRIMIN ) 1 % cream Apply 1 application topically 2 (two) times daily. 30 g 3   clotrimazole -betamethasone  (LOTRISONE ) cream Apply 1 Application topically 2 (two) times daily. 30 g 0   diphenoxylate -atropine  (LOMOTIL ) 2.5-0.025 MG tablet Take 2 tablets after first watery stool, then 1 tablet after each watery stool.  No more than 8 tablets daily. 60 tablet 3   Ensure (ENSURE) Take 237 mLs by mouth every evening.     furosemide  (LASIX ) 40 MG tablet Take 1 tablet (40 mg total) by mouth daily. (Patient taking differently: Take 40 mg by mouth daily. prn) 30 tablet 5   pantoprazole  (PROTONIX ) 40 MG tablet Take 1 tablet (40 mg total) by mouth daily. (Patient taking differently: Take 40 mg by mouth daily. As needed.) 30 tablet 3   predniSONE  (DELTASONE ) 50 MG tablet Take 50 mg by mouth 13 hours, 7 hours, and 1 hour prior to CT scan 3 tablet 0   propranolol (INDERAL) 20 MG tablet Take 1 tablet by mouth daily.     sildenafil (VIAGRA) 50 MG tablet Take by mouth.     spironolactone  (ALDACTONE ) 100 MG tablet Take one tablet in AM 60 tablet 2   No current facility-administered medications for this  visit.   Facility-Administered Medications Ordered in Other Visits  Medication Dose Route Frequency Provider Last Rate Last Admin   acetaminophen  (TYLENOL ) tablet 650 mg  650 mg Oral Once Shonica Weier E, NP       cetirizine  (ZYRTEC ) tablet 10 mg  10 mg Oral Once Hussein Macdougal E, NP       epoetin  alfa-epbx (RETACRIT ) 20000 UNIT/ML injection            epoetin  alfa-epbx (RETACRIT ) 40000 UNIT/ML injection            famotidine  (PEPCID ) tablet 20 mg  20 mg Oral Once Anuradha Chabot E, NP       lanreotide acetate  (SOMATULINE DEPOT ) 120 MG/0.5ML injection            lanreotide acetate  (SOMATULINE DEPOT ) injection 120 mg  120 mg Subcutaneous Once Kandala, Hyndavi, MD        REVIEW OF SYSTEMS:   Review of Systems  Constitutional:  Positive for malaise/fatigue. Negative for weight loss.  Respiratory:  Positive for shortness of breath.   Cardiovascular:  Positive for chest pain.  Gastrointestinal:  Positive for nausea.  Neurological:  Positive for dizziness and headaches.  Psychiatric/Behavioral:  The patient is nervous/anxious.       VITALS:  Blood pressure 116/78, pulse 73, temperature 97.9 F (36.6 C), temperature source Oral, resp. rate 18, weight (!) 337 lb 11.9 oz (153.2 kg), SpO2 98%.  Wt Readings from Last 3 Encounters:  05/19/24 (!) 337 lb 11.9 oz (153.2 kg)  02/17/24 (!) 319 lb (144.7 kg)  10/28/23 (!) 313 lb 0.9 oz (142 kg)  Body mass index is 40.05 kg/m.  Performance status (ECOG): 1 - Symptomatic but completely ambulatory  PHYSICAL EXAM:   GENERAL:alert, no distress and comfortable SKIN: skin color, texture, turgor are normal, no rashes or significant lesions LUNGS: clear to auscultation and percussion with normal breathing effort HEART: regular rate & rhythm and no murmurs and no lower extremity edema ABDOMEN:abdomen soft, non-tender and normal bowel sounds Musculoskeletal:no cyanosis of digits and no clubbing  NEURO: alert & oriented x 3 with fluent  speech  LABORATORY DATA:  I have reviewed the data as listed  Lab Results  Component Value Date   WBC 5.1 05/19/2024   NEUTROABS 2.9 05/19/2024   HGB 13.5 05/19/2024   HCT 41.1 05/19/2024   MCV 98.8 05/19/2024   PLT 133 (L) 05/19/2024     Chemistry      Component Value Date/Time   NA 143 05/19/2024 1101   NA 138 08/15/2019 1029   K 4.2 05/19/2024 1101   CL 105 05/19/2024 1101   CO2 27 05/19/2024 1101   BUN 16 05/19/2024 1101   BUN 9 08/15/2019 1029   CREATININE 1.28 (H) 05/19/2024 1101      Component Value Date/Time   CALCIUM 9.0 05/19/2024 1101   ALKPHOS 124 05/19/2024 1101   AST 41 05/19/2024 1101   ALT 27 05/19/2024 1101   BILITOT 1.5 (H) 05/19/2024 1101   BILITOT 1.1 08/15/2019 1029       Latest Reference Range & Units 02/10/24 10:25  Chromogranin A (ng/mL) 0.0 - 101.8 ng/mL 217.8 (H)  (H): Data is abnormally high  RADIOGRAPHIC STUDIES: MRI abdomen  reviewed under media  "

## 2024-05-30 ENCOUNTER — Ambulatory Visit (HOSPITAL_COMMUNITY)

## 2024-06-16 ENCOUNTER — Inpatient Hospital Stay

## 2024-07-14 ENCOUNTER — Inpatient Hospital Stay

## 2024-08-14 ENCOUNTER — Inpatient Hospital Stay

## 2024-08-14 ENCOUNTER — Inpatient Hospital Stay: Admitting: Oncology
